# Patient Record
Sex: Female | Born: 1985 | Race: White | Hispanic: No | Marital: Married | Smoking: Never smoker
Health system: Southern US, Community
[De-identification: ages and names within clinical notes are randomized; demographics above are authoritative.]

## PROBLEM LIST (undated history)

## (undated) ENCOUNTER — Ambulatory Visit

## (undated) ENCOUNTER — Encounter

## (undated) ENCOUNTER — Ambulatory Visit: Payer: PRIVATE HEALTH INSURANCE

## (undated) ENCOUNTER — Encounter: Attending: Adult Health | Primary: Adult Health

## (undated) ENCOUNTER — Ambulatory Visit
Payer: PRIVATE HEALTH INSURANCE | Attending: Physical Medicine & Rehabilitation | Primary: Physical Medicine & Rehabilitation

## (undated) ENCOUNTER — Telehealth

## (undated) ENCOUNTER — Ambulatory Visit: Payer: PRIVATE HEALTH INSURANCE | Attending: Adult Health | Primary: Adult Health

## (undated) ENCOUNTER — Telehealth: Attending: Hematology & Oncology | Primary: Hematology & Oncology

## (undated) ENCOUNTER — Encounter: Attending: Hematology & Oncology | Primary: Hematology & Oncology

## (undated) ENCOUNTER — Ambulatory Visit: Payer: PRIVATE HEALTH INSURANCE | Attending: Hematology & Oncology | Primary: Hematology & Oncology

## (undated) ENCOUNTER — Telehealth: Attending: Adult Health | Primary: Adult Health

## (undated) ENCOUNTER — Ambulatory Visit: Attending: Internal Medicine | Primary: Internal Medicine

## (undated) ENCOUNTER — Inpatient Hospital Stay (HOSPITAL_COMMUNITY): Payer: Self-pay

## (undated) DIAGNOSIS — D649 Anemia, unspecified: Secondary | ICD-10-CM

## (undated) DIAGNOSIS — R51 Headache: Secondary | ICD-10-CM

## (undated) DIAGNOSIS — J302 Other seasonal allergic rhinitis: Secondary | ICD-10-CM

## (undated) DIAGNOSIS — N809 Endometriosis, unspecified: Secondary | ICD-10-CM

## (undated) DIAGNOSIS — N83209 Unspecified ovarian cyst, unspecified side: Secondary | ICD-10-CM

## (undated) DIAGNOSIS — R222 Localized swelling, mass and lump, trunk: Secondary | ICD-10-CM

## (undated) DIAGNOSIS — R519 Headache, unspecified: Secondary | ICD-10-CM

## (undated) DIAGNOSIS — IMO0001 Reserved for inherently not codable concepts without codable children: Secondary | ICD-10-CM

## (undated) HISTORY — PX: OTHER SURGICAL HISTORY: SHX169

---
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
## 2007-03-09 ENCOUNTER — Other Ambulatory Visit: Admission: RE | Admit: 2007-03-09 | Discharge: 2007-03-09 | Payer: Self-pay | Admitting: Family Medicine

## 2008-02-28 ENCOUNTER — Encounter: Admission: RE | Admit: 2008-02-28 | Discharge: 2008-02-28 | Payer: Self-pay | Admitting: Otolaryngology

## 2010-03-25 ENCOUNTER — Emergency Department (HOSPITAL_BASED_OUTPATIENT_CLINIC_OR_DEPARTMENT_OTHER): Admission: EM | Admit: 2010-03-25 | Discharge: 2010-03-25 | Payer: Self-pay | Admitting: Emergency Medicine

## 2010-12-14 LAB — PREGNANCY, URINE: Preg Test, Ur: NEGATIVE

## 2010-12-14 LAB — URINE MICROSCOPIC-ADD ON

## 2010-12-14 LAB — URINALYSIS, ROUTINE W REFLEX MICROSCOPIC
Bilirubin Urine: NEGATIVE
Nitrite: NEGATIVE
Specific Gravity, Urine: 1.02 (ref 1.005–1.030)
pH: 6 (ref 5.0–8.0)

## 2011-02-12 NOTE — Discharge Summary (Signed)
Mary Cannon, Mary Cannon                           ACCOUNT NO.:  [id number]   MEDICAL RECORD NO.:  [id number]                   PATIENT TYPE:  INP   LOCATION:  0104                                 FACILITY:  [facility]   PHYSICIAN:  Cindie Crumbly, M.D.               DATE OF BIRTH:  1986-09-08   DATE OF ADMISSION:  07/14/2002  DATE OF DISCHARGE:  07/20/2002                                 DISCHARGE SUMMARY   REASON FOR ADMISSION:  This 25 year old white female was admitted for  inpatient psychiatric stabilization status post overdose as a suicide  attempt.  For further history of present illness, please see the patient's  psychiatric admission assessment.   PHYSICAL EXAMINATION:  At the time of admission, was significant for her  being underweight and was, otherwise unremarkable.   LABORATORY DATA:  The patient underwent a laboratory workup to rule out any  medical problems contributing to her symptomatology.  A urine probe for  gonorrhea and Chlamydia were negative.  UA was unremarkable.  Routine chem  panel was within normal limits.  Hepatic panel was within normal limits.  GGT was within normal limits.  CBC showed RBC 3.60, MCV 95.3, MCHC 34.4 and  was, otherwise unremarkable.  TSH and free T4 were within normal limits.  RPR was nonreactive.   The patient received no x-rays, no special procedures, no additional  consultations.  She sustained no complications during the course of this  hospitalization.   HOSPITAL COURSE:  On admission, the patient was psychomotor agitated.  Affect and mood were depressed and irritable.  She displayed poor impulse  control, decreased concentration.  She was begun on a trial of Wellbutrin XL  at 150 mg per day and tolerated this medication well without side effects.  She has been actively participating in all aspects of the therapeutic  treatment program and, at the time of discharge, denies any suicidal or  homicidal ideation, no longer appears to be a  danger to herself or others  and consequently is felt to have reached her maximum benefits of  hospitalization and is ready for discharge to a less restrictive alternative  setting.   CONDITION ON DISCHARGE:  Improved.   DIAGNOSES (ACCORDING TO DSM-IV):   AXIS I:  1. Major depression, single episode, severe without psychosis.  2. 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   AXIS II:  Rule out learning disorder; not otherwise specified.   AXIS III:  None.   AXIS IV:  Severe.   AXIS V:  1. On admission = 20.  2. On discharge = 30.   FURTHER EVALUATION AND TREATMENT RECOMMENDATIONS:  1. The patient is discharged to home.  2. She is discharged on an unrestricted level of activity and a regular     diet.  3. She was discharged on Wellbutrin XL 150 mg p.o. q. a.m.  4. She will follow up with Dr. __________ at the  [facility] for all further aspects of her     psychiatric care and, consequently, I will sign off on the case at this     time.                                               Cindie Crumbly, M.D.    TS/MEDQ  D:  07/20/2002  T:  07/21/2002  Job:  [id number]

## 2011-03-24 ENCOUNTER — Inpatient Hospital Stay (HOSPITAL_COMMUNITY)
Admission: AD | Admit: 2011-03-24 | Discharge: 2011-03-24 | Disposition: A | Discharge status: Home / Self Care | Payer: 59 | Source: Ambulatory Visit | Attending: Obstetrics and Gynecology | Admitting: Obstetrics and Gynecology

## 2011-03-24 DIAGNOSIS — O209 Hemorrhage in early pregnancy, unspecified: Secondary | ICD-10-CM | POA: Insufficient documentation

## 2011-03-24 LAB — URINALYSIS, ROUTINE W REFLEX MICROSCOPIC
Glucose, UA: NEGATIVE mg/dL
Specific Gravity, Urine: 1.025 (ref 1.005–1.030)
Urobilinogen, UA: 0.2 mg/dL (ref 0.0–1.0)

## 2011-03-24 LAB — URINE MICROSCOPIC-ADD ON

## 2011-04-10 ENCOUNTER — Inpatient Hospital Stay (HOSPITAL_COMMUNITY)
Admission: AD | Admit: 2011-04-10 | Discharge: 2011-04-11 | Disposition: A | Discharge status: Home / Self Care | Payer: 59 | Source: Ambulatory Visit | Attending: Obstetrics and Gynecology | Admitting: Obstetrics and Gynecology

## 2011-04-10 ENCOUNTER — Encounter (HOSPITAL_COMMUNITY): Payer: Self-pay | Admitting: *Deleted

## 2011-04-10 DIAGNOSIS — O209 Hemorrhage in early pregnancy, unspecified: Secondary | ICD-10-CM

## 2011-04-10 DIAGNOSIS — Z3490 Encounter for supervision of normal pregnancy, unspecified, unspecified trimester: Secondary | ICD-10-CM

## 2011-04-10 NOTE — Progress Notes (Signed)
Pt G1 at 13wks having vag bleeding that started at 2145.  Bleeding started as bright red now dark red, no clots.  Pt having lower abd tenderness and reports constipation and gas.

## 2011-04-10 NOTE — ED Provider Notes (Signed)
History     Chief Complaint  Patient presents with  . Vaginal Bleeding   HPI G1P0 @ [redacted] wks EGA. Reports gush of vaginal bleeding tonight, has since decreased. Mild cramping, but reports that she has been having this pain with associated constipation. H/o subchorionic hemorrhage in this pregnancy, had similar episode about 2 weeks ago, has been on pelvic rest since.   OB History    Grav Para Term Preterm Abortions TAB SAB Ect Mult Living   1               History reviewed. No pertinent past medical history.  History reviewed. No pertinent past surgical history.  No family history on file.  History  Substance Use Topics  . Smoking status: Never Smoker   . Smokeless tobacco: Not on file  . Alcohol Use: No    Allergies: No Known Allergies  Prescriptions prior to admission  Medication Sig Dispense Refill  . ondansetron (ZOFRAN-ODT) 4 MG disintegrating tablet Take 4 mg by mouth every 8 (eight) hours as needed. nausea       . prenatal vitamin w/FE, FA (NATACHEW) 29-1 MG CHEW Chew 1 tablet by mouth daily.          Review of Systems  Constitutional: Negative.   Respiratory: Negative.   Cardiovascular: Negative.   Gastrointestinal: Positive for abdominal pain and constipation. Negative for diarrhea.  Genitourinary:       + for vaginal bleeding  Neurological: Negative.   Psychiatric/Behavioral: Negative.    Physical Exam   Blood pressure 124/70, pulse 112, temperature 99.2 F (37.3 C), temperature source Oral, resp. rate 16, height 5' 3.5" (1.613 m), weight 74.844 kg (165 lb), last menstrual period 01/09/2011. FHR 150s  Physical Exam  Constitutional: She appears well-developed and well-nourished. No distress.  Cardiovascular: Normal rate.   Respiratory: Effort normal.  GI: Soft. There is no tenderness.  Genitourinary: There is no rash or lesion on the right labia. There is no rash or lesion on the left labia. There is bleeding (moderate) around the vagina.       S=D,  Cervix closed    MAU Course  Procedures Prenatal labs rev'd in [facility] - blood type O pos  Consult with Dr. Rana Snare, d/c pt to home, rec rest, f/u as scheduled or sooner as needed.   A/P: 25 yo G1P0 @ 13 wks with vaginal bleeding. Has appt scheduled on 7/23, will call office or return to MAU with increased bleeding or pain.

## 2011-04-10 NOTE — Progress Notes (Signed)
Pt states, " I felt something run out at 9:30 pm, and I got back home and it was all down my legs and on my pants. M back has been hurting on and off since 6:30 pm, and I keep feeling like Im constipated and have a gasy  feeling in my low abd."

## 2011-04-11 DIAGNOSIS — O209 Hemorrhage in early pregnancy, unspecified: Secondary | ICD-10-CM | POA: Diagnosis present

## 2011-04-11 DIAGNOSIS — Z3490 Encounter for supervision of normal pregnancy, unspecified, unspecified trimester: Secondary | ICD-10-CM

## 2011-04-11 LAB — WET PREP, GENITAL

## 2011-04-13 LAB — GC/CHLAMYDIA PROBE AMP, GENITAL: Chlamydia, DNA Probe: NEGATIVE

## 2011-04-16 ENCOUNTER — Inpatient Hospital Stay (HOSPITAL_COMMUNITY)
Admission: AD | Admit: 2011-04-16 | Discharge: 2011-04-16 | Disposition: A | Discharge status: Home / Self Care | Payer: 59 | Source: Ambulatory Visit | Attending: Obstetrics and Gynecology | Admitting: Obstetrics and Gynecology

## 2011-04-16 ENCOUNTER — Encounter (HOSPITAL_COMMUNITY): Payer: Self-pay | Admitting: *Deleted

## 2011-04-16 ENCOUNTER — Other Ambulatory Visit (HOSPITAL_COMMUNITY): Payer: Self-pay | Admitting: Obstetrics and Gynecology

## 2011-04-16 DIAGNOSIS — O210 Mild hyperemesis gravidarum: Secondary | ICD-10-CM | POA: Insufficient documentation

## 2011-04-16 DIAGNOSIS — R1110 Vomiting, unspecified: Secondary | ICD-10-CM

## 2011-04-16 MED ORDER — ONDANSETRON HCL 4 MG/2ML IJ SOLN
8.0000 mg | Freq: Once | INTRAVENOUS | Status: AC
  Administered 2011-04-16: 8 mg via INTRAVENOUS
  Filled 2011-04-16: qty 4

## 2011-04-16 MED ORDER — ACETAMINOPHEN 325 MG PO TABS: 650.0000 mg | ORAL_TABLET | ORAL | Status: DC | PRN

## 2011-04-16 MED ORDER — PRENATAL PLUS 27-1 MG PO TABS
1.0000 | ORAL_TABLET | Freq: Every day | ORAL | Status: DC
  Filled 2011-04-16 (×2): qty 1

## 2011-04-16 MED ORDER — CALCIUM CARBONATE ANTACID 500 MG PO CHEW
2.0000 | CHEWABLE_TABLET | ORAL | Status: DC | PRN
  Filled 2011-04-16: qty 2

## 2011-04-16 MED ORDER — ZOLPIDEM TARTRATE 10 MG PO TABS: 10.0000 mg | ORAL_TABLET | Freq: Every evening | ORAL | Status: DC | PRN

## 2011-04-16 MED ORDER — ONDANSETRON HCL 4 MG/2ML IJ SOLN
8.0000 mg | Freq: Once | INTRAMUSCULAR | Status: DC
  Filled 2011-04-16: qty 4

## 2011-04-16 MED ORDER — DOCUSATE SODIUM 100 MG PO CAPS
100.0000 mg | ORAL_CAPSULE | Freq: Every day | ORAL | Status: DC
  Filled 2011-04-16 (×2): qty 1

## 2011-04-16 MED ORDER — LACTATED RINGERS IV SOLN: Freq: Once | INTRAVENOUS | Status: DC

## 2011-04-16 NOTE — Progress Notes (Signed)
Pt states that she is no longer nauseated, but every time she eats or drinks she will have vomiting. States she has been seen in MAU with bleeding over the past weekend.

## 2011-04-16 NOTE — ED Provider Notes (Addendum)
History   Patient of Dr Vance Gather. G1P0 now [redacted]w[redacted]d pregant.  With hx of Subchorionic hemorrhage with vaginal bleeding.  Had ultrasound to find out sex at another office in [place] and told her amniotic fluid was low.  She was seen by Dr. Marcelle Overlie today and had Ultrasound in the office.  States the baby was not moving a lot on the ultrasound. Was sent here for hydration.  Instructed to rest and that she would have weekly monitoring.  Has pink spotting.  Denies vomiting since here.  IV is now inserted to begin hydration.  Is comfortable and in no acute distress.  Chief Complaint  Patient presents with  . Emesis   Patient is a 25 y.o. female presenting with vomiting. The history is provided by the patient.  Emesis  This is a chronic problem. The current episode started more than 1 week ago. The problem occurs 2 to 4 times per day.      No past medical history on file.  No past surgical history on file.  No family history on file.  History  Substance Use Topics  . Smoking status: Never Smoker   . Smokeless tobacco: Not on file  . Alcohol Use: No    Allergies: No Known Allergies  Prescriptions prior to admission  Medication Sig Dispense Refill  . ondansetron (ZOFRAN-ODT) 4 MG disintegrating tablet Take 8 mg by mouth every 8 (eight) hours as needed. nausea      . polyethylene glycol (MIRALAX / GLYCOLAX) packet Take 17 g by mouth daily. For constipation       . prenatal vitamin w/FE, FA (NATACHEW) 29-1 MG CHEW Chew 1 tablet by mouth daily.          Review of Systems  Gastrointestinal: Positive for vomiting.   Physical Exam   Blood pressure 124/74, pulse 80, temperature 97.8 F (36.6 C), temperature source Oral, resp. rate 16, height 5\' 3"  (1.6 m), weight 160 lb (72.576 kg), last menstrual period 01/09/2011, SpO2 99.00%.  Physical Exam  MAU Course  Procedures   I liter of LR with Zofran infused.  Patient is doing well.   Neysa Bonito, Rn reported to Dr. Marcelle Overlie that fluids were in.  May  discharge to home.  MDM  Assessment and Plan  Emesis--treated with IV fluids  Discharge to home with instructions to keep scheduled appointment.  To call her doctor for increased nausea/vomiting, vaginal bleeding.  Continue Zofran that she has at home. Seeley Hissong,EVE M 04/16/2011, 3:31 PM   Matt Holmes, NP 05/01/11 1604

## 2011-04-16 NOTE — Progress Notes (Signed)
Pt states "N/V continues, office sent over for IV flds"

## 2011-04-20 ENCOUNTER — Inpatient Hospital Stay (HOSPITAL_COMMUNITY)
Admission: AD | Admit: 2011-04-20 | Discharge: 2011-04-20 | Disposition: A | Discharge status: Home / Self Care | Payer: 59 | Source: Ambulatory Visit | Attending: Obstetrics & Gynecology | Admitting: Obstetrics & Gynecology

## 2011-04-20 ENCOUNTER — Encounter (HOSPITAL_COMMUNITY): Payer: Self-pay | Admitting: Advanced Practice Midwife

## 2011-04-20 DIAGNOSIS — O209 Hemorrhage in early pregnancy, unspecified: Secondary | ICD-10-CM | POA: Insufficient documentation

## 2011-04-20 NOTE — Progress Notes (Signed)
Pt G1 at 14.5wks, using vag suppository for BV.  Brown blood noted on pad this am and something white and thick fell into the toilet.  Pt was instructed by phone nurse to come to MAU.

## 2011-04-20 NOTE — Progress Notes (Signed)
I went to Dr. Friday and was tested for BAV, started taking Clindamycin vaginal cream.  Used suppository and thought it was medicine.  Had brown dc this morning and lost white something with brown blood on it.

## 2011-04-20 NOTE — ED Provider Notes (Signed)
History     Chief Complaint  Patient presents with  . Vaginal Bleeding   HPI 25 y.o. G1P0 at [redacted]w[redacted]d. States she woke up this morning with brown discharge on pad. Went to bathroom and something white came out into toilet. Started using vaginal suppository treatment for BV last night. H/o vaginal bleeding in this pregnancy s/t subchorionic hemorrhage. Reports mild cramping feeling in inguinal area bilaterally.     Past Medical History  Diagnosis Date  . No pertinent past medical history     Past Surgical History  Procedure Date  . No past surgeries     History reviewed. No pertinent family history.  History  Substance Use Topics  . Smoking status: Never Smoker   . Smokeless tobacco: Not on file  . Alcohol Use: No    Allergies:  Allergies  Allergen Reactions  . Sulfa Antibiotics Other (See Comments)    Gets bad yeast infection    Prescriptions prior to admission  Medication Sig Dispense Refill  . clindamycin (CLEOCIN) 2 % vaginal cream Place 1 applicator vaginally at bedtime. For 3 days       . ondansetron (ZOFRAN-ODT) 4 MG disintegrating tablet Take 8 mg by mouth every 8 (eight) hours as needed. nausea      . polyethylene glycol (MIRALAX / GLYCOLAX) packet Take 17 g by mouth daily. For constipation       . prenatal vitamin w/FE, FA (NATACHEW) 29-1 MG CHEW Chew 1 tablet by mouth daily.          ROS Negative except as noted in HPI  Physical Exam   Blood pressure 116/77, pulse 101, temperature 98.8 F (37.1 C), temperature source Oral, resp. rate 16, height 5\' 4"  (1.626 m), weight 159 lb (72.122 kg), last menstrual period 01/09/2011.  Physical Exam  Constitutional: She appears well-developed and well-nourished. No distress.  Cardiovascular: Normal rate.   Respiratory: Effort normal.  GI: Soft. Bowel sounds are normal. She exhibits no distension. There is no tenderness.  Genitourinary: There is no rash or lesion on the right labia. There is no rash or lesion on the  left labia. No bleeding around the vagina. Vaginal discharge (brownish creamy discharge) found.       Cervix posterior, closed, firm    MAU Course  Procedures  Dr. Henderson Cloud informed of patient status, ok to d/c to home  Assessment and Plan  25 y.o. G1P0 [redacted]w[redacted]d Vaginal discharge related to admin of vaginal cream tonight F/U as scheduled or sooner PRN  Jadin Creque 04/19/2011, 5:42 AM

## 2011-04-23 ENCOUNTER — Other Ambulatory Visit (HOSPITAL_COMMUNITY): Payer: Self-pay | Admitting: Obstetrics and Gynecology

## 2011-04-23 DIAGNOSIS — Z3689 Encounter for other specified antenatal screening: Secondary | ICD-10-CM

## 2011-04-29 ENCOUNTER — Other Ambulatory Visit (HOSPITAL_COMMUNITY): Payer: Self-pay | Admitting: Obstetrics and Gynecology

## 2011-04-29 ENCOUNTER — Ambulatory Visit (HOSPITAL_COMMUNITY)
Admission: RE | Admit: 2011-04-29 | Discharge: 2011-04-29 | Disposition: A | Discharge status: Home / Self Care | Payer: 59 | Source: Ambulatory Visit | Attending: Obstetrics and Gynecology | Admitting: Obstetrics and Gynecology

## 2011-04-29 DIAGNOSIS — Z3689 Encounter for other specified antenatal screening: Secondary | ICD-10-CM

## 2011-04-29 DIAGNOSIS — O4100X0 Oligohydramnios, unspecified trimester, not applicable or unspecified: Secondary | ICD-10-CM | POA: Insufficient documentation

## 2011-04-29 DIAGNOSIS — O200 Threatened abortion: Secondary | ICD-10-CM | POA: Insufficient documentation

## 2011-04-29 NOTE — Progress Notes (Signed)
Report in [facility]; follow-up U/S 3 weeks

## 2011-05-20 ENCOUNTER — Ambulatory Visit (HOSPITAL_COMMUNITY)
Admission: RE | Admit: 2011-05-20 | Discharge: 2011-05-20 | Disposition: A | Discharge status: Home / Self Care | Payer: 59 | Source: Ambulatory Visit | Attending: Obstetrics and Gynecology | Admitting: Obstetrics and Gynecology

## 2011-05-20 ENCOUNTER — Other Ambulatory Visit (HOSPITAL_COMMUNITY): Payer: Self-pay | Admitting: Obstetrics and Gynecology

## 2011-05-20 DIAGNOSIS — O4100X0 Oligohydramnios, unspecified trimester, not applicable or unspecified: Secondary | ICD-10-CM

## 2011-05-20 DIAGNOSIS — Z3689 Encounter for other specified antenatal screening: Secondary | ICD-10-CM | POA: Insufficient documentation

## 2011-05-28 ENCOUNTER — Encounter (HOSPITAL_COMMUNITY): Payer: Self-pay

## 2011-05-28 ENCOUNTER — Inpatient Hospital Stay (HOSPITAL_COMMUNITY)
Admission: AD | Admit: 2011-05-28 | Discharge: 2011-05-28 | Disposition: A | Discharge status: Home / Self Care | Payer: 59 | Source: Ambulatory Visit | Attending: Obstetrics and Gynecology | Admitting: Obstetrics and Gynecology

## 2011-05-28 DIAGNOSIS — F411 Generalized anxiety disorder: Secondary | ICD-10-CM | POA: Insufficient documentation

## 2011-05-28 DIAGNOSIS — R300 Dysuria: Secondary | ICD-10-CM

## 2011-05-28 DIAGNOSIS — N390 Urinary tract infection, site not specified: Secondary | ICD-10-CM | POA: Insufficient documentation

## 2011-05-28 DIAGNOSIS — O99340 Other mental disorders complicating pregnancy, unspecified trimester: Secondary | ICD-10-CM | POA: Insufficient documentation

## 2011-05-28 DIAGNOSIS — O2390 Unspecified genitourinary tract infection in pregnancy, unspecified trimester: Secondary | ICD-10-CM | POA: Insufficient documentation

## 2011-05-28 DIAGNOSIS — O26899 Other specified pregnancy related conditions, unspecified trimester: Secondary | ICD-10-CM

## 2011-05-28 LAB — URINALYSIS, ROUTINE W REFLEX MICROSCOPIC
Bilirubin Urine: NEGATIVE
Hgb urine dipstick: NEGATIVE
Specific Gravity, Urine: <1.005 — ABNORMAL LOW (ref 1.005–1.030)
Urobilinogen, UA: 0.2 mg/dL (ref 0.0–1.0)
pH: 6.5 (ref 5.0–8.0)

## 2011-05-28 NOTE — Progress Notes (Signed)
Pt states, " I went to the  office this week( [facility])  and Dr Renaldo Fiddler put me on Macrobid for a UTI. I noticed yesterday that my symptoms increased and now Im burning when I urinate and slight frequency. I don't think the medicine is working. I've already had BV and have been leaking fluid and on bed rest."

## 2011-05-28 NOTE — Progress Notes (Signed)
Pt also reports vaginal irritation and low a low back ache since yesterday am

## 2011-05-28 NOTE — Progress Notes (Signed)
Pt being treated for uti, feels more s/s since abx started. Burning/urgency with voiding.

## 2011-05-28 NOTE — ED Provider Notes (Signed)
History   Pt presents today c/o worsening urinary sx. She states she was started on Macrobid on Monday for a UTI. However, she states her sx have worsened including urinary frequency and dysuria. She states she has also started to have some external vag irritation. She denies having a "big gush of fluid", fever, bleeding, or any other sx at this time.   Chief Complaint  Patient presents with  . Urinary Tract Infection   HPI  OB History    Grav Para Term Preterm Abortions TAB SAB Ect Mult Living   1               Past Medical History  Diagnosis Date  . No pertinent past medical history     Past Surgical History  Procedure Date  . No past surgeries     History reviewed. No pertinent family history.  History  Substance Use Topics  . Smoking status: Never Smoker   . Smokeless tobacco: Never Used  . Alcohol Use: No    Allergies:  Allergies  Allergen Reactions  . Sulfa Antibiotics Other (See Comments)    Gets bad yeast infection    Prescriptions prior to admission  Medication Sig Dispense Refill  . nitrofurantoin, macrocrystal-monohydrate, (MACROBID) 100 MG capsule Take 100 mg by mouth 2 (two) times daily. Patient will complete antibiotic on 05-29-11       . ondansetron (ZOFRAN-ODT) 4 MG disintegrating tablet Take 8 mg by mouth every 8 (eight) hours as needed. nausea      . prenatal vitamin w/FE, FA (NATACHEW) 29-1 MG CHEW Chew 1 tablet by mouth daily.        . clindamycin (CLEOCIN) 2 % vaginal cream Place 1 applicator vaginally at bedtime. For 3 days       . polyethylene glycol (MIRALAX / GLYCOLAX) packet Take 17 g by mouth daily. For constipation         Review of Systems  Constitutional: Negative for fever.  Cardiovascular: Negative for chest pain.  Gastrointestinal: Negative for nausea, vomiting, abdominal pain, diarrhea and constipation.  Genitourinary: Positive for dysuria and frequency. Negative for urgency, hematuria and flank pain.  Neurological: Negative for  dizziness and headaches.  Psychiatric/Behavioral: Negative for depression and suicidal ideas.   Physical Exam   Blood pressure 107/67, pulse 91, temperature 98.9 F (37.2 C), temperature source Oral, resp. rate 20, height 5' 3.75" (1.619 m), weight 163 lb 6 oz (74.106 kg), last menstrual period 01/09/2011.  Physical Exam  Constitutional: She is oriented to person, place, and time. She appears well-developed and well-nourished. No distress.  HENT:  Head: Normocephalic and atraumatic.  Eyes: EOM are normal. Pupils are equal, round, and reactive to light.  GI: Soft. She exhibits no distension. There is no tenderness. There is no rebound and no guarding.  Genitourinary: No bleeding around the vagina. Vaginal discharge found.       Cervix appears visually closed. No pooling noted. There is a creamy vag dc present. Digital exam was not performed.  Neurological: She is alert and oriented to person, place, and time.  Skin: Skin is warm and dry. She is not diaphoretic.  Psychiatric: She has a normal mood and affect. Her behavior is normal. Judgment and thought content normal.    MAU Course  Procedures  Wet prep and GC/Chlamydia cultures done.  Discussed pt at length with Dr. Marcelle Overlie.  Results for orders placed during the hospital encounter of 05/28/11 (from the past 24 hour(s))  URINALYSIS, ROUTINE W REFLEX MICROSCOPIC  Status: Abnormal   Collection Time   05/28/11  3:25 PM      Component Value Range   Color, Urine YELLOW  YELLOW    Appearance CLEAR  CLEAR    Specific Gravity, Urine <1.005 (*) 1.005 - 1.030    pH 6.5  5.0 - 8.0    Glucose, UA NEGATIVE  NEGATIVE (mg/dL)   Hgb urine dipstick NEGATIVE  NEGATIVE    Bilirubin Urine NEGATIVE  NEGATIVE    Ketones, ur NEGATIVE  NEGATIVE (mg/dL)   Protein, ur NEGATIVE  NEGATIVE (mg/dL)   Urobilinogen, UA 0.2  0.0 - 1.0 (mg/dL)   Nitrite NEGATIVE  NEGATIVE    Leukocytes, UA NEGATIVE  NEGATIVE   WET PREP, GENITAL     Status: Abnormal    Collection Time   05/28/11  3:54 PM      Component Value Range   Yeast, Wet Prep NONE SEEN  NONE SEEN    Trich, Wet Prep NONE SEEN  NONE SEEN    Clue Cells, Wet Prep NONE SEEN  NONE SEEN    WBC, Wet Prep HPF POC MANY (*) NONE SEEN      Assessment and Plan  Dysuria: discussed with pt at length. She will continue her macrobid and f/u with Dr. Dennie Bible office. Discussed diet, activity, risks, and precautions.  Clinton Gallant. Rice III, DrHSc, MPAS, PA-C  05/28/2011, 3:49 PM   Henrietta Hoover, PA 05/28/11 [phone]

## 2011-05-30 LAB — URINE CULTURE
Culture  Setup Time: 201209010041
Culture: NO GROWTH
Special Requests: NORMAL

## 2011-06-10 ENCOUNTER — Ambulatory Visit (HOSPITAL_COMMUNITY)
Admission: RE | Admit: 2011-06-10 | Discharge: 2011-06-10 | Disposition: A | Discharge status: Home / Self Care | Payer: 59 | Source: Ambulatory Visit | Attending: Obstetrics and Gynecology | Admitting: Obstetrics and Gynecology

## 2011-06-10 DIAGNOSIS — Z3689 Encounter for other specified antenatal screening: Secondary | ICD-10-CM | POA: Insufficient documentation

## 2011-06-10 DIAGNOSIS — O4100X0 Oligohydramnios, unspecified trimester, not applicable or unspecified: Secondary | ICD-10-CM

## 2011-06-21 ENCOUNTER — Encounter (HOSPITAL_COMMUNITY): Payer: Self-pay | Admitting: *Deleted

## 2011-06-21 ENCOUNTER — Inpatient Hospital Stay (HOSPITAL_COMMUNITY): Payer: 59

## 2011-06-21 ENCOUNTER — Inpatient Hospital Stay (HOSPITAL_COMMUNITY)
Admission: AD | Admit: 2011-06-21 | Discharge: 2011-07-19 | DRG: 765 | Disposition: A | Discharge status: Home / Self Care | Payer: 59 | Source: Ambulatory Visit | Attending: Obstetrics and Gynecology | Admitting: Obstetrics and Gynecology

## 2011-06-21 DIAGNOSIS — O468X9 Other antepartum hemorrhage, unspecified trimester: Secondary | ICD-10-CM | POA: Diagnosis present

## 2011-06-21 DIAGNOSIS — O42919 Preterm premature rupture of membranes, unspecified as to length of time between rupture and onset of labor, unspecified trimester: Secondary | ICD-10-CM | POA: Diagnosis present

## 2011-06-21 DIAGNOSIS — O4590 Premature separation of placenta, unspecified, unspecified trimester: Principal | ICD-10-CM | POA: Diagnosis present

## 2011-06-21 DIAGNOSIS — O4290 Premature rupture of membranes, unspecified as to length of time between rupture and onset of labor, unspecified weeks of gestation: Secondary | ICD-10-CM | POA: Diagnosis present

## 2011-06-21 DIAGNOSIS — O2690 Pregnancy related conditions, unspecified, unspecified trimester: Secondary | ICD-10-CM

## 2011-06-21 DIAGNOSIS — O209 Hemorrhage in early pregnancy, unspecified: Secondary | ICD-10-CM

## 2011-06-21 DIAGNOSIS — Z3490 Encounter for supervision of normal pregnancy, unspecified, unspecified trimester: Secondary | ICD-10-CM

## 2011-06-21 LAB — URINE MICROSCOPIC-ADD ON

## 2011-06-21 LAB — CBC
HCT: 32.5 % — ABNORMAL LOW (ref 36.0–46.0)
Hemoglobin: 11.1 g/dL — ABNORMAL LOW (ref 12.0–15.0)
MCV: 87.6 fL (ref 78.0–100.0)
RBC: 3.71 MIL/uL — ABNORMAL LOW (ref 3.87–5.11)
WBC: 17.1 10*3/uL — ABNORMAL HIGH (ref 4.0–10.5)

## 2011-06-21 LAB — URINALYSIS, ROUTINE W REFLEX MICROSCOPIC
Glucose, UA: NEGATIVE mg/dL
Leukocytes, UA: NEGATIVE
pH: 7 (ref 5.0–8.0)

## 2011-06-21 MED ORDER — CALCIUM CARBONATE ANTACID 500 MG PO CHEW: 2.0000 | CHEWABLE_TABLET | ORAL | Status: DC | PRN

## 2011-06-21 MED ORDER — BETAMETHASONE SOD PHOS & ACET 6 (3-3) MG/ML IJ SUSP
12.0000 mg | INTRAMUSCULAR | Status: DC
  Administered 2011-06-21: 12 mg via INTRAMUSCULAR
  Filled 2011-06-21 (×2): qty 2

## 2011-06-21 MED ORDER — PRENATAL PLUS 27-1 MG PO TABS: 1.0000 | ORAL_TABLET | Freq: Every day | ORAL | Status: DC

## 2011-06-21 MED ORDER — SODIUM CHLORIDE 0.9 % IV SOLN: 250.0000 mL | INTRAVENOUS | Status: DC

## 2011-06-21 MED ORDER — DOCUSATE SODIUM 100 MG PO CAPS
100.0000 mg | ORAL_CAPSULE | Freq: Every day | ORAL | Status: DC
  Administered 2011-06-21 – 2011-07-16 (×24): 100 mg via ORAL
  Filled 2011-06-21 (×25): qty 1

## 2011-06-21 MED ORDER — SODIUM CHLORIDE 0.9 % IJ SOLN: 3.0000 mL | INTRAMUSCULAR | Status: DC | PRN

## 2011-06-21 MED ORDER — SODIUM CHLORIDE 0.9 % IV SOLN
250.0000 mL | INTRAVENOUS | Status: DC
  Administered 2011-06-21 (×2): 1000 mL via INTRAVENOUS
  Administered 2011-06-22: 250 mL via INTRAVENOUS
  Administered 2011-06-22: 125 mL via INTRAVENOUS
  Administered 2011-06-23: 12:00:00 via INTRAVENOUS
  Administered 2011-06-23 (×2): 125 mL via INTRAVENOUS
  Administered 2011-06-24: 1000 mL via INTRAVENOUS

## 2011-06-21 MED ORDER — ACETAMINOPHEN 325 MG PO TABS
650.0000 mg | ORAL_TABLET | ORAL | Status: DC | PRN
  Administered 2011-07-02: 650 mg via ORAL
  Filled 2011-06-21: qty 2

## 2011-06-21 MED ORDER — SODIUM CHLORIDE 0.9 % IJ SOLN
3.0000 mL | Freq: Two times a day (BID) | INTRAMUSCULAR | Status: DC
  Administered 2011-06-26 – 2011-07-13 (×3): 3 mL via INTRAVENOUS

## 2011-06-21 MED ORDER — CLINDAMYCIN PHOSPHATE 900 MG/50ML IV SOLN
900.0000 mg | Freq: Three times a day (TID) | INTRAVENOUS | Status: DC
  Administered 2011-06-21 – 2011-06-22 (×5): 900 mg via INTRAVENOUS
  Filled 2011-06-21 (×6): qty 50

## 2011-06-21 MED ORDER — PRENATAL PLUS 27-1 MG PO TABS
1.0000 | ORAL_TABLET | Freq: Every day | ORAL | Status: DC
  Administered 2011-06-21 – 2011-07-16 (×7): 1 via ORAL
  Filled 2011-06-21 (×10): qty 1

## 2011-06-21 MED ORDER — COMPLETENATE 29-1 MG PO CHEW
1.0000 | CHEWABLE_TABLET | Freq: Every day | ORAL | Status: DC
  Administered 2011-06-22 – 2011-07-14 (×19): 1 via ORAL
  Filled 2011-06-21 (×27): qty 1

## 2011-06-21 MED ORDER — ZOLPIDEM TARTRATE 10 MG PO TABS
10.0000 mg | ORAL_TABLET | Freq: Every evening | ORAL | Status: DC | PRN
  Administered 2011-06-22 – 2011-06-28 (×6): 10 mg via ORAL
  Filled 2011-06-21 (×6): qty 1

## 2011-06-21 NOTE — Progress Notes (Signed)
RN called to room and pt c/o having a single pain lasting approx 3 min in her left lower abdoman, now feeling some discomfort in her lower left back. Pt also noted to have a quarter sized bright red blood on peripad.  No clots.

## 2011-06-21 NOTE — Progress Notes (Signed)
Dr. Vincente Poli informed of pt status, VB and pain in her lower left abdoman and lower back.  MD also made aware of FHR variables. Per MD continue to monitor continuously.

## 2011-06-21 NOTE — Progress Notes (Signed)
Pt sttes she is passing a small amt of bright red blood-first saw it at 0200-is wearing a pad-no actrive bleeding present-pt states that at 13 weeks and also had a placental abruption-was told her water broke and she had a placental seperation-since then it has reattached and the her water sealed over and is building back up again-she gets weekly [place] at the office and every 3 weeks at [facility]

## 2011-06-21 NOTE — Progress Notes (Signed)
New peripad placed

## 2011-06-21 NOTE — Progress Notes (Signed)
RN called to room and pt c/o sharp brief pain in her right lower abd area.  No new vaginal bleeding.

## 2011-06-21 NOTE — Progress Notes (Signed)
Patient is resting comfortably. History of PPROM since 13 weeks reviewed with the patient. She has been doing bedrest at home. This am, noted episode of bright red bleeding while urinating. No Pain.  Ultrasound Oligohydramnios Cervical length is 4.8 cm Large subchorionic hemorrhage noted in LUS 2.4 x 2.1 x 6.6 cm  IMP Iup at 23 2/7 Known subchorionic hemorrhage PPROM  PLAN Admit for observation Steroids Discuss with Dr. Rana Snare possible inpatient hospitalization now given viability. Patient reports infected left big toe - will give iv ancef today

## 2011-06-21 NOTE — Progress Notes (Signed)
Pt G1 at 23.2wks reports having 4 contractions during the day, now having bright red bleeding.  Pt reports PPROM at 13wks and placenta abruption at 13wks.  Pt has weekly U/S in the office and every three wks with Fetal Medicine.  Placent reattachment noted  A few weeks ago.

## 2011-06-21 NOTE — ED Provider Notes (Signed)
History     CSN: [id number] Arrival date & time: 06/21/2011  2:26 AM  Chief Complaint  Patient presents with  . Vaginal Bleeding  . Contractions      HPI Mary Cannon IS A 25 y.o.  Female @ [redacted] weeks gestation who presents to MAU for vaginal bleeding. She has been followed by Dr. Rana Snare and by MFM for a [facility] and leaking of fluid. She has been having weekly ultrasounds and is schedule for one in the office today with Dr. Rana Snare. When the bleeding started she decided no to wait until the appointment time. Dr. Henderson Cloud is on call tonight and called MAU regarding the patient. Ultrasound ordered.   Past Medical History  Diagnosis Date  . No pertinent past medical history     Past Surgical History  Procedure Date  . No past surgeries     No family history on file.  History  Substance Use Topics  . Smoking status: Never Smoker   . Smokeless tobacco: Never Used  . Alcohol Use: No    OB History    Grav Para Term Preterm Abortions TAB SAB Ect Mult Living   1               Review of Systems  Review of Systems  Gastrointestinal: Positive for abdominal pain.  Genitourinary: Positive for vaginal bleeding.       Pregnant    Allergies  Sulfa antibiotics  Home Medications  No current outpatient prescriptions on file.  Physical Exam    BP 122/82  Pulse 107  Temp 98.8 F (37.1 C)  Resp 18  Ht 5\' 4"  (1.626 m)  Wt 169 lb (76.658 kg)  BMI 29.01 kg/m2  LMP 01/09/2011  Physical Exam  Nursing note and vitals reviewed. Constitutional: She is oriented to person, place, and time. She appears well-developed and well-nourished.  HENT:  Head: Normocephalic.  Eyes: EOM are normal.  Neck: Neck supple.  Pulmonary/Chest: Effort normal.  Musculoskeletal: Normal range of motion.  Neurological: She is alert and oriented to person, place, and time. No cranial nerve deficit.   Ultrasound tonight has minimal changes since the ultrasound 4 days ago.  I discussed the results with Dr.  Henderson Cloud and he request that the patient be monitored for four hours and then Dr. Vincente Poli will evaluate the patient in MAU. Results for orders placed during the hospital encounter of 06/21/11 (from the past 24 hour(s))  URINALYSIS, ROUTINE W REFLEX MICROSCOPIC     Status: Abnormal   Collection Time   06/21/11  3:50 AM      Component Value Range   Color, Urine YELLOW  YELLOW    Appearance CLEAR  CLEAR    Specific Gravity, Urine <1.005 (*) 1.005 - 1.030    pH 7.0  5.0 - 8.0    Glucose, UA NEGATIVE  NEGATIVE (mg/dL)   Hgb urine dipstick TRACE (*) NEGATIVE    Bilirubin Urine NEGATIVE  NEGATIVE    Ketones, ur NEGATIVE  NEGATIVE (mg/dL)   Protein, ur NEGATIVE  NEGATIVE (mg/dL)   Urobilinogen, UA 0.2  0.0 - 1.0 (mg/dL)   Nitrite NEGATIVE  NEGATIVE    Leukocytes, UA NEGATIVE  NEGATIVE   URINE MICROSCOPIC-ADD ON     Status: Normal   Collection Time   06/21/11  3:50 AM      Component Value Range   Squamous Epithelial / LPF RARE  RARE    RBC / HPF 0-2  <3 (RBC/hpf)   Bacteria, UA  RARE  RARE     ED Course  Procedures        [place], [place] 06/21/11 [phone]

## 2011-06-22 ENCOUNTER — Inpatient Hospital Stay (HOSPITAL_COMMUNITY): Payer: 59

## 2011-06-22 MED ORDER — TERBUTALINE SULFATE 1 MG/ML IJ SOLN
0.2500 mg | Freq: Once | INTRAMUSCULAR | Status: DC
  Filled 2011-06-22 (×2): qty 1

## 2011-06-22 MED ORDER — AMOXICILLIN-POT CLAVULANATE ER 1000-62.5 MG PO TB12: 2.0000 | ORAL_TABLET | Freq: Two times a day (BID) | ORAL | Status: DC

## 2011-06-22 MED ORDER — DEXTROSE 5 % IV SOLN
250.0000 mg | INTRAVENOUS | Status: DC
  Filled 2011-06-22: qty 250

## 2011-06-22 MED ORDER — BETAMETHASONE SOD PHOS & ACET 6 (3-3) MG/ML IJ SUSP
12.5000 mg | Freq: Once | INTRAMUSCULAR | Status: DC
  Administered 2011-06-22: 12.5 mg via INTRAMUSCULAR

## 2011-06-22 MED ORDER — DEXTROSE 5 % IV SOLN
1.0000 g | Freq: Two times a day (BID) | INTRAVENOUS | Status: AC
  Administered 2011-06-22 – 2011-06-25 (×6): 1 g via INTRAVENOUS
  Filled 2011-06-22 (×6): qty 1

## 2011-06-22 MED ORDER — AMOXICILLIN-POT CLAVULANATE 875-125 MG PO TABS
1.0000 | ORAL_TABLET | Freq: Two times a day (BID) | ORAL | Status: AC
  Administered 2011-06-25 – 2011-06-29 (×8): 1 via ORAL
  Filled 2011-06-22 (×8): qty 1

## 2011-06-22 MED ORDER — DEXTROSE 5 % IV SOLN
250.0000 mg | INTRAVENOUS | Status: DC
  Administered 2011-06-22 – 2011-06-26 (×5): 250 mg via INTRAVENOUS
  Filled 2011-06-22 (×5): qty 250

## 2011-06-22 MED ORDER — BETAMETHASONE SOD PHOS & ACET 6 (3-3) MG/ML IJ SUSP
12.0000 mg | Freq: Once | INTRAMUSCULAR | Status: AC
  Administered 2011-06-22: 12 mg via INTRAMUSCULAR

## 2011-06-22 NOTE — Progress Notes (Addendum)
Pt without complaints GFM Bleeding this am  Was painless, dark, when she went to bathroom Denies Ctxs VSSAF FHR + Ctxs none Abd:  Soft, Gravid, nt  IUP at 23 3/7 Subchorionic hemorrhage/Chronic Abruption PPROM for last 4-6 weeks Admitted with vaginal bleeding Had a long discussion regarding plan of care/viablity/when to intervene for fetal distress/ management of PPROM and vaginal bleeding.  Also questions regarding Abx for latency, and when to give steroids Plan MFM and NICU consult today to discuss these issues today Will readdress later today DL

## 2011-06-22 NOTE — Progress Notes (Signed)
MFM Consultation Note  Ms. Mary Cannon is a 25 year old G1 Caucasian female at 23+3 weeks who was admitted yesterday for a small amount of vaginal bleeding. She is well known to the MFM service and we have been following her for PPROM and chronic abruption. Since at least 18 weeks, there has been significant oligohydramnios and a subchorionic hemorrhage visualized on ultrasound. Her last [place] here was on 09/13 which showed the EFW to be at the 27%tile with 2 small pockets of fluid. She has been at modified bedrest at home and doing well until last PM when she experience a small gush of bright red bleeding. Since admission, she has had a second small amount of bleeding.  On monitor, fetus looks surprisingly good with only a few small variables. No significant uterine activity. 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

## 2011-06-22 NOTE — Progress Notes (Signed)
I reviewed the MFM recommendations with the patient and all of her questions were answered. Plan IV abx for 3 days the switch to po x 1 week.  Zithromycin x 1 week. DL

## 2011-06-22 NOTE — Progress Notes (Signed)
UR Chart review completed.  

## 2011-06-22 NOTE — Consult Note (Addendum)
Neonatology Consult to Antenatal Patient:  Ms. Mary Cannon was admitted at 23 4/[redacted] weeks GA due to oligohydramnios and abruptio placenta and onset of vaginal bleeding. She first had gross ROM at about [redacted] weeks GA with reaccumulation of fluid since that time in quantity sufficient to support continued growth of the fetus and lung development. She has had a small abruption which has been stable, but she began to have bleeding and the abruption is now 6 cm in size. She is currently not having active labor. She is getting BMZ.  I spoke with this very pleasant patient, her husband and her mother. I let them know that, at 23-24 weeks, the decision about whether or not to attempt resuscitation is one to be made between themselves and their obstetrician and is not to be entered lightly as the longterm outcomes at this age are not very good. They have decided that they would want full resuscitative measures performed. We discussed the worst case of delivery in the next 1-2 days, including usual DR management, probable respiratory complications and need for support, IV access, feedings (mother plans to pump breast milk), LOS, Mortality and Morbidity, and long term outcomes. They had a number of questions which I answered. I offered a NICU tour to Mr. Mary Cannon and any interested family members and would be glad to come back if they have more questions later.  Thank you for asking me to see this patient.  Deatra James, MD Neonatologist  Time spent: [id number]

## 2011-06-23 NOTE — Plan of Care (Signed)
Problem: Consults Goal: Birthing Suites Patient Information Press F2 to bring up selections list   Pt < [redacted] weeks EGA     

## 2011-06-23 NOTE — Progress Notes (Signed)
25 yo G1 @ 23+4 wks admitted w/ PPROM  MFM recs reviewed  FWB -   + FM, + FHT.  [place] 06/10/11 w/ EFW 27%. S/p BMZ MWB -   No ctx or pain.  Scant bloody discharge.  AF, VSS.  Uterus - gravid, NT PPROM - continue Antibiotic protocol, bedrest. MOD -    Desires c-section for fetal distress.  vtx by last [place]

## 2011-06-24 MED ORDER — LACTATED RINGERS IV SOLN
INTRAVENOUS | Status: DC
  Administered 2011-06-24 – 2011-06-27 (×7): via INTRAVENOUS
  Administered 2011-06-27: 125 mL/h via INTRAVENOUS
  Administered 2011-06-27 – 2011-07-03 (×15): via INTRAVENOUS
  Administered 2011-07-04: 1000 mL via INTRAVENOUS
  Administered 2011-07-04 – 2011-07-10 (×10): via INTRAVENOUS
  Administered 2011-07-10 – 2011-07-11 (×3): 1000 mL via INTRAVENOUS
  Administered 2011-07-12 – 2011-07-17 (×9): via INTRAVENOUS

## 2011-06-24 NOTE — Progress Notes (Signed)
23 5/7weeks  Some bleeding on/off No heavy cramping  VSS Afeb Ut soft/NT Vtx on last U/S FHT  +  A/P:Chronic PPROM/Abruption        Pt states C/S for distress-understands it could be vertical uterine incision        Cont Atb per protocol

## 2011-06-25 LAB — CULTURE, BETA STREP (GROUP B ONLY)

## 2011-06-25 NOTE — Progress Notes (Signed)
RN called to room, pt stating that she was "bleeding again". RN to room to evaluate, scant smear of peach colored discharge noted on pad.  Pt stating that she had some mucous she noted after voiding.

## 2011-06-25 NOTE — Progress Notes (Signed)
  S:  NO ACTIVE BLEEDING THIS AM FETAL MOVEMENT NOTED O:  AFEBRILE VSS       GRAVID UTERUS NONTENDER        FHR LAST PM 140"S OCCESSIONAL MILD VARIABLE  MOSTLY REASSURING A: 23 6/7 WITH PROM AND SUBCHORIONIC HEMORRHAGE P:  CONTINUE BEDREST MONITOR Q SHIFT

## 2011-06-25 NOTE — Progress Notes (Signed)
UR Chart review completed.  

## 2011-06-26 NOTE — Progress Notes (Signed)
  S:  MINIMAL SPOTTING GOOD FM O:  AF VSS       GRAVID UTERUS NONTENDER         + FHT A:  IUP AT 24 WEEKS WITH PROM AND SUBCHORIONIC HEM P:  CONTINUE BEDREST  MONITOR Q SHIFT

## 2011-06-26 NOTE — Progress Notes (Signed)
Monitors off per orders 

## 2011-06-27 MED ORDER — AZITHROMYCIN 250 MG PO TABS
250.0000 mg | ORAL_TABLET | Freq: Every day | ORAL | Status: DC
  Administered 2011-06-27 – 2011-07-04 (×8): 250 mg via ORAL
  Filled 2011-06-27 (×9): qty 1

## 2011-06-27 NOTE — Progress Notes (Signed)
Pt c/o some lower mild abdominal pain "like cramping" and some pressure.  Monitors applied to abdomen.

## 2011-06-27 NOTE — Progress Notes (Signed)
S:  MINIMAL SPOTTING GOOD FM O:  AF VSS       GRAVID UTERUS NONTENDER       FHR 150"S NO DECELS NO CTXS  A:  IUP AT 24 1/7 WITH PROM  AND SUBCHORIONIC HEM P:  BEDREST

## 2011-06-27 NOTE — Progress Notes (Signed)
The patient is receiving azithromycin by the intravenous route.  Based on criteria approved by the Pharmacy and Therapeutics Committee and the Medical Executive Committee, the medication is being converted to the equivalent oral dose form.  These criteria include: -No Active GI bleeding -Able to tolerate diet of full liquids (or better) or tube feeding -Able to tolerate other medications by the oral or enteral route  If you have any questions about this conversion, please contact the Pharmacy Department (ext [phone]).  Thank you.    Pt is Day 4 of azithromycin; afebrile; on augmentin po - will change to po per P&T protocol.

## 2011-06-28 NOTE — Progress Notes (Signed)
Patient is resting Reports good FM Denies abnormal vag discharge.  AF VSS Uterus is non tender  Imp IUP at 24 w 2 days PPROM  Plan Continue azithromycin Plan is to continue in hospital treatment

## 2011-06-28 NOTE — Progress Notes (Signed)
Admitted at  23 2/[redacted] weeks gestation, now 24 2/7 weeks, with PROM.  Height  64" Weight 170 lbs pre-pregnancy weight 171 lbs .Pre-pregnancy  BMI 29.3  IBW 120 lbs  Total weight gain none. Weight gain goals 15-25 lbs.   Estimated needs: 18-2000 kcal/day, 66-76 grams protein/day, 2 liters fluid/day regular diet tolerated well, appetite good. Reports no nausea Pt has Hx of morning sickness, now resolved, with a 10 lb net weight loss at 13 weeks No abnormal nutrition related labs Pt oriented to alternate menu items available, due to predicted LOS. Will change diet order to antenatal regular to allow snacks.  Nutrition Dx: Increased nutrient needs r/t pregnancy and fetal growth requirements aeb [redacted] weeks gestation.  No educational needs assessed at this time.

## 2011-06-28 NOTE — Progress Notes (Signed)
UR chart review completed.  

## 2011-06-29 NOTE — H&P (Signed)
25 yo G1 P0 @ 23 weeks presents with vaginal bleeding.  Followed by MFM for PPROM and [facility].  PMH/PSH/OB/SH/FH - see MAU note and prenatal H&P  PE  VSS Afeb U/S unchanged with low AFI and SCH  A: Chronic SCH and PPROM now periviable  P: Admit for further evaluation

## 2011-06-29 NOTE — Progress Notes (Signed)
No complaints.  Continues to have dark red/brownish discharge w/ small clots.  No ctx or pain  Af, VSS Gen - NAD Abd - gravid, NT Ext - no edema Cvx - deferred  A/P: PPROM  Continue hospital bedrest, antibiotics S/p steroids.

## 2011-06-30 NOTE — Progress Notes (Signed)
24 4/7 weeks  Small amt dark brown D/C , No BRB/cramps  VSS Afeb Fundus NT FHT +  A: PPROM and SCH P: finsih Azithromycin     Continue BR

## 2011-07-01 ENCOUNTER — Inpatient Hospital Stay (HOSPITAL_COMMUNITY): Payer: 59

## 2011-07-01 ENCOUNTER — Encounter (HOSPITAL_COMMUNITY): Payer: 59

## 2011-07-01 MED ORDER — HYDRALAZINE HCL 20 MG/ML IJ SOLN
INTRAMUSCULAR | Status: AC
  Filled 2011-07-01: qty 1

## 2011-07-01 NOTE — Progress Notes (Signed)
UR Chart review completed.  

## 2011-07-01 NOTE — Progress Notes (Signed)
Pt without complaints BPP 6/8  -2 fluid Nl EFW [place] with MFM  MFM recomends beginning twice weekly NSTs Will schedule on mon/thur DL

## 2011-07-01 NOTE — Progress Notes (Signed)
Pt without complaints No bleeding or leaking VSSAF FHR 140s Ctx rare Abd Gravid nt  PPROM at 24 5/7 Stable.  Continue in hospital management S/P abx Vtx presentation [place] for EFW today DL

## 2011-07-02 LAB — CBC
HCT: 31.6 % — ABNORMAL LOW (ref 36.0–46.0)
Hemoglobin: 10.6 g/dL — ABNORMAL LOW (ref 12.0–15.0)
MCH: 30 pg (ref 26.0–34.0)
MCHC: 33.5 g/dL (ref 30.0–36.0)
RDW: 14.7 % (ref 11.5–15.5)

## 2011-07-02 LAB — DIFFERENTIAL
Basophils Absolute: 0 10*3/uL (ref 0.0–0.1)
Basophils Relative: 0 % (ref 0–1)
Eosinophils Absolute: 0.2 10*3/uL (ref 0.0–0.7)
Lymphs Abs: 3 10*3/uL (ref 0.7–4.0)
Monocytes Absolute: 1.2 10*3/uL — ABNORMAL HIGH (ref 0.1–1.0)
Neutro Abs: 12.2 10*3/uL — ABNORMAL HIGH (ref 1.7–7.7)

## 2011-07-02 MED ORDER — HYDROCODONE-ACETAMINOPHEN 5-325 MG PO TABS
1.0000 | ORAL_TABLET | Freq: Four times a day (QID) | ORAL | Status: DC | PRN
  Administered 2011-07-02: 1 via ORAL
  Filled 2011-07-02: qty 1

## 2011-07-02 NOTE — Progress Notes (Signed)
Patient is resting this am No complaints Reports Good FM  AF VSS FHR is good  Uterus is nontender  IMP PPROM 24 6/7  Plan Continue bedrest  Biweekly NST Continue Azithromycin Questions answered at the bedside

## 2011-07-03 NOTE — Progress Notes (Signed)
No complaints Good FM No contractions  AF VSS Uterus is non tender  IMP PPROM at 25 weeks  PLAN Continue bedrest, biweekly NST, Azithromycin

## 2011-07-04 NOTE — Progress Notes (Signed)
Patient is resting. Noting some scant tan discharge when voiding. Reports good fetal movement. Afebrile  Vital Signs stable  Uterus is non tender No vaginal bleeding  Impression: PPROM  Plan: Continue in hospital stay BPP every Monday and Thursday Continue Azithromycin Growth scan in 2 1/2 weeks

## 2011-07-05 ENCOUNTER — Inpatient Hospital Stay (HOSPITAL_COMMUNITY): Payer: 59

## 2011-07-05 NOTE — Progress Notes (Signed)
  S:  NO CHANGE A: AF VSS     GRAVID UTERUS NONTENDER      MINIMAL SPOTTING      FHR 140 150 NO DECELS  O:  IUP 25 2/7 PROM SCH P:  BPP TODAY BEDREST

## 2011-07-05 NOTE — Progress Notes (Signed)
UR chart review completed.  

## 2011-07-06 NOTE — Progress Notes (Signed)
Pt crying while on the phone, upset over her food order this morning.  Pt calming down after reassurring  her and offering more options for meals.

## 2011-07-06 NOTE — Progress Notes (Signed)
25 3/7 weeks  No leaking/bleeding, scant yellow d/c Vss Afeb Ut soft, NT FHT + BPP yest 6/8 Stable

## 2011-07-07 NOTE — Progress Notes (Signed)
Pt called out to report vag bleeding.  Pt has about and inch worth or BRB on pad.  New pad replaced and pt placed on monitor.  Assessing

## 2011-07-07 NOTE — Progress Notes (Signed)
Pt stating that she smells an odor to her vaginal discharge (slightly yellow in color).  Pt afebrile and non tender abdoman.  Per Dr. Renaldo Fiddler we will watch at this time for fever, tenderness.

## 2011-07-07 NOTE — Progress Notes (Signed)
No c/o.  Good FM  Af, VSS Gen - NAD ABd - gravid, NT Ext - no edema PV - deferred  A/P:  25+4 wks w/ PPROM. Continue current plan.  1hr glucose test at 26wks

## 2011-07-08 ENCOUNTER — Inpatient Hospital Stay (HOSPITAL_COMMUNITY): Payer: 59

## 2011-07-08 NOTE — Progress Notes (Signed)
Ultrasound in AS/OBGYN/EPIC.  Follow up U/S scheduled 

## 2011-07-08 NOTE — Progress Notes (Signed)
Patient is noticing some changes today She has noticed more vaginal bleeding with voiding and some increased tenderness in her suprapubic region. She also has noticed a slight increase in vag odor. Exam Maternal pulse is normal FHR is 140s to 150s Abdomen is soft  Minimal tenderness lower part of uterus Pad - now scant yellow drainage on pad  IMP pprom at 25 5/7 Possible developing chorioamnionitis  PLAN Ultrasound now with MFM I will consult with MFM about threshold for delivery and then discuss plan with the patient

## 2011-07-09 NOTE — Progress Notes (Signed)
25 6/ 7 w/ PPROM  Dr Stephannie Li [place] note read.    Today, pt reports only min DC with irreg ctx.  Afeb/ fundus nonder  Discussed poss of evolving amnionitis and role of CS.

## 2011-07-09 NOTE — Progress Notes (Signed)
UR Chart review completed.  

## 2011-07-10 LAB — CBC
MCH: 30.3 pg (ref 26.0–34.0)
MCHC: 34 g/dL (ref 30.0–36.0)
Platelets: 192 10*3/uL (ref 150–400)
RDW: 14.5 % (ref 11.5–15.5)

## 2011-07-10 NOTE — Progress Notes (Signed)
Pt reports thick mucousy yellow discharge with 'fishy' odor when wiping, slight pelvic pressure with occasional sharp pains, low back pain, lower abdomen tender to palpation, afebrile.  Will report to Dr. Marcelle Overlie.  Cheral Marker

## 2011-07-10 NOTE — Progress Notes (Signed)
26 0/7...  Pt c/o some incr vag DC and lower back pain.  FHR stable and she remains afebrile.  Will check CBC, discussed CS if  Advanced labor or def amnionitis.  Would hold further ABX until dx of amnionitis

## 2011-07-11 NOTE — Progress Notes (Signed)
Subjective:  26 1/7 weeks Patient reports  Still irreg ctx + mild lower back pain, min vag D/C  Objective: I have reviewed patient's vital signs, medications and labs.  Abd soft/nontender   Assessment/Plan: Stable PPROM>>>cont BPP 2X/week  LOS: 20 days    Mary Cannon M 07/11/2011, 9:40 AM

## 2011-07-12 ENCOUNTER — Ambulatory Visit (HOSPITAL_COMMUNITY): Payer: 59

## 2011-07-12 NOTE — Progress Notes (Signed)
26 2/7 weeks  Pt in U/S  VSS Afeb FHT with accels Occasional UC

## 2011-07-13 ENCOUNTER — Encounter (HOSPITAL_COMMUNITY): Payer: Self-pay | Admitting: Anesthesiology

## 2011-07-13 LAB — DIFFERENTIAL
Blasts: 0 %
Lymphocytes Relative: 22 % (ref 12–46)
Metamyelocytes Relative: 0 %
Monocytes Absolute: 0.4 10*3/uL (ref 0.1–1.0)
Monocytes Relative: 3 % (ref 3–12)
Neutro Abs: 10.9 10*3/uL — ABNORMAL HIGH (ref 1.7–7.7)
Neutrophils Relative %: 70 % (ref 43–77)
nRBC: 0 /100 WBC

## 2011-07-13 LAB — CBC
MCV: 89.7 fL (ref 78.0–100.0)
Platelets: 185 10*3/uL (ref 150–400)
RDW: 14.3 % (ref 11.5–15.5)
WBC: 14.5 10*3/uL — ABNORMAL HIGH (ref 4.0–10.5)

## 2011-07-13 MED ORDER — TERBUTALINE SULFATE 1 MG/ML IJ SOLN
0.2500 mg | Freq: Once | INTRAMUSCULAR | Status: AC
  Administered 2011-07-13: 0.25 mg via SUBCUTANEOUS

## 2011-07-13 MED ORDER — BUTORPHANOL TARTRATE 2 MG/ML IJ SOLN
1.0000 mg | INTRAMUSCULAR | Status: DC | PRN
  Administered 2011-07-15: 1 mg via INTRAVENOUS
  Filled 2011-07-13: qty 1

## 2011-07-13 MED ORDER — MAGNESIUM SULFATE BOLUS VIA INFUSION
4.0000 g | Freq: Once | INTRAVENOUS | Status: AC
  Administered 2011-07-13: 4 g via INTRAVENOUS
  Filled 2011-07-13: qty 500

## 2011-07-13 MED ORDER — MAGNESIUM SULFATE 40 G IN LACTATED RINGERS - SIMPLE
2.0000 g/h | INTRAVENOUS | Status: AC
  Administered 2011-07-13: 2 g/h via INTRAVENOUS
  Filled 2011-07-13: qty 500

## 2011-07-13 MED ORDER — OXYCODONE-ACETAMINOPHEN 5-325 MG PO TABS: 1.0000 | ORAL_TABLET | ORAL | Status: DC | PRN

## 2011-07-13 NOTE — Progress Notes (Signed)
UR Chart review completed.  

## 2011-07-13 NOTE — Progress Notes (Signed)
  Pt complaint of gush of blood And some increasing ctxs  VSSAF Abd gravid nt, soft  FHR 150s occas varible mod reactive Ctxs occasional  Cx:  Sterile exam  CL/Th/-3 No active bleeding seen  PPROM/vaginal bleeding  Stable:  Discussed plan with pt Anesthesia aware.  Dr Jean Rosenthal spoke with patient Discussed plan of care with Dr Rachel Bo (MFM).  She also recommended Mgso4 4g then 2g/h x 12 for CP prophylaxis if pt is stable.  Could repeat this anytime before 32 weeks if we think delivery is imminent 28 weeks Repeat BMZ series DL

## 2011-07-13 NOTE — Progress Notes (Signed)
Pt complains of back pain this am.  Constant and midline GFM Some increase in vaginal d/c now more pink in nature Ctxs this am responded to one terb sq FHR 140 - 150s  Occasional variable decel VSSAF WBC 14.5 Abd Gravid, nt soft Minimal bleeding or d/c noted  Oligo/PPROM at 26 3/7 Reviewed plan of care with patient Continuous monitoring for now Will allow labor to progress if labor C/S for distress FHR reassuring at this time DL

## 2011-07-13 NOTE — Anesthesia Preprocedure Evaluation (Deleted)
Anesthesia Evaluation  Name, MR# and DOB Patient awake  General Assessment Comment  Reviewed: Allergy & Precautions, H&P , Patient's Chart, lab work & pertinent test results, reviewed documented beta blocker date and time   History of Anesthesia Complications Negative for: history of anesthetic complications  Airway Mallampati: II TM Distance: >3 FB Neck ROM: full    Dental No notable dental hx.    Pulmonary  clear to auscultation  Pulmonary exam normal       Cardiovascular Exercise Tolerance: Good regular Normal    Neuro/Psych Negative Neurological ROS  Negative Psych ROS   GI/Hepatic negative GI ROS Neg liver ROS    Endo/Other  Negative Endocrine ROS  Renal/GU negative Renal ROS     Musculoskeletal   Abdominal   Peds  Hematology negative hematology ROS (+)   Anesthesia Other Findings Gush of blood on 10/16.  Hemodynamically stable.  Discussed anesthesia regional versus general in emergent or elective setting.  Reproductive/Obstetrics negative OB ROS                           Anesthesia Physical Anesthesia Plan  ASA: II  Anesthesia Plan: General and Epidural   Post-op Pain Management:    Induction:   Airway Management Planned:   Additional Equipment:   Intra-op Plan:   Post-operative Plan:   Informed Consent: I have reviewed the patients History and Physical, chart, labs and discussed the procedure including the risks, benefits and alternatives for the proposed anesthesia with the patient or authorized representative who has indicated his/her understanding and acceptance.   Dental Advisory Given  Plan Discussed with: CRNA and Surgeon  Anesthesia Plan Comments:         Anesthesia Quick Evaluation

## 2011-07-14 NOTE — Progress Notes (Signed)
Pt. Off monitor with her nurse Stevenson Clinch - RN) at the bedside; monitor alerts placed on hold.

## 2011-07-14 NOTE — Progress Notes (Signed)
S:  MINIMAL BLEEDING GOOD FM O:  AF VSS       GRAVID UTERUS NONTENDER      FHR NO DECEL SOME ACCELS A:  IUP AT 26 4/7 W PROM P:  EXP MANAGEMENT

## 2011-07-14 NOTE — Progress Notes (Signed)
C/O chest pain, stating that it feels like her heart is beating too fast.  Pulse ox on no arrythmia auscultated.

## 2011-07-15 ENCOUNTER — Ambulatory Visit (HOSPITAL_COMMUNITY): Payer: 59

## 2011-07-15 ENCOUNTER — Inpatient Hospital Stay (HOSPITAL_COMMUNITY): Payer: 59

## 2011-07-15 MED ORDER — CITRIC ACID-SODIUM CITRATE 334-500 MG/5ML PO SOLN
ORAL | Status: AC
  Filled 2011-07-15: qty 15

## 2011-07-15 MED ORDER — TERBUTALINE SULFATE 1 MG/ML IJ SOLN
0.2500 mg | Freq: Once | INTRAMUSCULAR | Status: AC
  Administered 2011-07-15: 0.25 mg via SUBCUTANEOUS

## 2011-07-15 NOTE — Progress Notes (Signed)
Phone discussion just now with Dr. Sherrie George re pt's repeat BPP. Fetus did have breathing, just not sustained.  Dr. Sherrie George rec. Cont EFM like we are doing, and continue BPP 2 X week

## 2011-07-15 NOTE — Progress Notes (Signed)
26 5/7 weeks, getting ready to got to [place] for BPP  Minimal ctx, no new complaints.  Cont obsv, FHR stable

## 2011-07-16 ENCOUNTER — Inpatient Hospital Stay (HOSPITAL_COMMUNITY): Payer: 59

## 2011-07-16 LAB — CBC
HCT: 35 % — ABNORMAL LOW (ref 36.0–46.0)
Hemoglobin: 11.7 g/dL — ABNORMAL LOW (ref 12.0–15.0)
MCH: 30.2 pg (ref 26.0–34.0)
MCV: 90.4 fL (ref 78.0–100.0)
RBC: 3.87 MIL/uL (ref 3.87–5.11)

## 2011-07-16 LAB — DIFFERENTIAL
Eosinophils Absolute: 0.1 10*3/uL (ref 0.0–0.7)
Eosinophils Relative: 1 % (ref 0–5)
Lymphs Abs: 1.9 10*3/uL (ref 0.7–4.0)
Monocytes Absolute: 0.8 10*3/uL (ref 0.1–1.0)
Monocytes Relative: 6 % (ref 3–12)

## 2011-07-16 NOTE — Consult Note (Signed)
Neonatology Consult to Antenatal Patient: Follow-up  Ms. Mary Cannon is now at 26 6/[redacted]  weeks GA. She is currently having occasional contractions. BPP was 4/8 yesterday but 6/8 today. I first spoke with this patient at 78 4/7 weeks, at which time there was SROM. Serial ultrasound has shown small pockets of amniotic fluid, perhaps sufficient to support continued lung development.  I spoke with the patient, her husband, and several family members. We discussed the worst case of delivery in the next 1-2 days, including usual DR management, possible respiratory complications and need for support, IV access, feedings (encouraged pumping for breast milk if possible), LOS, Mortality and Morbidity, and long term outcomes. I let them know that there is still a chance that there has not been much lung development subsequent to 23.5 weeks, in which case we would know within the first hours of life that his lungs were not sufficiently developed to support life. I do think it is likely that is not the case, but cannot be certain until after birth.  I emphasized that, if the baby's and mother's well-being is felt to be adequate by her Obstetrician, further in utero maturation would still be preferable to delivery at this time. I would be glad to come back later if she or her husband has more questions later.  Thank you for asking me to see this patient.  Mary James, MD Neonatologist  Time spent: [phone]

## 2011-07-16 NOTE — Progress Notes (Signed)
Pt complains of increased frequency of contractions Some increase in vaginal leaking and bloody streaks  VSSAF FHR 140s  occas varible, reactive Ctxs 5-20 min  Mild  Abd Gravid nontender  PPROM at 63 6/7 Normal BPP this am Discussed plan of care with patient and her family They Desire C/S for delivery when decision made for delivery Normal CBC, Afebile and nt - no evidence of Chorioaminitis Continue expectant management NICU attending has discussed and answered patients questions regarding post delivery care DL

## 2011-07-17 ENCOUNTER — Encounter (HOSPITAL_COMMUNITY): Payer: Self-pay | Admitting: Anesthesiology

## 2011-07-17 ENCOUNTER — Inpatient Hospital Stay (HOSPITAL_COMMUNITY): Payer: 59 | Admitting: Anesthesiology

## 2011-07-17 ENCOUNTER — Encounter (HOSPITAL_COMMUNITY)
Admission: AD | Disposition: A | Discharge status: Home / Self Care | Payer: Self-pay | Source: Ambulatory Visit | Attending: Obstetrics and Gynecology

## 2011-07-17 ENCOUNTER — Other Ambulatory Visit: Payer: Self-pay | Admitting: Obstetrics and Gynecology

## 2011-07-17 ENCOUNTER — Encounter (HOSPITAL_COMMUNITY): Payer: Self-pay | Admitting: *Deleted

## 2011-07-17 SURGERY — Surgical Case
Anesthesia: Regional | Site: Abdomen

## 2011-07-17 MED ORDER — SIMETHICONE 80 MG PO CHEW
80.0000 mg | CHEWABLE_TABLET | Freq: Three times a day (TID) | ORAL | Status: DC
  Administered 2011-07-17 – 2011-07-18 (×6): 80 mg via ORAL

## 2011-07-17 MED ORDER — LACTATED RINGERS IV SOLN
INTRAVENOUS | Status: DC
  Administered 2011-07-17 – 2011-07-18 (×2): via INTRAVENOUS

## 2011-07-17 MED ORDER — DIPHENHYDRAMINE HCL 50 MG/ML IJ SOLN: 12.5000 mg | INTRAMUSCULAR | Status: DC | PRN

## 2011-07-17 MED ORDER — LANOLIN HYDROUS EX OINT: 1.0000 "application " | TOPICAL_OINTMENT | CUTANEOUS | Status: DC | PRN

## 2011-07-17 MED ORDER — SODIUM CHLORIDE 0.9 % IJ SOLN
3.0000 mL | INTRAMUSCULAR | Status: DC | PRN
  Administered 2011-07-18: 3 mL via INTRAVENOUS

## 2011-07-17 MED ORDER — SODIUM CHLORIDE 0.9 % IV SOLN: 1.0000 ug/kg/h | INTRAVENOUS | Status: DC | PRN

## 2011-07-17 MED ORDER — DIBUCAINE 1 % RE OINT: 1.0000 "application " | TOPICAL_OINTMENT | RECTAL | Status: DC | PRN

## 2011-07-17 MED ORDER — ONDANSETRON HCL 4 MG/2ML IJ SOLN
INTRAMUSCULAR | Status: AC
  Filled 2011-07-17: qty 2

## 2011-07-17 MED ORDER — ONDANSETRON HCL 4 MG/2ML IJ SOLN: 4.0000 mg | INTRAMUSCULAR | Status: DC | PRN

## 2011-07-17 MED ORDER — SCOPOLAMINE 1 MG/3DAYS TD PT72
1.0000 | MEDICATED_PATCH | Freq: Once | TRANSDERMAL | Status: DC
  Administered 2011-07-17: 1.5 mg via TRANSDERMAL

## 2011-07-17 MED ORDER — ZOLPIDEM TARTRATE 5 MG PO TABS: 5.0000 mg | ORAL_TABLET | Freq: Every evening | ORAL | Status: DC | PRN

## 2011-07-17 MED ORDER — EPHEDRINE 5 MG/ML INJ
INTRAVENOUS | Status: AC
  Filled 2011-07-17: qty 10

## 2011-07-17 MED ORDER — NALOXONE HCL 0.4 MG/ML IJ SOLN: 0.4000 mg | INTRAMUSCULAR | Status: DC | PRN

## 2011-07-17 MED ORDER — NALBUPHINE HCL 10 MG/ML IJ SOLN: 5.0000 mg | INTRAMUSCULAR | Status: DC | PRN

## 2011-07-17 MED ORDER — DIPHENHYDRAMINE HCL 25 MG PO CAPS: 25.0000 mg | ORAL_CAPSULE | ORAL | Status: DC | PRN

## 2011-07-17 MED ORDER — MORPHINE SULFATE (PF) 0.5 MG/ML IJ SOLN
INTRAMUSCULAR | Status: DC | PRN
  Administered 2011-07-17: 350 ug via INTRAVENOUS
  Administered 2011-07-17: 500 ug via EPIDURAL

## 2011-07-17 MED ORDER — DEXTROSE 5 % IV SOLN
1.0000 g | INTRAVENOUS | Status: AC
  Administered 2011-07-17: 1 g via INTRAVENOUS
  Filled 2011-07-17: qty 1

## 2011-07-17 MED ORDER — IBUPROFEN 600 MG PO TABS
600.0000 mg | ORAL_TABLET | Freq: Four times a day (QID) | ORAL | Status: DC
  Administered 2011-07-17 – 2011-07-19 (×6): 600 mg via ORAL
  Filled 2011-07-17 (×6): qty 1

## 2011-07-17 MED ORDER — SENNOSIDES-DOCUSATE SODIUM 8.6-50 MG PO TABS
2.0000 | ORAL_TABLET | Freq: Every day | ORAL | Status: DC
  Administered 2011-07-17 – 2011-07-18 (×2): 2 via ORAL

## 2011-07-17 MED ORDER — FENTANYL CITRATE 0.05 MG/ML IJ SOLN
INTRAMUSCULAR | Status: DC | PRN
  Administered 2011-07-17: 20 ug via INTRATHECAL

## 2011-07-17 MED ORDER — PRENATAL PLUS 27-1 MG PO TABS
1.0000 | ORAL_TABLET | Freq: Every day | ORAL | Status: DC
  Administered 2011-07-18: 1 via ORAL
  Filled 2011-07-17 (×2): qty 1

## 2011-07-17 MED ORDER — KETOROLAC TROMETHAMINE 30 MG/ML IJ SOLN: 30.0000 mg | Freq: Four times a day (QID) | INTRAMUSCULAR | Status: AC | PRN

## 2011-07-17 MED ORDER — WITCH HAZEL-GLYCERIN EX PADS: 1.0000 "application " | MEDICATED_PAD | CUTANEOUS | Status: DC | PRN

## 2011-07-17 MED ORDER — EPHEDRINE SULFATE 50 MG/ML IJ SOLN
INTRAMUSCULAR | Status: DC | PRN
  Administered 2011-07-17 (×3): 10 mg via INTRAVENOUS

## 2011-07-17 MED ORDER — PHENYLEPHRINE 40 MCG/ML (10ML) SYRINGE FOR IV PUSH (FOR BLOOD PRESSURE SUPPORT)
PREFILLED_SYRINGE | INTRAVENOUS | Status: AC
  Filled 2011-07-17: qty 5

## 2011-07-17 MED ORDER — ONDANSETRON HCL 4 MG PO TABS: 4.0000 mg | ORAL_TABLET | ORAL | Status: DC | PRN

## 2011-07-17 MED ORDER — FENTANYL CITRATE 0.05 MG/ML IJ SOLN
INTRAMUSCULAR | Status: AC
  Filled 2011-07-17: qty 2

## 2011-07-17 MED ORDER — SCOPOLAMINE 1 MG/3DAYS TD PT72
MEDICATED_PATCH | TRANSDERMAL | Status: AC
  Administered 2011-07-17: 1.5 mg via TRANSDERMAL
  Filled 2011-07-17: qty 1

## 2011-07-17 MED ORDER — MORPHINE SULFATE (PF) 0.5 MG/ML IJ SOLN
INTRAMUSCULAR | Status: DC | PRN
Start: 2011-07-17 — End: 2011-07-17
  Administered 2011-07-17: 150 ug via INTRATHECAL

## 2011-07-17 MED ORDER — METOCLOPRAMIDE HCL 5 MG/ML IJ SOLN: 10.0000 mg | Freq: Three times a day (TID) | INTRAMUSCULAR | Status: DC | PRN

## 2011-07-17 MED ORDER — CITRIC ACID-SODIUM CITRATE 334-500 MG/5ML PO SOLN
ORAL | Status: AC
  Administered 2011-07-17: 09:00:00
  Filled 2011-07-17: qty 15

## 2011-07-17 MED ORDER — OXYTOCIN 10 UNIT/ML IJ SOLN
20.0000 [IU] | INTRAVENOUS | Status: DC | PRN
  Administered 2011-07-17: 20 [IU] via INTRAVENOUS

## 2011-07-17 MED ORDER — DIPHENHYDRAMINE HCL 25 MG PO CAPS: 25.0000 mg | ORAL_CAPSULE | Freq: Four times a day (QID) | ORAL | Status: DC | PRN

## 2011-07-17 MED ORDER — IBUPROFEN 600 MG PO TABS: 600.0000 mg | ORAL_TABLET | Freq: Four times a day (QID) | ORAL | Status: DC | PRN

## 2011-07-17 MED ORDER — SIMETHICONE 80 MG PO CHEW: 80.0000 mg | CHEWABLE_TABLET | ORAL | Status: DC | PRN

## 2011-07-17 MED ORDER — ONDANSETRON HCL 4 MG/2ML IJ SOLN
INTRAMUSCULAR | Status: DC | PRN
  Administered 2011-07-17: 4 mg via INTRAVENOUS

## 2011-07-17 MED ORDER — MENTHOL 3 MG MT LOZG: 1.0000 | LOZENGE | OROMUCOSAL | Status: DC | PRN

## 2011-07-17 MED ORDER — KETOROLAC TROMETHAMINE 60 MG/2ML IM SOLN
INTRAMUSCULAR | Status: AC
  Filled 2011-07-17: qty 2

## 2011-07-17 MED ORDER — KETOROLAC TROMETHAMINE 60 MG/2ML IM SOLN
60.0000 mg | Freq: Once | INTRAMUSCULAR | Status: AC | PRN
  Administered 2011-07-17: 60 mg via INTRAMUSCULAR

## 2011-07-17 MED ORDER — FENTANYL CITRATE 0.05 MG/ML IJ SOLN
INTRAMUSCULAR | Status: DC | PRN
  Administered 2011-07-17: 30 ug via INTRAVENOUS
  Administered 2011-07-17: 50 ug via INTRAVENOUS

## 2011-07-17 MED ORDER — OXYTOCIN 20 UNITS IN LACTATED RINGERS INFUSION - SIMPLE
125.0000 mL/h | INTRAVENOUS | Status: AC
  Administered 2011-07-17: 125 mL/h via INTRAVENOUS
  Filled 2011-07-17: qty 1000

## 2011-07-17 MED ORDER — MEPERIDINE HCL 25 MG/ML IJ SOLN
6.2500 mg | INTRAMUSCULAR | Status: DC | PRN
Start: 2011-07-17 — End: 2011-07-17

## 2011-07-17 MED ORDER — MORPHINE SULFATE 0.5 MG/ML IJ SOLN
INTRAMUSCULAR | Status: AC
  Filled 2011-07-17: qty 10

## 2011-07-17 MED ORDER — OXYCODONE-ACETAMINOPHEN 5-325 MG PO TABS
1.0000 | ORAL_TABLET | ORAL | Status: DC | PRN
  Administered 2011-07-18 – 2011-07-19 (×5): 1 via ORAL
  Filled 2011-07-17 (×5): qty 1

## 2011-07-17 MED ORDER — TETANUS-DIPHTH-ACELL PERTUSSIS 5-2.5-18.5 LF-MCG/0.5 IM SUSP
0.5000 mL | Freq: Once | INTRAMUSCULAR | Status: DC
  Filled 2011-07-17: qty 0.5

## 2011-07-17 MED ORDER — DIPHENHYDRAMINE HCL 50 MG/ML IJ SOLN: 25.0000 mg | INTRAMUSCULAR | Status: DC | PRN

## 2011-07-17 MED ORDER — DEXTROSE 5 % IV SOLN
1.0000 g | Freq: Two times a day (BID) | INTRAVENOUS | Status: AC
  Administered 2011-07-17 – 2011-07-18 (×3): 1 g via INTRAVENOUS
  Filled 2011-07-17 (×3): qty 1

## 2011-07-17 MED ORDER — OXYTOCIN 10 UNIT/ML IJ SOLN
INTRAMUSCULAR | Status: AC
  Filled 2011-07-17: qty 4

## 2011-07-17 MED ORDER — PHENYLEPHRINE HCL 10 MG/ML IJ SOLN
INTRAMUSCULAR | Status: DC | PRN
  Administered 2011-07-17 (×3): 5 ug via INTRAVENOUS

## 2011-07-17 MED ORDER — ONDANSETRON HCL 4 MG/2ML IJ SOLN: 4.0000 mg | Freq: Three times a day (TID) | INTRAMUSCULAR | Status: DC | PRN

## 2011-07-17 SURGICAL SUPPLY — 27 items
CLOTH BEACON ORANGE TIMEOUT ST (SAFETY) ×2 IMPLANT
DRESSING TELFA 8X3 (GAUZE/BANDAGES/DRESSINGS) IMPLANT
DRSG COVADERM 4X6 (GAUZE/BANDAGES/DRESSINGS) ×2 IMPLANT
ELECT REM PT RETURN 9FT ADLT (ELECTROSURGICAL) ×2
ELECTRODE REM PT RTRN 9FT ADLT (ELECTROSURGICAL) ×1 IMPLANT
EXTRACTOR VACUUM M CUP 4 TUBE (SUCTIONS) IMPLANT
GAUZE SPONGE 4X4 12PLY STRL LF (GAUZE/BANDAGES/DRESSINGS) IMPLANT
GLOVE BIO SURGEON STRL SZ8 (GLOVE) ×2 IMPLANT
GLOVE SURG ORTHO 8.0 STRL STRW (GLOVE) ×2 IMPLANT
GOWN PREVENTION PLUS LG XLONG (DISPOSABLE) ×4 IMPLANT
GOWN STRL REIN XL XLG (GOWN DISPOSABLE) ×2 IMPLANT
KIT ABG SYR 3ML LUER SLIP (SYRINGE) ×2 IMPLANT
NEEDLE HYPO 25X5/8 SAFETYGLIDE (NEEDLE) ×2 IMPLANT
NS IRRIG 1000ML POUR BTL (IV SOLUTION) ×2 IMPLANT
PACK C SECTION WH (CUSTOM PROCEDURE TRAY) ×2 IMPLANT
PAD ABD 7.5X8 STRL (GAUZE/BANDAGES/DRESSINGS) IMPLANT
SLEEVE SCD COMPRESS KNEE MED (MISCELLANEOUS) IMPLANT
STAPLER VISISTAT 35W (STAPLE) IMPLANT
SUT MNCRL 0 VIOLET CTX 36 (SUTURE) ×3 IMPLANT
SUT MONOCRYL 0 CTX 36 (SUTURE) ×3
SUT PDS AB 1 CT  36 (SUTURE)
SUT PDS AB 1 CT 36 (SUTURE) IMPLANT
SUT VIC AB 1 CTX 36 (SUTURE)
SUT VIC AB 1 CTX36XBRD ANBCTRL (SUTURE) IMPLANT
TOWEL OR 17X24 6PK STRL BLUE (TOWEL DISPOSABLE) ×4 IMPLANT
TRAY FOLEY CATH 14FR (SET/KITS/TRAYS/PACK) ×2 IMPLANT
WATER STERILE IRR 1000ML POUR (IV SOLUTION) ×2 IMPLANT

## 2011-07-17 NOTE — Progress Notes (Signed)
RN called to room, pt waking to VB saturating 3/4 of a pad.  Pt stating that she is having ocassional discomfort in lower abd.  VSS.

## 2011-07-17 NOTE — Anesthesia Postprocedure Evaluation (Signed)
  Anesthesia Post-op Note  Patient: Mary Cannon  Procedure(s) Performed:  CESAREAN SECTION  Patient is awake, responsive, moving her legs, and has signs of resolution of her numbness. Pain and nausea are reasonably well controlled. Vital signs are stable and clinically acceptable. Oxygen saturation is clinically acceptable. There are no apparent anesthetic complications at this time. Patient is ready for discharge.

## 2011-07-17 NOTE — Consult Note (Signed)
Neonatology Note:  Attendance at C-section:  I was asked to attend this urgent primary C/S at 27 0/7 weeks due to abruptio placenta and fetal distress. The mother is a G1P0 O pos, GBS neg with PPROM and leaking amniotic fluid since 13 weeks. There has been consistent oligohydramnios, but not complete lack of fluid. She has been hospitalized since 23 weeks and received 2 doses of BMZ on 9/24-25, antibiotics and terbutaline. She has been monitored closely and ultrasounds have shown small pockets of amniotic fluid, swallowing, and bladder filling. His BPP was 4/8 on 10/18, 6/8 on 10/19. This morning, there was a 5-min FHR bradycardia and onset of vaginal bleeding. An urgent c/s was done. Fluid clear, no foul smell. Infant delivered vertex and had some muscle tone, but no spont cry. We bulb suctioned for bright red blood in the mouth, then dried him and placed him into the portawarmer bag for temp support. He was given stimulation and PPV with a bag and mask for about 1.5 minutes to open the lungs. He was making resp effort and his HR was above 100 throughout. A pulse oximeter was placed and his O2 saturations increased steadily with bagging. At about 3.5 minutes, we changed to neopuff. Breath sounds were fair, HR was maintained and color and perfusion were good with O2 sats about 87%. The baby was seen briefly by the parents in the DR, then transported to NICU on the neopuff in good condition. Moderate sternal retractions were noted. The father attended the baby to the NICU. Ap 4/7/8.  Bianca Raneri, MD  

## 2011-07-17 NOTE — Progress Notes (Signed)
Called to see patient for prolonged fetal bradycardia x 5 mintues repsonded to position change Now with Bright red bleeding saturating pads VSSAF FHR now 140s with accels Ctxs q 2-5 minutes Abd soft, nt  PPROM at 27 Aburptio placenta and repetative fetal decels (bradycardia) Plan primary LSTCS Risks and benefits discussed.  Informed consent obtained NICU notified DL

## 2011-07-17 NOTE — Op Note (Signed)
Cesarean Section Procedure Note  Pre-operative Diagnosis: IUP at 27 0/7, PPROM, Abruptio Placenta, Repetative fetal bradycardia  Post-operative Diagnosis: same  Surgeon: Turner Daniels   Assistants: none  Anesthesia:Spinal  Procedure:  Low Segment Transverse cesarean section  Procedure Details  The patient was seen in the Holding Room. The risks, benefits, complications, treatment options, and expected outcomes were discussed with the patient.  The patient concurred with the proposed plan, giving informed consent.  The site of surgery properly noted/marked.. A Time Out was held and the above information confirmed.  After induction of anesthesia, the patient was draped and prepped in the usual sterile manner. A Pfannenstiel incision was made and carried down through the subcutaneous tissue to the fascia. Fascial incision was made and extended transversely. The fascia was separated from the underlying rectus tissue superiorly and inferiorly. The peritoneum was identified and entered. Peritoneal incision was extended longitudinally. The utero-vesical peritoneal reflection was incised transversely and the bladder flap was bluntly freed from the lower uterine segment. A low transverse uterine incision was made. Delivered from vertex presentation was a viable female resusitated by the NICU team and taken to the NICU in good condition. After the umbilical cord was clamped and cut cord blood was obtained for evaluation. The placenta was removed intact with large retroplacental clot and couvellier uterus noted, consistent with abruption. The uterine outline, tubes and ovaries appeared normal. The uterine incision was closed with running locked sutures of 0 monocryl and imbricated with 0 monocryl. Hemostasis was observed. Lavage was carried out until clear. The peritoneum was then closed with 0 monocryl and rectus muscles plicated in the midline.  After hemostasis was assured, the fascia was then reapproximated  with running sutures of 0 Vicryl. Irrigation was applied and after adequate hemostasis was assured, the skin was reapproximated with staples.  Instrument, sponge, and needle counts were correct prior the abdominal closure and at the conclusion of the case. The patient received cefotetan preoperatively.  Findings: Viable female fetus to NICU, ph art pending  Estimated Blood Loss:  700cc         Specimens: Placenta was sent to pathology         Complications:  None

## 2011-07-17 NOTE — Anesthesia Preprocedure Evaluation (Signed)
Anesthesia Evaluation  Name, MR# and DOB Patient awake  General Assessment Comment  Reviewed: Allergy & Precautions, H&P , Patient's Chart, lab work & pertinent test results, reviewed documented beta blocker date and time   History of Anesthesia Complications Negative for: history of anesthetic complications  Airway Mallampati: II TM Distance: >3 FB Neck ROM: full    Dental No notable dental hx.    Pulmonary  clear to auscultation  Pulmonary exam normal       Cardiovascular Exercise Tolerance: Good regular Normal    Neuro/Psych Negative Neurological ROS  Negative Psych ROS   GI/Hepatic negative GI ROS Neg liver ROS    Endo/Other  Negative Endocrine ROS  Renal/GU negative Renal ROS     Musculoskeletal   Abdominal   Peds  Hematology negative hematology ROS (+)   Anesthesia Other Findings Gush of blood on 10/16.  Hemodynamically stable.  Discussed anesthesia regional versus general in emergent or elective setting.  Reproductive/Obstetrics negative OB ROS                           Anesthesia Physical Anesthesia Plan  ASA: II  Anesthesia Plan: Spinal   Post-op Pain Management:    Induction:   Airway Management Planned:   Additional Equipment:   Intra-op Plan:   Post-operative Plan:   Informed Consent: I have reviewed the patients History and Physical, chart, labs and discussed the procedure including the risks, benefits and alternatives for the proposed anesthesia with the patient or authorized representative who has indicated his/her understanding and acceptance.   Dental Advisory Given  Plan Discussed with: CRNA  Anesthesia Plan Comments: (Lab work confirmed with CRNA in room. Platelets okay. Discussed spinal anesthetic, and patient consents to the procedure:  included risk of possible headache,backache, failed block, allergic reaction, and nerve injury. This patient was asked  if she had any questions or concerns before the procedure started. )        Anesthesia Quick Evaluation

## 2011-07-17 NOTE — Progress Notes (Signed)
Abd shave

## 2011-07-17 NOTE — Transfer of Care (Signed)
Immediate Anesthesia Transfer of Care Note  Patient: Mary Cannon  Procedure(s) Performed:  CESAREAN SECTION  Patient Location: PACU  Anesthesia Type: Spinal  Level of Consciousness: awake, alert  and oriented  Airway & Oxygen Therapy: Patient Spontanous Breathing  Post-op Assessment: Report given to PACU RN and Post -op Vital signs reviewed and stable  Post vital signs: stable  Complications: No apparent anesthesia complications

## 2011-07-17 NOTE — Anesthesia Procedure Notes (Addendum)
Spinal Block  Patient location during procedure: OR Preanesthetic Checklist Completed: patient identified, site marked, surgical consent, pre-op evaluation, timeout performed, IV checked, risks and benefits discussed and monitors and equipment checked Spinal Block Patient position: sitting Prep: DuraPrep Patient monitoring: heart rate, cardiac monitor, continuous pulse ox and blood pressure Approach: midline Location: L3-4 Injection technique: single-shot Needle Needle type: Sprotte  Needle gauge: 24 G Needle length: 9 cm Assessment Sensory level: T4 Additional Notes Spinal Dosage in OR  Bupivicaine ml       1.6 PFMS04   mcg        150 Fentanyl mcg            20    

## 2011-07-18 LAB — CBC
MCH: 30.3 pg (ref 26.0–34.0)
MCV: 90.3 fL (ref 78.0–100.0)
Platelets: 170 10*3/uL (ref 150–400)
RBC: 2.9 MIL/uL — ABNORMAL LOW (ref 3.87–5.11)
RDW: 14.3 % (ref 11.5–15.5)
WBC: 14.2 10*3/uL — ABNORMAL HIGH (ref 4.0–10.5)

## 2011-07-18 NOTE — Progress Notes (Signed)
Attempted to see pt today, pt asked CSW to return at a later time.  Will have weekday CSW follow up with pt.

## 2011-07-18 NOTE — Progress Notes (Signed)
Pt had difficulty with walking and weakness today VSSAF  Inc CD&I  Encouraged to stay until stronger.  Recheck CBC in am Appropriately grieving DL

## 2011-07-18 NOTE — Progress Notes (Signed)
Patient took shower this afternoon and removed dressing. RN called to room because patient felt very lightheaded after her shower. I found her sitting in a chair, catching her breath. Patient finished getting dressed in the chair and stated that "she felt fine when she was sitting but felt lightheaded when standing". Orthostatic vital signs taken (please see vital doc flowsheet in epic). Patient was left in her bed, and told to call if she needed anything. Will continue to monitor.

## 2011-07-18 NOTE — Plan of Care (Signed)
Problem: Phase II Progression Outcomes Goal: Pain controlled on oral analgesia Outcome: Progressing Has only needed po Motrin for pain control Goal: Progress activity as tolerated unless otherwise ordered Outcome: Progressing Patient walked entire hallway with use of Stedy and tolerated this very well. Goal: Incision intact & without signs/symptoms of infection Outcome: Not Applicable Date Met:  07/18/11 Pressure dressing in place and it is dry and intact Goal: Tolerating diet Outcome: Completed/Met Date Met:  07/18/11 Patient is eating a regular diet and doing well  Problem: Discharge Progression Outcomes Goal: Barriers To Progression Addressed/Resolved Outcome: Progressing Providing emotional support due to infant in NICU. Goal: Activity appropriate for discharge plan Outcome: Progressing Patient ambulates well.

## 2011-07-18 NOTE — Progress Notes (Addendum)
Subjective: Postpartum Day 1: Cesarean Delivery Patient reports tolerating PO and no problems voiding.   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  The patient and her family are grieving appropriately and are hoping for early discharge home. Objective: Vital signs in last 24 hours: Physical Exam:  General: alert, cooperative and no distress Lochia: appropriate Uterine Fundus: firm Incision: bandage dry DVT Evaluation: No evidence of DVT seen on physical exam.      Assessment/Plan: Status post Cesarean section. Doing well postoperatively.  I had a long discussion with the patient and her family this morning regarding Olvier's death and the patient's post op health.  We will advance her diet and activity today and d/c her abx.  If she continues to do well we could consider d/c home this evening.  Deshunda Thackston C 04/10/2011, 10:23 AM

## 2011-07-19 ENCOUNTER — Encounter (HOSPITAL_COMMUNITY): Payer: Self-pay | Admitting: Obstetrics and Gynecology

## 2011-07-19 ENCOUNTER — Ambulatory Visit (HOSPITAL_COMMUNITY): Payer: 59

## 2011-07-19 LAB — CBC
HCT: 25.4 % — ABNORMAL LOW (ref 36.0–46.0)
Hemoglobin: 8.6 g/dL — ABNORMAL LOW (ref 12.0–15.0)
MCHC: 33.9 g/dL (ref 30.0–36.0)
RDW: 14.4 % (ref 11.5–15.5)
WBC: 10.8 10*3/uL — ABNORMAL HIGH (ref 4.0–10.5)

## 2011-07-19 MED ORDER — OXYCODONE-ACETAMINOPHEN 5-500 MG PO CAPS: 1.0000 | ORAL_CAPSULE | ORAL | Status: AC | PRN

## 2011-07-19 NOTE — Progress Notes (Signed)
UR chart review completed.  

## 2011-07-19 NOTE — Discharge Summary (Signed)
Admitting diagnosis: Intrauterine pregnancy at 23 weeks. Premature rupture of membranes since 13 weeks. Subchorionic hemorrhage. Active bleeding.  Discharge diagnosis: The same. Subsequent active bleeding at 27 weeks with fetal bradycardia leading to a low transverse cesarean section.  Operative procedure: Low transverse cesarean section.  History and physical: Please see dictated note  Course in the hospital: Patient was admitted at 23 weeks with increasing bleeding. She was placed at bedrest. Maternal-fetal medicine consult was obtained. Time was to keep the patient in the hospital until delivery was indicated. She did receive betamethasone at 24 weeks. She started physical profiles on a biweekly basis. These were initially doing well. Last week she did have a biophysical profile for over 8. Followup was improved. Subsequently on Saturday morning and October 19 the patient had increasing bleeding. Fetal heart rate bradycardia was noted. Emergent cesarean section was performed. A viable female was delivered Apgars were 4, 7 and 8. The baby subsequently expired the to hypoplastic lungs. Postoperatively mother did well. He can be discharged home on her second postoperative day. At this time she was afebrile with stable vital signs was tolerating a regular diet. She was ambulating without difficulty. Incision was intact with staples present. The fundus was firm and nontender. Lochia was normal.  She is discharged home in stable condition  Complications as noted above  Disposition: Patient be discharged home. She'll followup in the office this week for staple removal. She is to avoid heavy lifting, vaginal entrance, or driving a car. She is to call with signs of infection: Heavy bleeding: Or increasing pain. Structures are as outlined in our postpartum instruction pamphlet. Discharge home intolerances she needs it for pain and iron sulfate supplementation.

## 2011-07-19 NOTE — Plan of Care (Signed)
Problem: Discharge Progression Outcomes Goal: Remove staples per MD order Outcome: Not Applicable Date Met:  07/19/11 Patient has appt. Scheduled Wednesday at Dr. Isidore Moos to get staples removed.

## 2011-07-19 NOTE — Progress Notes (Signed)
Subjective: Postpartum Day two Cesarean Delivery Patient reports tolerating PO.    Objective: Vital signs in last 24 hours: Temp:  [98.1 F (36.7 C)-98.5 F (36.9 C)] 98.4 F (36.9 C) (10/22 0601) Pulse Rate:  [86-141] 86  (10/22 0601) Resp:  [12-18] 18  (10/22 0601) BP: (91-106)/(61-71) 93/61 mmHg (10/22 0601) SpO2:  [98 %-99 %] 98 % (10/22 0601)  Physical Exam:  General: alert Lochia: appropriate Uterine Fundus: firm Incision: healing well DVT Evaluation: No evidence of DVT seen on physical exam.   Basename 07/19/11 0525 07/18/11 0527  HGB 8.6* 8.8*  HCT 25.4* 26.2*    Assessment/Plan: Status post Cesarean section. Doing well postoperatively.  Discharge home with standard precautions and return to office this week for removal of staples.Marland Kitchen  Wahneta Derocher S 07/19/2011, 8:16 AM

## 2011-07-20 ENCOUNTER — Ambulatory Visit (HOSPITAL_COMMUNITY): Payer: 59

## 2011-07-20 ENCOUNTER — Other Ambulatory Visit (HOSPITAL_COMMUNITY): Payer: 59

## 2011-07-22 ENCOUNTER — Ambulatory Visit (HOSPITAL_COMMUNITY): Payer: 59

## 2012-10-18 IMAGING — US US FETAL BPP W/O NONSTRESS
1 series · 14 of 28 positions shown · non-contrast
Comparison: none

[Series 1: us fetal bpp w/o nonstress · 0.21mm/px · 14 of 32 slices shown]
[im 2/32]
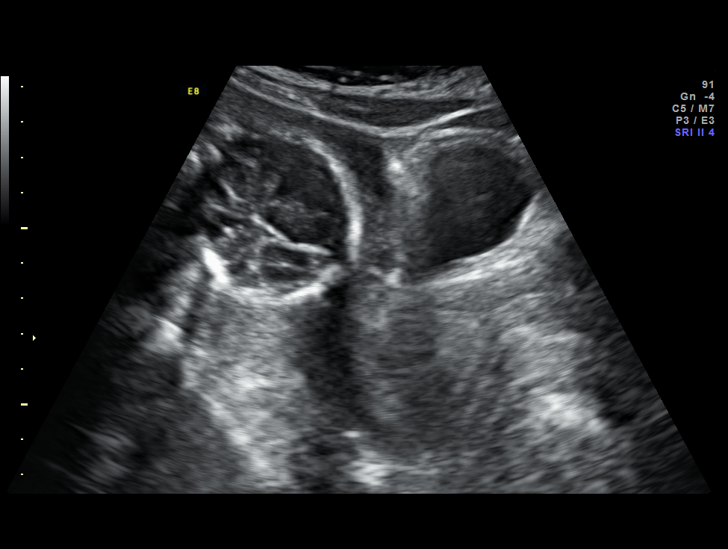
[im 4/32]
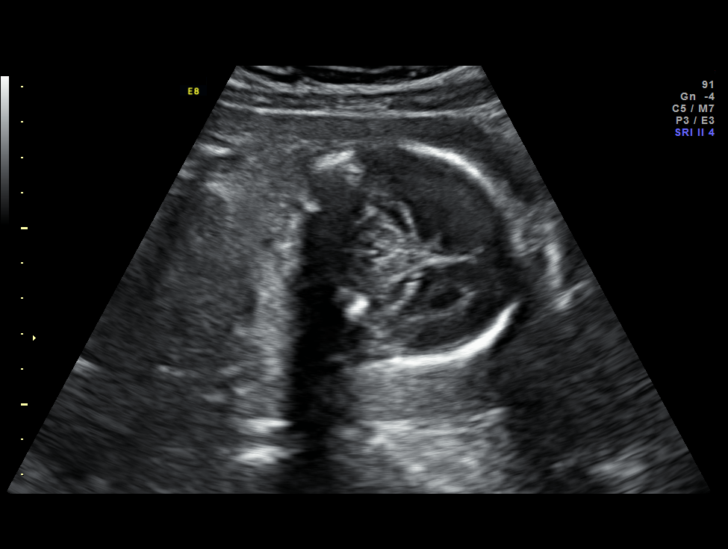
[im 6/32]
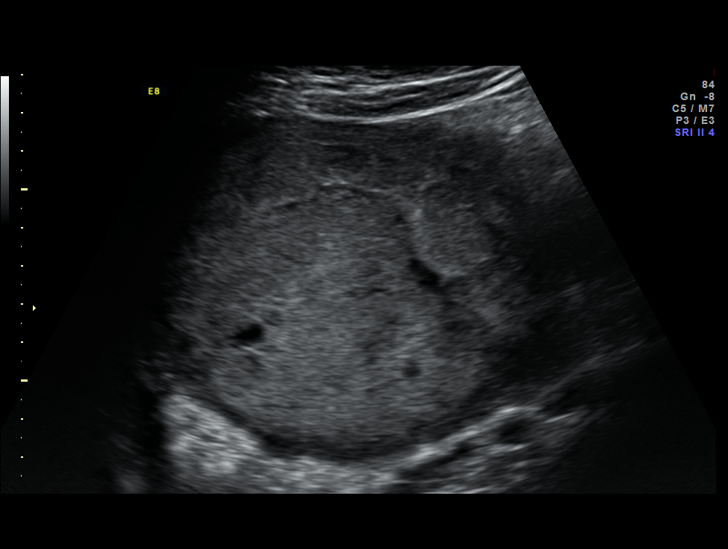
[im 9/32]
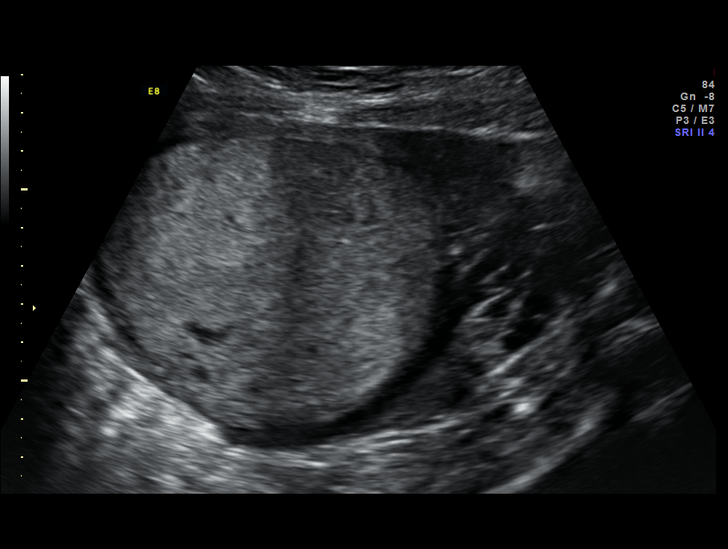
[im 11/32]
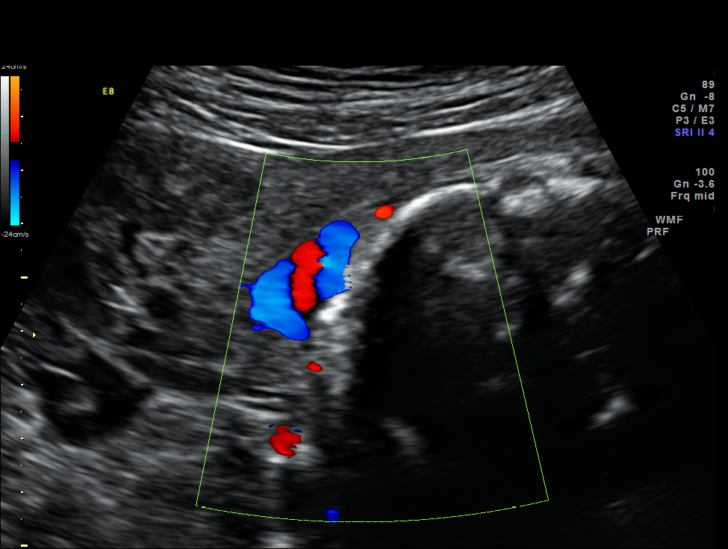
[im 13/32]
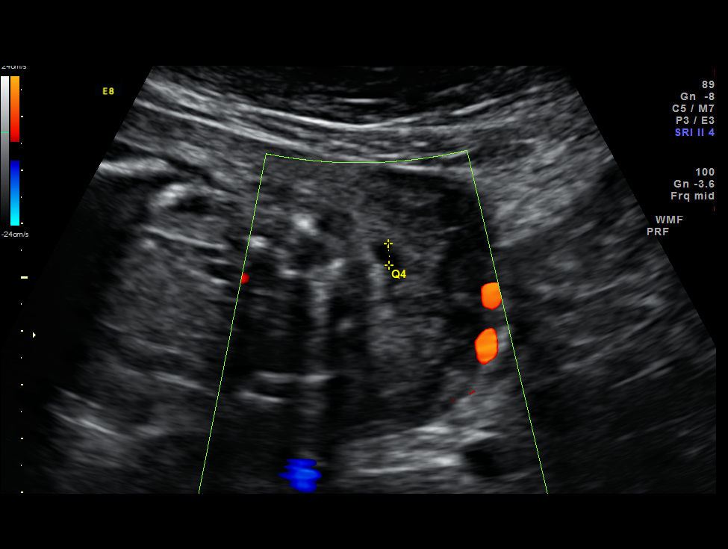
[im 15/32]
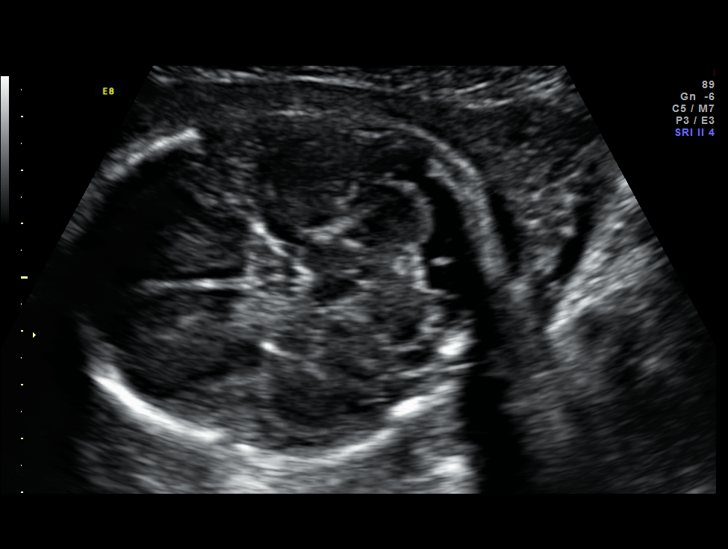
[im 18/32]
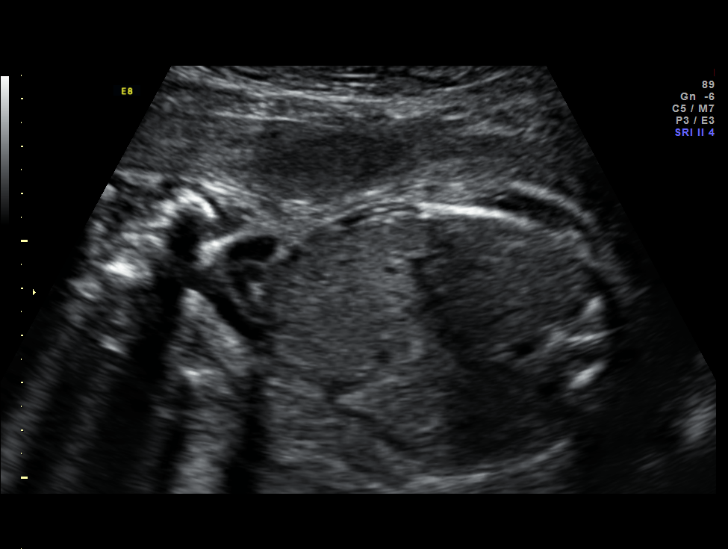
[im 20/32]
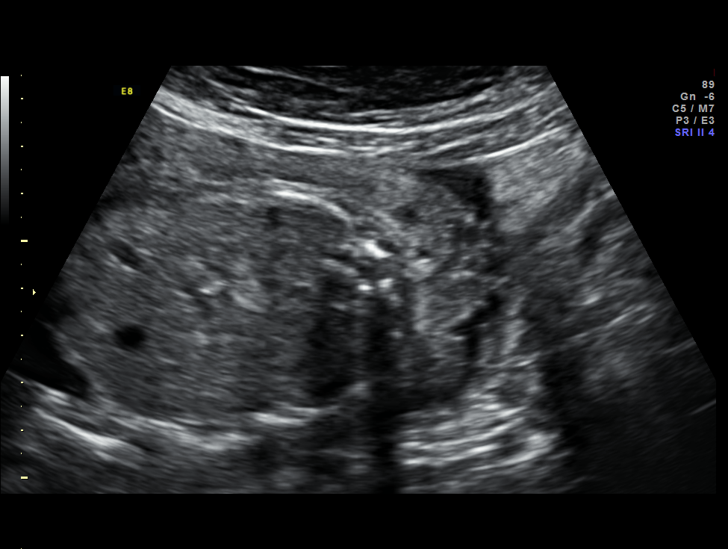
[im 22/32]
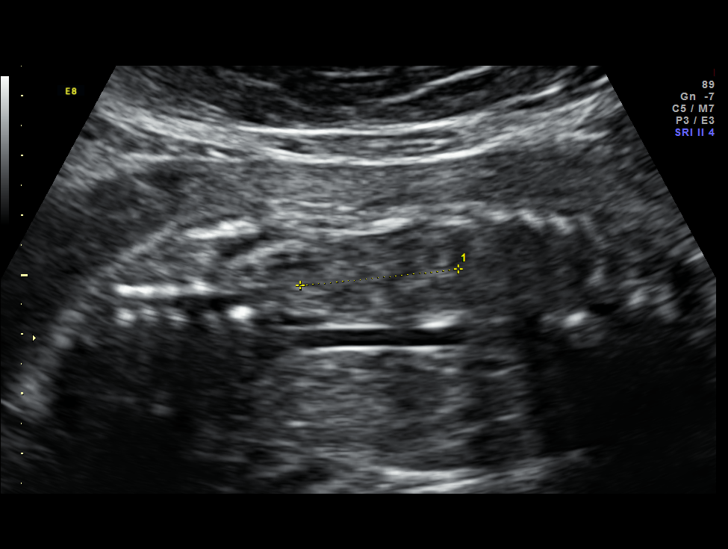
[im 25/32]
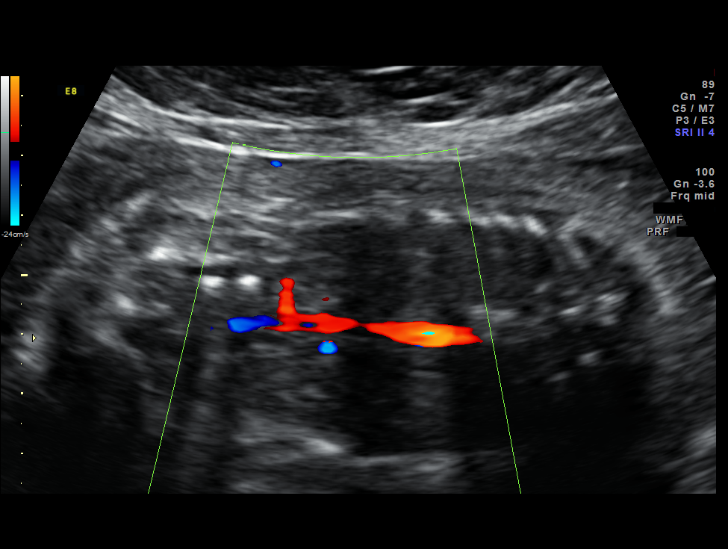
[im 27/32]
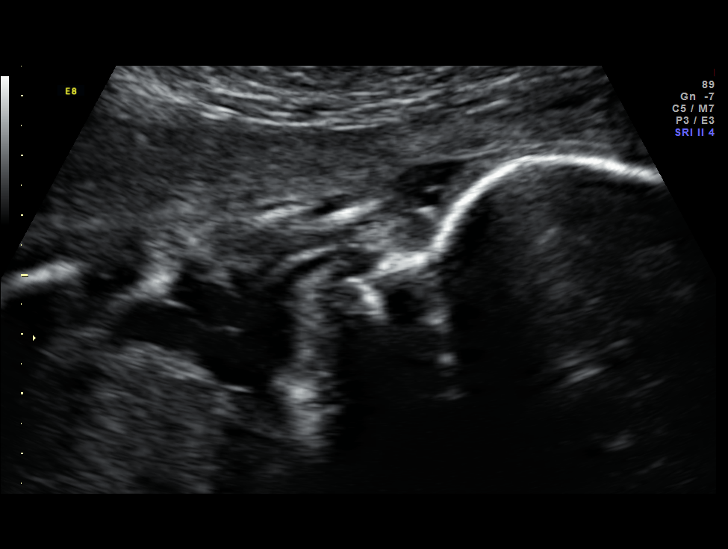
[im 29/32]
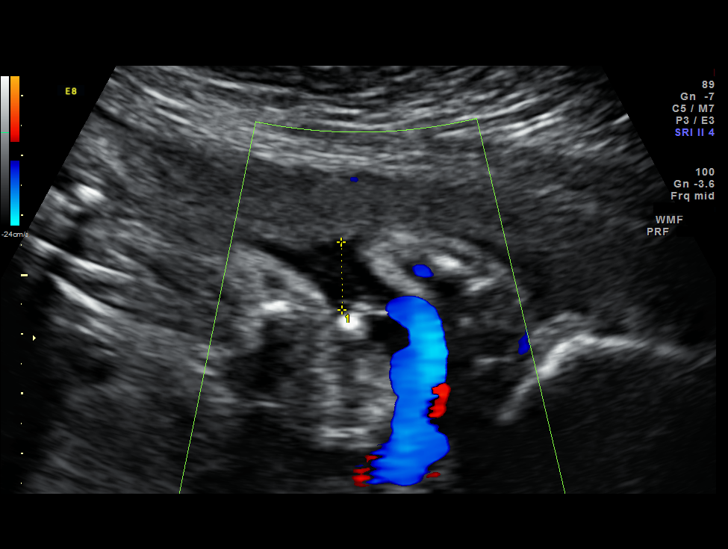
[im 32/32]
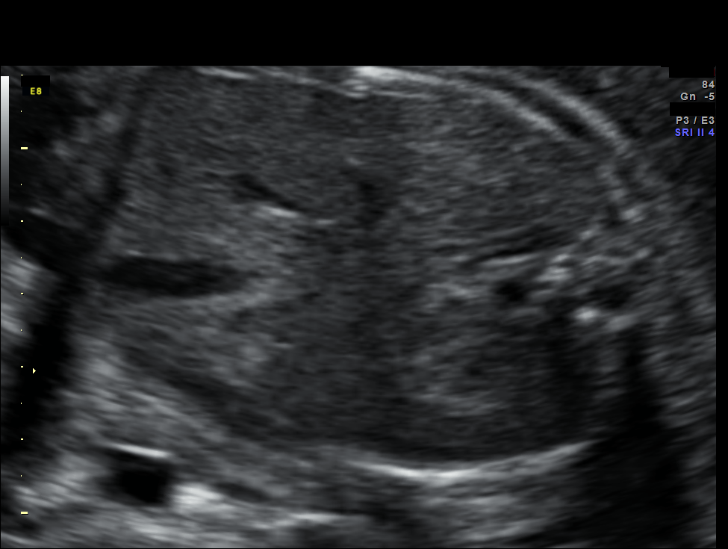

[14 of 28 positions shown; findings below may reference images not displayed]

Canned report from images found in remote index.

Refer to host system for actual result text.

## 2012-10-22 IMAGING — US US FETAL BPP W/O NONSTRESS
1 series · 14 of 24 positions shown · non-contrast
Comparison: none

[Series 1: us fetal bpp w/o nonstress · 24 acquisitions, 14 frames shown]
[im 1/24]
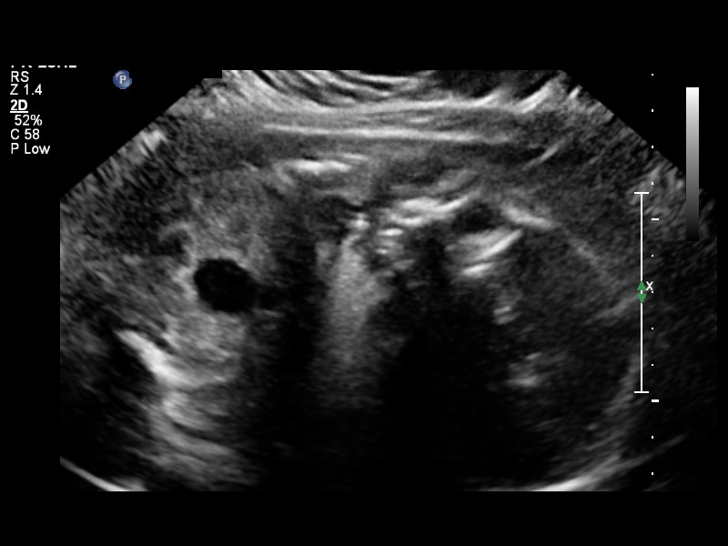
[im 3/24]
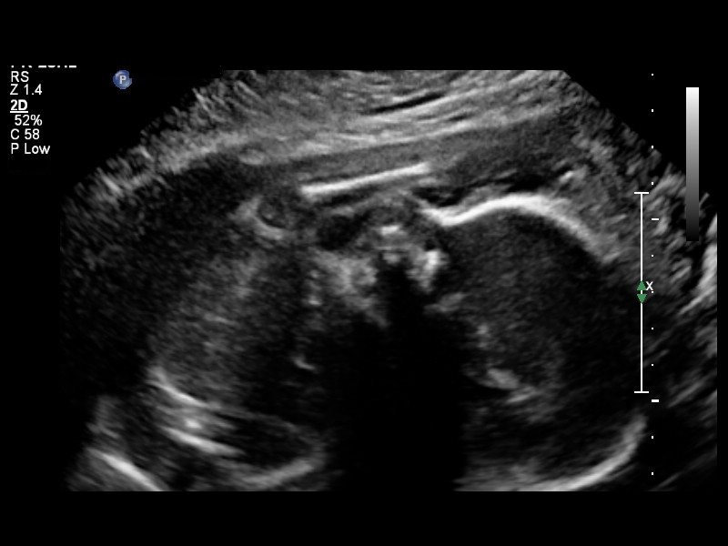
[im 5/24]
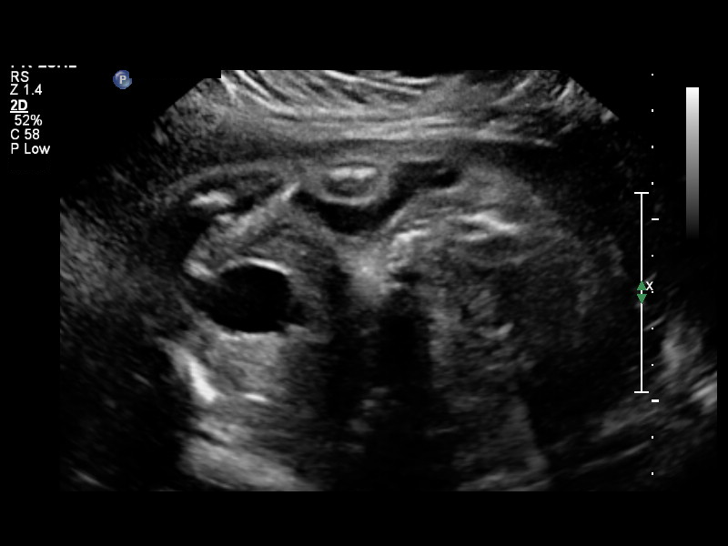
[im 7/24]
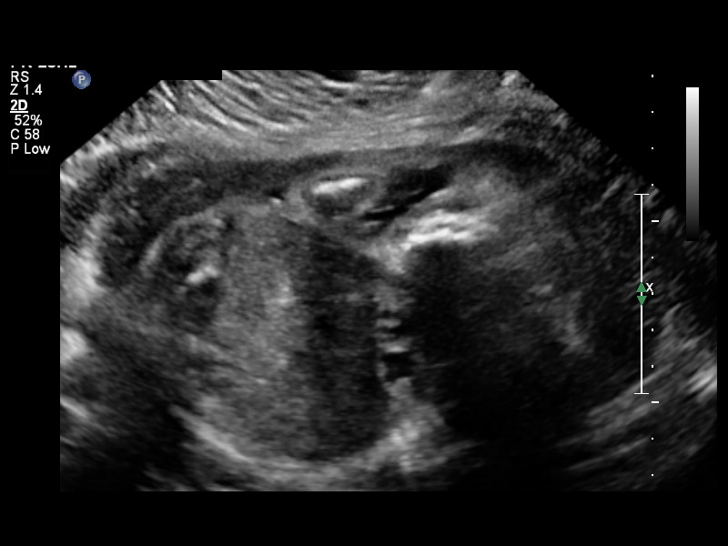
[im 8/24]
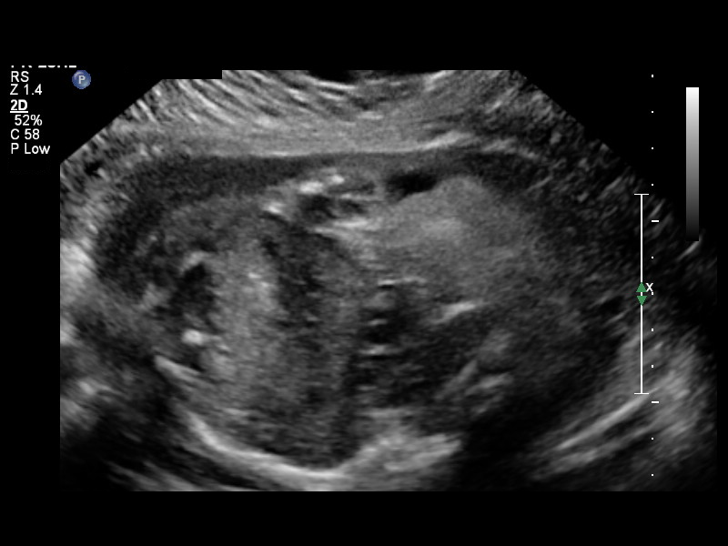
[im 10/24]
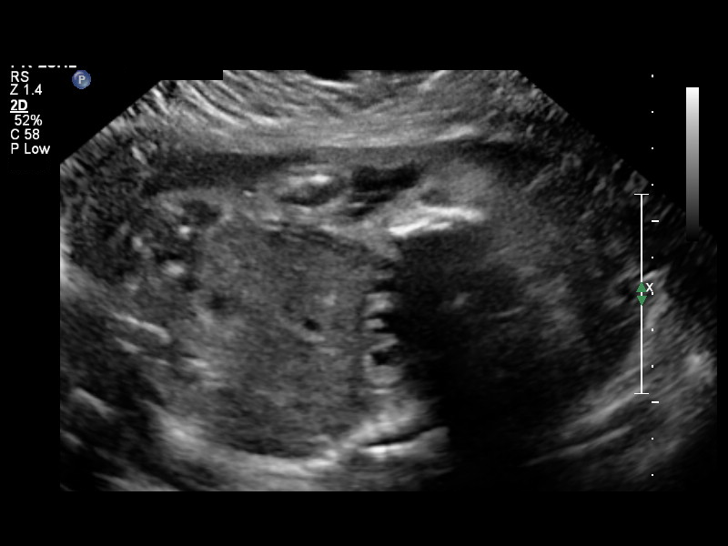
[im 12/24]
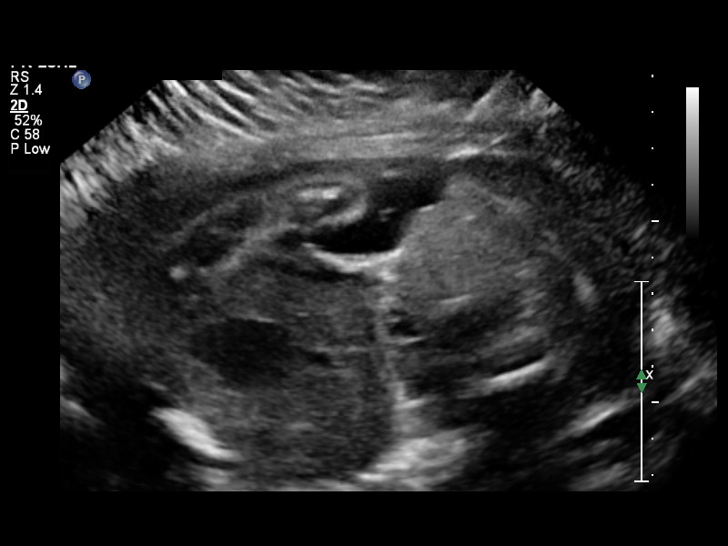
[im 13/24]
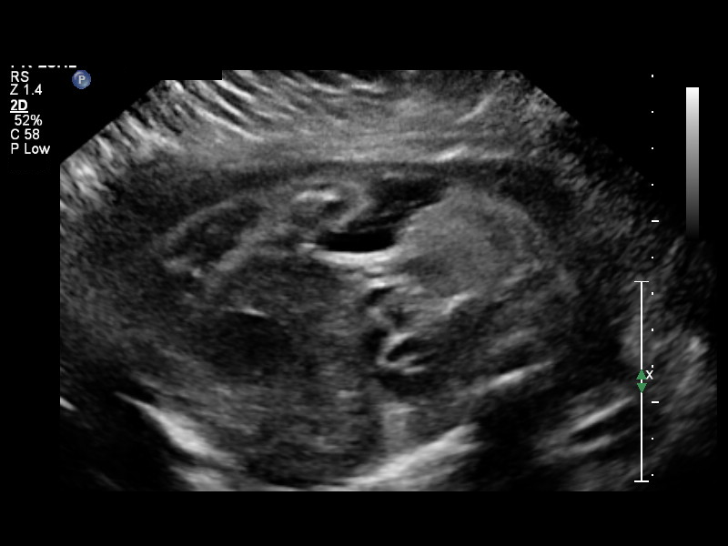
[im 15/24]
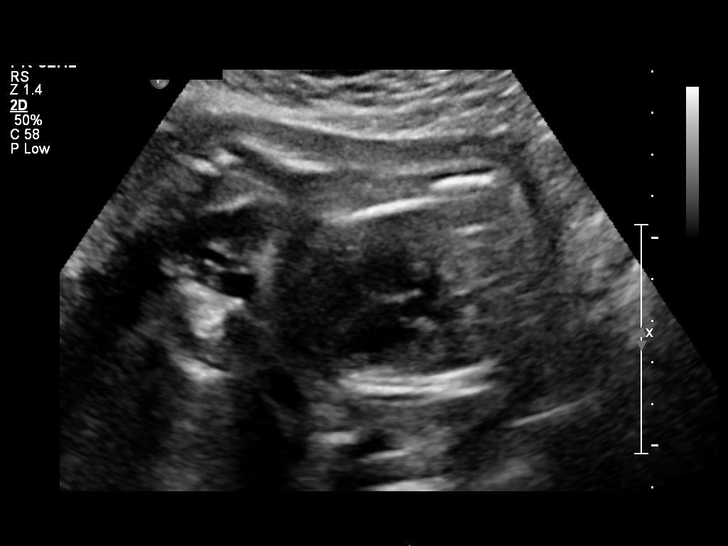
[im 17/24]
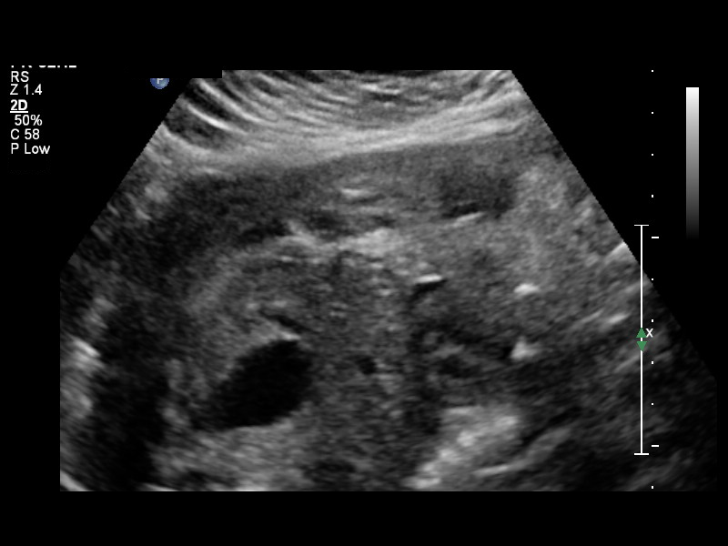
[im 19/24]
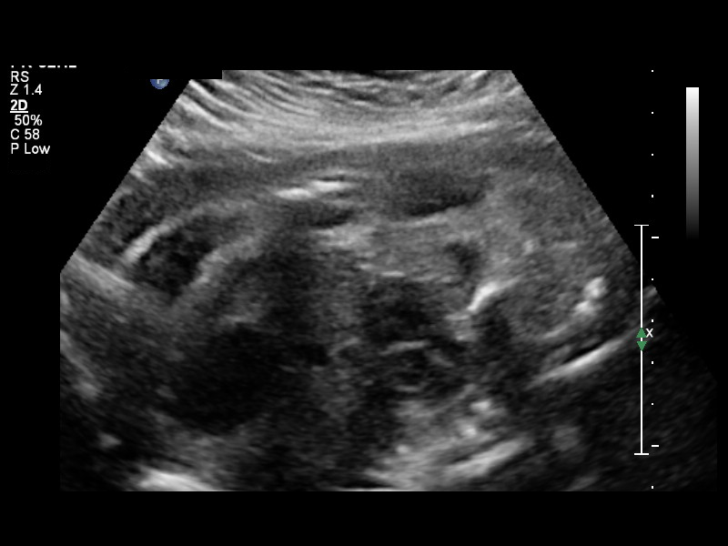
[im 20/24]
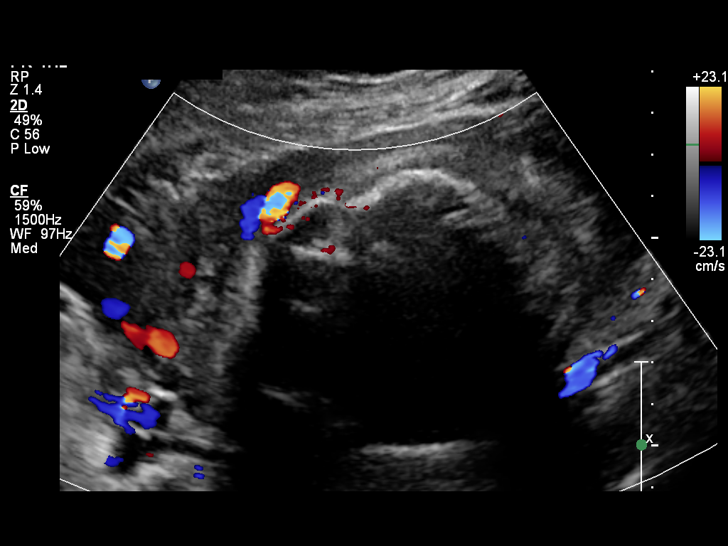
[im 22/24]
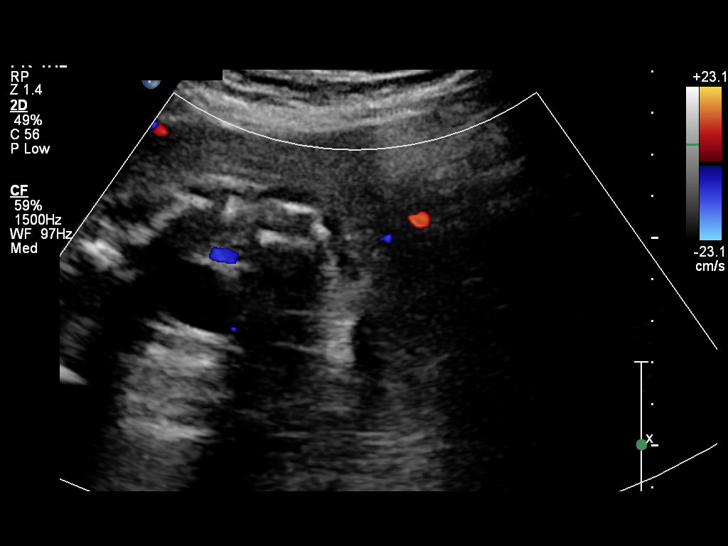
[im 24/24]
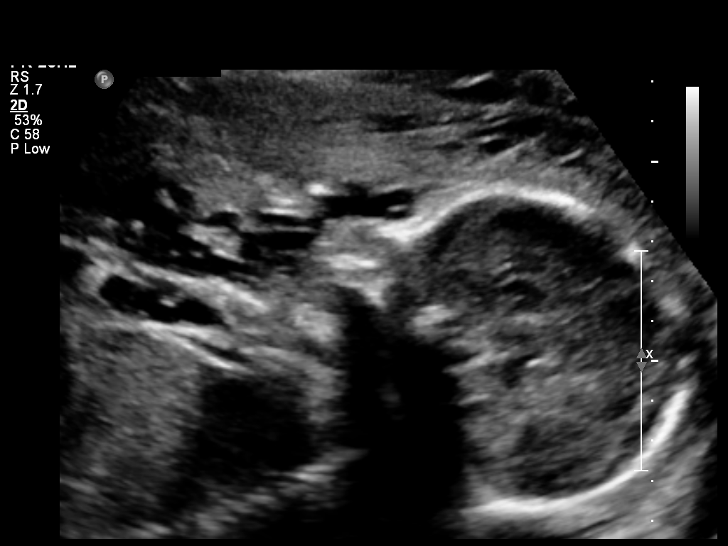

[14 of 24 positions shown; findings below may reference images not displayed]

Canned report from images found in remote index.

Refer to host system for actual result text.

## 2013-09-19 ENCOUNTER — Encounter (HOSPITAL_COMMUNITY): Payer: Self-pay | Admitting: Emergency Medicine

## 2013-09-19 ENCOUNTER — Emergency Department (HOSPITAL_COMMUNITY): Payer: Self-pay

## 2013-09-19 ENCOUNTER — Emergency Department (HOSPITAL_COMMUNITY)
Admission: EM | Admit: 2013-09-19 | Discharge: 2013-09-19 | Disposition: A | Discharge status: Home / Self Care | Payer: Self-pay | Attending: Emergency Medicine | Admitting: Emergency Medicine

## 2013-09-19 DIAGNOSIS — N12 Tubulo-interstitial nephritis, not specified as acute or chronic: Secondary | ICD-10-CM | POA: Insufficient documentation

## 2013-09-19 DIAGNOSIS — Z8742 Personal history of other diseases of the female genital tract: Secondary | ICD-10-CM | POA: Insufficient documentation

## 2013-09-19 DIAGNOSIS — R42 Dizziness and giddiness: Secondary | ICD-10-CM | POA: Insufficient documentation

## 2013-09-19 DIAGNOSIS — Z3202 Encounter for pregnancy test, result negative: Secondary | ICD-10-CM | POA: Insufficient documentation

## 2013-09-19 DIAGNOSIS — Z792 Long term (current) use of antibiotics: Secondary | ICD-10-CM | POA: Insufficient documentation

## 2013-09-19 DIAGNOSIS — Z87440 Personal history of urinary (tract) infections: Secondary | ICD-10-CM | POA: Insufficient documentation

## 2013-09-19 HISTORY — DX: Endometriosis, unspecified: N80.9

## 2013-09-19 HISTORY — DX: Unspecified ovarian cyst, unspecified side: N83.209

## 2013-09-19 LAB — URINALYSIS, ROUTINE W REFLEX MICROSCOPIC
Glucose, UA: NEGATIVE mg/dL
Hgb urine dipstick: NEGATIVE
Ketones, ur: 40 mg/dL — AB
Nitrite: POSITIVE — AB
Protein, ur: 100 mg/dL — AB
Specific Gravity, Urine: 1.027 (ref 1.005–1.030)
Urobilinogen, UA: 4 mg/dL — ABNORMAL HIGH (ref 0.0–1.0)
pH: 5 (ref 5.0–8.0)

## 2013-09-19 LAB — CBC WITH DIFFERENTIAL/PLATELET
Basophils Absolute: 0 10*3/uL (ref 0.0–0.1)
Eosinophils Absolute: 0 10*3/uL (ref 0.0–0.7)
Eosinophils Relative: 0 % (ref 0–5)
Lymphs Abs: 1.6 10*3/uL (ref 0.7–4.0)
MCH: 30 pg (ref 26.0–34.0)
MCV: 86.8 fL (ref 78.0–100.0)
Platelets: 220 10*3/uL (ref 150–400)
RDW: 12 % (ref 11.5–15.5)

## 2013-09-19 LAB — URINE MICROSCOPIC-ADD ON

## 2013-09-19 LAB — POCT I-STAT, CHEM 8
Calcium, Ion: 1.3 mmol/L — ABNORMAL HIGH (ref 1.12–1.23)
Creatinine, Ser: 0.8 mg/dL (ref 0.50–1.10)
Hemoglobin: 15 g/dL (ref 12.0–15.0)
Sodium: 142 mEq/L (ref 135–145)
TCO2: 25 mmol/L (ref 0–100)

## 2013-09-19 MED ORDER — SODIUM CHLORIDE 0.9 % IV BOLUS (SEPSIS)
1000.0000 mL | INTRAVENOUS | Status: AC
  Administered 2013-09-19: 1000 mL via INTRAVENOUS

## 2013-09-19 MED ORDER — CIPROFLOXACIN HCL 500 MG PO TABS
500.0000 mg | ORAL_TABLET | Freq: Once | ORAL | Status: AC
Start: 2013-09-19 — End: 2013-09-19
  Administered 2013-09-19: 500 mg via ORAL
  Filled 2013-09-19: qty 1

## 2013-09-19 MED ORDER — CIPROFLOXACIN HCL 500 MG PO TABS: 500.0000 mg | ORAL_TABLET | Freq: Two times a day (BID) | ORAL | Status: DC

## 2013-09-19 NOTE — ED Notes (Signed)
Per EMS pt c/o rt flank pain radiating to lower abd with n/v x3days ago, hx of urinary stents, gave zofran 4mg , toradol 30mg  with no relief and fentanyl with pain relief. 20g to lt [facility]

## 2013-09-19 NOTE — ED Provider Notes (Signed)
Medical screening examination/treatment/procedure(s) were performed by non-physician practitioner and as supervising physician I was immediately available for consultation/collaboration.  EKG Interpretation   None         Glynn Octave, MD 09/19/13 1640

## 2013-09-19 NOTE — ED Notes (Signed)
Bed: WA17 Expected date:  Expected time:  Means of arrival:  Comments: Flank pain 

## 2013-09-19 NOTE — ED Provider Notes (Signed)
CSN: [id number]     Arrival date & time 09/19/13  1002 History   First MD Initiated Contact with Patient 09/19/13 1015     Chief Complaint  Patient presents with  . Flank Pain   (Consider location/radiation/quality/duration/timing/severity/associated sxs/prior Treatment) HPI Pt is a 27yo female with hx of recurrent UTIs, endometriosis, ovarian cyst and stent placement for UTIs when she was 27yrs old. Today, pt was BIB EMS for near syncopal episode after taking OTC Azo.  Reports 5-35min after taking the medication for her dysuria, frequency and urgency x1 week, she began to feels flush in her face, racing heart and light headed. Significant other called EMS as pt has had similar reaction to same medication in the past but "never this bad."  Upon arrival, pt states she is feeling better.  Pt does not currently have urologist, however PCP placed her on daily antibiotics several months ago. Pt states she has not recently taken any antibiotics. Denies fever, n/v/d. Denies abdominal pain or vaginal symptoms.   Past Medical History  Diagnosis Date  . Endometriosis   . Ovarian cyst    Past Surgical History  Procedure Laterality Date  . Cesarean section     No family history on file. History  Substance Use Topics  . Smoking status: Never Smoker   . Smokeless tobacco: Not on file  . Alcohol Use: Yes     Comment: social   OB History   Grav Para Term Preterm Abortions TAB SAB Ect Mult Living                 Review of Systems  Constitutional: Negative for fever and chills.  Gastrointestinal: Negative for nausea, vomiting, abdominal pain and diarrhea.  Genitourinary: Positive for dysuria, urgency, frequency and flank pain ( right). Negative for hematuria, decreased urine volume, vaginal bleeding, vaginal discharge, genital sores, vaginal pain and pelvic pain.  Neurological: Positive for weakness and light-headedness. Negative for headaches.  All other systems reviewed and are  negative.    Allergies  Azo  Home Medications   Current Outpatient Rx  Name  Route  Sig  Dispense  Refill  . Cranberry-Vitamin C-Probiotic (AZO CRANBERRY PO)   Oral   Take 2 tablets by mouth daily.         . ciprofloxacin (CIPRO) 500 MG tablet   Oral   Take 1 tablet (500 mg total) by mouth every 12 (twelve) hours.   20 tablet   0    BP 100/54  Pulse 78  Temp(Src) 98.7 F (37.1 C) (Oral)  Resp 20  SpO2 99%  LMP 09/08/2013 Physical Exam  Nursing note and vitals reviewed. Constitutional: She is oriented to person, place, and time. She appears well-developed and well-nourished. No distress.  Pt lying comfortably in exam bed, NAD.   HENT:  Head: Normocephalic and atraumatic.  Eyes: Conjunctivae are normal. No scleral icterus.  Neck: Normal range of motion.  Cardiovascular: Normal rate, regular rhythm and normal heart sounds.   Pulmonary/Chest: Effort normal and breath sounds normal. No respiratory distress. She has no wheezes. She has no rales. She exhibits no tenderness.  Abdominal: Soft. Bowel sounds are normal. She exhibits no distension and no mass. There is no tenderness. There is no rebound and no guarding.  Musculoskeletal: Normal range of motion.  Neurological: She is alert and oriented to person, place, and time. GCS eye subscore is 4. GCS verbal subscore is 5. GCS motor subscore is 6.  Skin: Skin is warm and dry. She  is not diaphoretic.    ED Course  Procedures (including critical care time) Labs Review Labs Reviewed  URINALYSIS, ROUTINE W REFLEX MICROSCOPIC - Abnormal; Notable for the following:    Color, Urine ORANGE (*)    APPearance TURBID (*)    Bilirubin Urine MODERATE (*)    Ketones, ur 40 (*)    Protein, ur 100 (*)    Urobilinogen, UA 4.0 (*)    Nitrite POSITIVE (*)    Leukocytes, UA LARGE (*)    All other components within normal limits  CBC WITH DIFFERENTIAL - Abnormal; Notable for the following:    WBC 18.5 (*)    Neutrophils Relative %  86 (*)    Neutro Abs 15.9 (*)    Lymphocytes Relative 9 (*)    All other components within normal limits  URINE MICROSCOPIC-ADD ON - Abnormal; Notable for the following:    Squamous Epithelial / LPF FEW (*)    Bacteria, UA FEW (*)    Crystals CA OXALATE CRYSTALS (*)    All other components within normal limits  POCT I-STAT, CHEM 8 - Abnormal; Notable for the following:    Glucose, Bld 131 (*)    Calcium, Ion 1.30 (*)    All other components within normal limits  URINE CULTURE  POCT PREGNANCY, URINE   Imaging Review Ct Abdomen Pelvis Wo Contrast  09/19/2013   CLINICAL DATA:  Left-sided flank pain.  EXAM: CT ABDOMEN AND PELVIS WITHOUT CONTRAST  TECHNIQUE: Multidetector CT imaging of the abdomen and pelvis was performed following the standard protocol without IV contrast.  COMPARISON:  None.  FINDINGS: There is no evidence of hydronephrosis or renal calculi. The ureters and bladder are unremarkable. Unenhanced appearance of the liver, gallbladder, pancreas, spleen, adrenal glands and bowel are unremarkable. No abnormal fluid collections or masses are identified. The uterus and adnexal regions are normal in appearance by CT. No hernias are identified. Bony structures are within normal limits.  IMPRESSION: Normal CT of the abdomen and pelvis without contrast.   Electronically Signed   By: Irish Lack M.D.   On: 09/19/2013 12:31    EKG Interpretation   None       MDM   1. Pyelonephritis    Pt with hx of recurrent UTIs reporting reaction to OTC Azo for which she took due to increase urinary frequency, urgency and dysuria x1 week.   She became lightheaded, flushed, and had palpitations. Reports feeling well upon arrival to ED.  Declined pain medication.  Will start fluids, check basic labs and reevaluate.   Urine preg: negative UA: consistent with pyelonephritis Urine culture sent. CBC: WBC-18.5 i-stat chem 8: unremarkable.  CT abd: normal CT abd.  No mention of ureteral  stent.  All labs/imaging/findings discussed with patient. All questions answered and concerns addressed. Will discharge pt home and have pt f/u with [facility] and urology. Rx: Cirpo x10 days. Return precautions given. Pt verbalized understanding and agreement with tx plan. Vitals: unremarkable. Discharged in stable condition.    Discussed pt with Dr. Manus Gunning during ED encounter and agrees with plan.    Junius Finner, PA-C 09/19/13 [phone]

## 2013-09-21 LAB — URINE CULTURE: Colony Count: 55000

## 2013-09-22 ENCOUNTER — Telehealth (HOSPITAL_COMMUNITY): Payer: Self-pay | Admitting: Emergency Medicine

## 2013-09-22 NOTE — ED Notes (Signed)
Post ED Visit - Positive Culture Follow-up  Culture report reviewed by antimicrobial stewardship pharmacist: [] Wes Dulaney, Pharm.D., BCPS [] Jeremy Frens, Pharm.D., BCPS [] Elizabeth Martin, Pharm.D., BCPS [] Minh Pham, Pharm.D., BCPS, AAHIVP [x] Michelle Turner, Pharm.D., BCPS, AAHIVP  Positive urine culture Treated with Cipro, organism sensitive to the same and no further patient follow-up is required at this time.  Eladio Dentremont 09/22/2013, 1:09 PM   

## 2013-12-05 ENCOUNTER — Other Ambulatory Visit (HOSPITAL_COMMUNITY)
Admission: RE | Admit: 2013-12-05 | Discharge: 2013-12-05 | Disposition: A | Discharge status: Home / Self Care | Payer: Managed Care, Other (non HMO) | Source: Ambulatory Visit | Attending: Family Medicine | Admitting: Family Medicine

## 2013-12-05 ENCOUNTER — Other Ambulatory Visit: Payer: Self-pay

## 2013-12-05 DIAGNOSIS — Z01419 Encounter for gynecological examination (general) (routine) without abnormal findings: Secondary | ICD-10-CM | POA: Insufficient documentation

## 2013-12-06 ENCOUNTER — Other Ambulatory Visit: Payer: Self-pay | Admitting: Family

## 2013-12-06 DIAGNOSIS — R1032 Left lower quadrant pain: Secondary | ICD-10-CM

## 2013-12-07 ENCOUNTER — Ambulatory Visit
Admission: RE | Admit: 2013-12-07 | Discharge: 2013-12-07 | Disposition: A | Discharge status: Home / Self Care | Payer: BC Managed Care – PPO | Source: Ambulatory Visit | Attending: Family | Admitting: Family

## 2013-12-07 DIAGNOSIS — R1032 Left lower quadrant pain: Secondary | ICD-10-CM

## 2013-12-12 ENCOUNTER — Other Ambulatory Visit: Payer: Self-pay | Admitting: Family Medicine

## 2013-12-12 DIAGNOSIS — R9389 Abnormal findings on diagnostic imaging of other specified body structures: Secondary | ICD-10-CM

## 2013-12-15 ENCOUNTER — Other Ambulatory Visit: Payer: Managed Care, Other (non HMO)

## 2013-12-23 ENCOUNTER — Ambulatory Visit
Admission: RE | Admit: 2013-12-23 | Discharge: 2013-12-23 | Disposition: A | Discharge status: Home / Self Care | Payer: BC Managed Care – PPO | Source: Ambulatory Visit | Attending: Family Medicine | Admitting: Family Medicine

## 2013-12-23 DIAGNOSIS — R9389 Abnormal findings on diagnostic imaging of other specified body structures: Secondary | ICD-10-CM

## 2013-12-23 MED ORDER — GADOBENATE DIMEGLUMINE 529 MG/ML IV SOLN
15.0000 mL | Freq: Once | INTRAVENOUS | Status: AC | PRN
  Administered 2013-12-23: 15 mL via INTRAVENOUS

## 2014-02-04 ENCOUNTER — Encounter (INDEPENDENT_AMBULATORY_CARE_PROVIDER_SITE_OTHER): Payer: Self-pay | Admitting: Surgery

## 2014-02-04 ENCOUNTER — Ambulatory Visit (INDEPENDENT_AMBULATORY_CARE_PROVIDER_SITE_OTHER): Payer: BC Managed Care – PPO | Admitting: Surgery

## 2014-02-04 VITALS — BP 110/80 | HR 80 | Temp 97.2°F | Resp 14 | Ht 64.0 in | Wt 164.0 lb

## 2014-02-04 DIAGNOSIS — N806 Endometriosis in cutaneous scar: Secondary | ICD-10-CM | POA: Insufficient documentation

## 2014-02-04 DIAGNOSIS — K5900 Constipation, unspecified: Secondary | ICD-10-CM

## 2014-02-04 DIAGNOSIS — K5909 Other constipation: Secondary | ICD-10-CM

## 2014-02-04 DIAGNOSIS — R1904 Left lower quadrant abdominal swelling, mass and lump: Secondary | ICD-10-CM

## 2014-02-04 NOTE — Progress Notes (Signed)
Subjective:     Patient ID: Mary Cannon, female   DOB: 03/31/1986, 28 y.o.   MRN: [id number]  HPI  Note: This dictation was prepared with Dragon/digital dictation along with Smartphrase technology. Any transcriptional errors that result from this process are unintentional.       Mary Cannon  07/02/1986 [id number]  Patient Care Team: Cammie Fulp, MD as PCP - General (Family Medicine) David C Lowe, MD as Consulting Physician (Obstetrics and Gynecology)  This patient is a 28 y.o.female who presents today for surgical evaluation at the request of Dr. Lowe.   Reason for visit: Mass in abdominal wall.  Suspicious for endometrioma  Pleasant female.  She comes today with her husband.  Started noting his pain in the left lower quadrant were abdomen with exercise year ago.  Seemed to be related to that only.  There became more constant.  She feels she could feel a lump.  She noted the pain would get worse with her menstrual cycles and persistent 2 weeks afterwards.  She does have chronic constipation but that has not changed.  She is rather active as a parent.  No history of infections or problems with her C-section surgery in 2012.  No other surgeries.  Ultrasound and MRI concerning for mass at the level of the fascia.  Endometrioma suspected given change with her menstrual cycle & location.  The patient recalls some question of pelvic endometriosis that improved since  her pregnancy.  Occasionally takes ibuprofen for menstrual cycle but not severe.  Cannot tolerate any hormonal therapy as it causes significant mood swings.  They tried a NuvaRing which is low hormone dose and she could not tolerate it.  Patient Active Problem List   Diagnosis Date Noted  . Abdominal wall mass of left lower quadrant, probable endometrioma 02/04/2014  . Constipation, chronic 02/04/2014    Past Medical History  Diagnosis Date  . Endometriosis   . Ovarian cyst     Past Surgical History  Procedure  Laterality Date  . Cesarean section      History   Social History  . Marital Status: Married    Spouse Name: N/A    Number of Children: N/A  . Years of Education: N/A   Occupational History  . Not on file.   Social History Main Topics  . Smoking status: Never Smoker   . Smokeless tobacco: Not on file  . Alcohol Use: Yes     Comment: social  . Drug Use: No  . Sexual Activity: Not on file   Other Topics Concern  . Not on file   Social History Narrative  . No narrative on file    History reviewed. No pertinent family history.  Current Outpatient Prescriptions  Medication Sig Dispense Refill  . HYDROcodone-acetaminophen (NORCO/VICODIN) 5-325 MG per tablet Take 1 tablet by mouth every 6 (six) hours as needed for moderate pain or severe pain.       No current facility-administered medications for this visit.     Allergies  Allergen Reactions  . Azo [Phenazopyridine] Shortness Of Breath and Nausea And Vomiting    Elevated heart rate    BP 110/80  Pulse 80  Temp(Src) 97.2 F (36.2 C) (Temporal)  Resp 14  Ht 5' 4" (1.626 m)  Wt 164 lb (74.39 kg)  BMI 28.14 kg/m2  No results found.   Review of Systems  Constitutional: Negative for fever, chills, diaphoresis, appetite change and fatigue.  HENT: Negative for ear discharge, ear pain, sore throat   and trouble swallowing.   Eyes: Negative for photophobia, discharge and visual disturbance.  Respiratory: Negative for cough, choking, chest tightness and shortness of breath.   Cardiovascular: Negative for chest pain and palpitations.  Gastrointestinal: Positive for abdominal pain. Negative for nausea, vomiting, diarrhea, constipation, anal bleeding and rectal pain.  Endocrine: Negative for cold intolerance and heat intolerance.  Genitourinary: Negative for dysuria, frequency and difficulty urinating.  Musculoskeletal: Negative for gait problem, myalgias and neck pain.  Skin: Negative for color change, pallor and rash.    Allergic/Immunologic: Negative for environmental allergies, food allergies and immunocompromised state.  Neurological: Negative for dizziness, speech difficulty, weakness and numbness.  Hematological: Negative for adenopathy.  Psychiatric/Behavioral: Negative for confusion and agitation. The patient is not nervous/anxious.        Objective:   Physical Exam  Constitutional: She is oriented to person, place, and time. She appears well-developed and well-nourished. No distress.  HENT:  Head: Normocephalic.  Mouth/Throat: Oropharynx is clear and moist. No oropharyngeal exudate.  Eyes: Conjunctivae and EOM are normal. Pupils are equal, round, and reactive to light. No scleral icterus.  Neck: Normal range of motion. Neck supple. No tracheal deviation present.  Cardiovascular: Normal rate, regular rhythm and intact distal pulses.   Pulmonary/Chest: Effort normal and breath sounds normal. No stridor. No respiratory distress. She exhibits no tenderness.  Abdominal: Soft. She exhibits no distension and no mass. There is no tenderness. Hernia confirmed negative in the right inguinal area and confirmed negative in the left inguinal area.    Genitourinary: No vaginal discharge found.  Musculoskeletal: Normal range of motion. She exhibits no tenderness.       Right elbow: She exhibits normal range of motion.       Left elbow: She exhibits normal range of motion.       Right wrist: She exhibits normal range of motion.       Left wrist: She exhibits normal range of motion.       Right hand: Normal strength noted.       Left hand: Normal strength noted.  Lymphadenopathy:       Head (right side): No posterior auricular adenopathy present.       Head (left side): No posterior auricular adenopathy present.    She has no cervical adenopathy.    She has no axillary adenopathy.       Right: No inguinal adenopathy present.       Left: No inguinal adenopathy present.  Neurological: She is alert and  oriented to person, place, and time. No cranial nerve deficit. She exhibits normal muscle tone. Coordination normal.  Skin: Skin is warm and dry. No rash noted. She is not diaphoretic. No erythema.  Psychiatric: She has a normal mood and affect. Her behavior is normal. Judgment and thought content normal.   CLINICAL DATA: Anterior pelvic pain along C-section scar, with  hypoechoic subcutaneous nodule(s) in this vicinity, query  endometriomas.  EXAM:  MRI PELVIS WITHOUT AND WITH CONTRAST  TECHNIQUE:  Multiplanar multisequence MR imaging of the pelvis was performed  both before and after administration of intravenous contrast.  CONTRAST: 15mL MULTIHANCE GADOBENATE DIMEGLUMINE 529 MG/ML IV SOLN  COMPARISON: US TRANSVAGINAL NON-OB dated 12/07/2013  FINDINGS:  1.2 x 0.8 cm focus of low grade nodular enhancement along the  anterior margin of the left rectus abdominus muscle in the vicinity  of the C-section scar. Punctate regions of gradient bloom along the  C-section scar are also present, probably representing artifact from    suture material or chronic hemosiderin/blood products.  Trace pelvic ascites. The cesarean section scar noted in the uterus.  Endometrium 9 mm in thickness with transitional zone normal. Uterine  length 9.7 cm. Right ovary 2.7 x 3.7 x 3.1 cm with suspected  posterior cyst and anterior corpus luteum. Left ovary 2.9 x 1.6 by  2.0 cm, normal in appearance.  Urinary bladder unremarkable. Trace free fluid in the right  pericolic gutter. Gradient below along the lower uterine segment  compatible with suture material. On large field of view images a 1.3  cm T2 hyperintensity in the L3 vertebral body on the left (image 11,  series 2) is probably a hemangioma but technically nonspecific.  Given the patient's age I do not think this merits further workup.  IMPRESSION:  1. MRI confirms a single small enhancing nodule along the c-section  scar with epicenter 1.7 cm lateral to the  linea alba. This structure  is faintly enhancing, and although not specific for endometrioma it  could well represent an endometrioma, particularly if the patient's  pain at this site is cyclic.  2. Numerous foci of bloom artifact along the C-section scar and  along the anterior portion of lower uterine segment, compatible with  suture material and/or chronic hemosiderin.  3. Trace amount of free pelvic fluid. Suspected anterior corpus  luteum in the right ovary, and 2.2 cm right posterior ovarian cyst.  Electronically Signed  By: Walt Liebkemann M.D.  On: 12/23/2013 15:17             External Result Report    External Result Report            Imaging       Assessment:     Abdominal wall mass ~2cm at level of fascia/muscle left lower quadrant just above the Pfannenstiel incision.  Suspicious for endometrioma     Plan:     Discuss with the patient and her husband.  I called and discussed with her gynecologist Dr. Lowe.  I suspect this will require removal.  She has very poor tolerance to hormonal therapy and did not tolerate even the NuvaRing which is very low dose.  Dr. Lowe does not feel that the patient requires laparoscopic exploration for a pelvic endometriosis.  Patient is not strongly interested in that.    Usually this requires an abdominal exploration of the fascia with excision.  Probably will need a patch of mesh to cover the defect as it is hard to primarily close.  We will see.  Hopefully no need for a drain postoperatively but sometimes that is needed as well temporarily.  She does wish to proceed with surgery.  I discussed this with the patient and her husband:   The pathophysiology of abdominal wall masses was discussed.  Natural history risks without surgery were discussed.  I recommended surgery to remove the mass.  I explained the technique of removal with use of anesthesia for patient comfort.    Risks such as bleeding, infection, heart attack, death,  and other risks were discussed.  I noted a good likelihood this will help address the problem.   Possibility that this will not correct all symptoms was explained. Possibility of regrowth/recurrence of the mass was discussed.  We will work to minimize complications. Questions were answered.  The patient expresses understanding & wishes to proceed with surgery.         

## 2014-02-04 NOTE — Patient Instructions (Signed)
Please consider the recommendations that we have given you today:  Consider surgery to explore abdominal wall and move remove the painful mass in abdominal wall causing your pain.  (probable endometrioma)  No need for laparoscopic exploration of pelvis to rule out/in endometriosis there according to gynecologist Dr. Corinna Capra  See the Handout(s) we have given you.  Please call our office at [phone] if you wish to schedule surgery or if you have further questions / concerns.   Endometriosis Endometriosis is a condition in which the tissue that lines the uterus (endometrium) grows outside of its normal location. The tissue may grow in many locations close to the uterus, but it commonly grows on the ovaries, fallopian tubes, vagina, or bowel. Because the uterus expels, or sheds, its lining every menstrual cycle, there is bleeding wherever the endometrial tissue is located. This can cause pain because blood is irritating to tissues not normally exposed to it.  CAUSES  The cause of endometriosis is not known.  SIGNS AND SYMPTOMS  Often, there are no symptoms. When symptoms are present, they can vary with the location of the displaced tissue. Various symptoms can occur at different times. Although symptoms occur mainly during a woman's menstrual period, they can also occur midcycle and usually stop with menopause. Some people may go months with no symptoms at all. Symptoms may include:   Back or abdominal pain.   Heavier bleeding during periods.   Pain during intercourse.   Painful bowel movements.   Infertility. DIAGNOSIS  Your health care provider will do a physical exam and ask about your symptoms. Various tests may be done, such as:   Blood tests and urine tests. These are done to help rule out other problems.   Ultrasound. This test is done to look for abnormal tissue.   An X-ray of the lower bowel (barium enema).  Laparoscopy. In this procedure, a thin, lighted tube with a  tiny camera on the end (laparoscope) is inserted into your abdomen. This helps your health care provider look for abnormal tissue to confirm the diagnosis. The health care provider may also remove a small piece of tissue (biopsy) from any abnormal tissue found. This tissue sample can then be sent to a lab so it can be looked at under a microscope. TREATMENT  Treatment will vary and may include:   Medicines to relieve pain. Nonsteroidal anti-inflammatory drugs (NSAIDs) are a type of pain medicine that can help to relieve the pain caused by endometriosis.  Hormonal therapy. When using hormonal therapy, periods are eliminated. This eliminates the monthly exposure to blood by the displaced endometrial tissue.   Surgery. Surgery may sometimes be done to remove the abnormal endometrial tissue. In severe cases, surgery may be done to remove the fallopian tubes, uterus, and ovaries (hysterectomy). HOME CARE INSTRUCTIONS   Only take over-the-counter or prescription medicines for pain, discomfort, or fever as directed by your health care provider. Do not take aspirin because it may increase bleeding when you are not on hormonal therapy.   Avoid activities that produce pain, including sexual activity. SEEK MEDICAL CARE IF:  You have pelvic pain before, after, or during your periods.  You have pelvic pain between periods that gets worse during your period.  You have pelvic pain during or after sex.  You have pelvic pain with bowel movements or urination, especially during your period.  You have problems getting pregnant. SEEK IMMEDIATE MEDICAL CARE IF:   Your pain is severe and is not responding  to pain medicine.   You have severe nausea and vomiting, or you cannot keep foods down.   You have pain that is limited to the right lower part of your abdomen.   You have swelling or increasing pain in your abdomen.   You see blood in your stool.   You have a fever or persistent symptoms for  more than 2 3 days.   You have a fever and your symptoms suddenly get worse. MAKE SURE YOU:   Understand these instructions.  Will watch your condition.  Will get help right away if you are not doing well or get worse. Document Released: 09/10/2000 Document Revised: 07/04/2013 Document Reviewed: 05/11/2013 [facility] Patient Information 2014 [employer].  ABDOMINAL SURGERY: POST OP INSTRUCTIONS  1. DIET: Follow a light bland diet the first 24 hours after arrival home, such as soup, liquids, crackers, etc.  Be sure to include lots of fluids daily.  Avoid fast food or heavy meals as your are more likely to get nauseated.  Eat a low fat the next few days after surgery.   2. Take your usually prescribed home medications unless otherwise directed. 3. PAIN CONTROL: a. Pain is best controlled by a usual combination of three different methods TOGETHER: i. Ice/Heat ii. Over the counter pain medication iii. Prescription pain medication b. Most patients will experience some swelling and bruising around the incisions.  Ice packs or heating pads (30-60 minutes up to 6 times a day) will help. Use ice for the first few days to help decrease swelling and bruising, then switch to heat to help relax tight/sore spots and speed recovery.  Some people prefer to use ice alone, heat alone, alternating between ice & heat.  Experiment to what works for you.  Swelling and bruising can take several weeks to resolve.   c. It is helpful to take an over-the-counter pain medication regularly for the first few weeks.  Choose one of the following that works best for you: i. Naproxen (Aleve, etc)  Two 220mg  tabs twice a day ii. Ibuprofen (Advil, etc) Three 200mg  tabs four times a day (every meal & bedtime) iii. Acetaminophen (Tylenol, etc) 500-650mg  four times a day (every meal & bedtime) d. A  prescription for pain medication (such as oxycodone, hydrocodone, etc) should be given to you upon discharge.  Take your pain  medication as prescribed.  i. If you are having problems/concerns with the prescription medicine (does not control pain, nausea, vomiting, rash, itching, etc), please call us [phone] to see if we need to switch you to a different pain medicine that will work better for you and/or control your side effect better. ii. If you need a refill on your pain medication, please contact your pharmacy.  They will contact our office to request authorization. Prescriptions will not be filled after 5 pm or on week-ends. 4. Avoid getting constipated.  Between the surgery and the pain medications, it is common to experience some constipation.  Increasing fluid intake and taking a fiber supplement (such as Metamucil, Citrucel, FiberCon, MiraLax, etc) 1-2 times a day regularly will usually help prevent this problem from occurring.  A mild laxative (prune juice, Milk of Magnesia, MiraLax, etc) should be taken according to package directions if there are no bowel movements after 48 hours.   5. Watch out for diarrhea.  If you have many loose bowel movements, simplify your diet to bland foods & liquids for a few days.  Stop any stool softeners and decrease your  fiber supplement.  Switching to mild anti-diarrheal medications (Kayopectate, Pepto Bismol) can help.  If this worsens or does not improve, please call us. 6. Wash / shower every day.  You may shower over the incision / wound.  Avoid baths until the skin is fully healed.  Continue to shower over incision(s) after the dressing is off. 7. Remove your waterproof bandages 5 days after surgery.  You may leave the incision open to air.  You may replace a dressing/Band-Aid to cover the incision for comfort if you wish. 8. ACTIVITIES as tolerated:   a. You may resume regular (light) daily activities beginning the next day-such as daily self-care, walking, climbing stairs-gradually increasing activities as tolerated.  If you can walk 30 minutes without difficulty, it is safe  to try more intense activity such as jogging, treadmill, bicycling, low-impact aerobics, swimming, etc. b. Save the most intensive and strenuous activity for last such as sit-ups, heavy lifting, contact sports, etc  Refrain from any heavy lifting or straining until you are off narcotics for pain control.   c. DO NOT PUSH THROUGH PAIN.  Let pain be your guide: If it hurts to do something, don't do it.  Pain is your body warning you to avoid that activity for another week until the pain goes down. d. You may drive when you are no longer taking prescription pain medication, you can comfortably wear a seatbelt, and you can safely maneuver your car and apply brakes. e. Dennis Bast may have sexual intercourse when it is comfortable.  9. FOLLOW UP in our office a. Please call [facility] at [phone] to set up an appointment to see your surgeon in the office for a follow-up appointment approximately 1-2 weeks after your surgery. b. Make sure that you call for this appointment the day you arrive home to insure a convenient appointment time. 10. IF YOU HAVE DISABILITY OR FAMILY LEAVE FORMS, BRING THEM TO THE OFFICE FOR PROCESSING.  DO NOT GIVE THEM TO YOUR DOCTOR.   WHEN TO CALL us [phone]: 1. Poor pain control 2. Reactions / problems with new medications (rash/itching, nausea, etc)  3. Fever over 101.5 F (38.5 C) 4. Inability to urinate 5. Nausea and/or vomiting 6. Worsening swelling or bruising 7. Continued bleeding from incision. 8. Increased pain, redness, or drainage from the incision  The clinic staff is available to answer your questions during regular business hours (8:30am-5pm).  Please don't hesitate to call and ask to speak to one of our nurses for clinical concerns.   A surgeon from [facility] is always on call at the hospitals   If you have a medical emergency, go to the nearest emergency room or call 911.    [facility], [address], [place],  [place], [place]  [address] ? MAIN: [phone] ? TOLL FREE: [phone] ? FAX [phone] [email or web address]   GETTING TO GOOD BOWEL HEALTH. Irregular bowel habits such as constipation and diarrhea can lead to many problems over time.  Having one soft bowel movement a day is the most important way to prevent further problems.  The anorectal canal is designed to handle stretching and feces to safely manage our ability to get rid of solid waste (feces, poop, stool) out of our body.  BUT, hard constipated stools can act like ripping concrete bricks and diarrhea can be a burning fire to this very sensitive area of our body, causing inflamed hemorrhoids, anal fissures, increasing risk is perirectal abscesses, abdominal  pain/bloating, an making irritable bowel worse.     The goal: ONE SOFT BOWEL MOVEMENT A DAY!  To have soft, regular bowel movements:    Drink at least 8 tall glasses of water a day.     Take plenty of fiber.  Fiber is the undigested part of plant food that passes into the colon, acting s "natures broom" to encourage bowel motility and movement.  Fiber can absorb and hold large amounts of water. This results in a larger, bulkier stool, which is soft and easier to pass. Work gradually over several weeks up to 6 servings a day of fiber (25g a day even more if needed) in the form of: o Vegetables -- Root (potatoes, carrots, turnips), leafy green (lettuce, salad greens, celery, spinach), or cooked high residue (cabbage, broccoli, etc) o Fruit -- Fresh (unpeeled skin & pulp), Dried (prunes, apricots, cherries, etc ),  or stewed ( applesauce)  o Whole grain breads, pasta, etc (whole wheat)  o Bran cereals    Bulking Agents -- This type of water-retaining fiber generally is easily obtained each day by one of the following:  o Psyllium bran -- The psyllium plant is remarkable because its ground seeds can retain so much water. This product is available as Metamucil, Konsyl,  Effersyllium, Per Diem Fiber, or the less expensive generic preparation in drug and health food stores. Although labeled a laxative, it really is not a laxative.  o Methylcellulose -- This is another fiber derived from wood which also retains water. It is available as Citrucel. o Polyethylene Glycol - and "artificial" fiber commonly called Miralax or Glycolax.  It is helpful for people with gassy or bloated feelings with regular fiber o Flax Seed - a less gassy fiber than psyllium   No reading or other relaxing activity while on the toilet. If bowel movements take longer than 5 minutes, you are too constipated   AVOID CONSTIPATION.  High fiber and water intake usually takes care of this.  Sometimes a laxative is needed to stimulate more frequent bowel movements, but    Laxatives are not a good long-term solution as it can wear the colon out. o Osmotics (Milk of Magnesia, Fleets phosphosoda, Magnesium citrate, MiraLax, GoLytely) are safer than  o Stimulants (Senokot, Castor Oil, Dulcolax, Ex Lax)    o Do not take laxatives for more than 7days in a row.    IF SEVERELY CONSTIPATED, try a Bowel Retraining Program: o Do not use laxatives.  o Eat a diet high in roughage, such as bran cereals and leafy vegetables.  o Drink six (6) ounces of prune or apricot juice each morning.  o Eat two (2) large servings of stewed fruit each day.  o Take one (1) heaping tablespoon of a psyllium-based bulking agent twice a day. Use sugar-free sweetener when possible to avoid excessive calories.  o Eat a normal breakfast.  o Set aside 15 minutes after breakfast to sit on the toilet, but do not strain to have a bowel movement.  o If you do not have a bowel movement by the third day, use an enema and repeat the above steps.    Controlling diarrhea o Switch to liquids and simpler foods for a few days to avoid stressing your intestines further. o Avoid dairy products (especially milk & ice cream) for a short time.  The  intestines often can lose the ability to digest lactose when stressed. o Avoid foods that cause gassiness or bloating.  Typical foods include  beans and other legumes, cabbage, broccoli, and dairy foods.  Every person has some sensitivity to other foods, so listen to our body and avoid those foods that trigger problems for you. o Adding fiber (Citrucel, Metamucil, psyllium, Miralax) gradually can help thicken stools by absorbing excess fluid and retrain the intestines to act more normally.  Slowly increase the dose over a few weeks.  Too much fiber too soon can backfire and cause cramping & bloating. o Probiotics (such as active yogurt, Align, etc) may help repopulate the intestines and colon with normal bacteria and calm down a sensitive digestive tract.  Most studies show it to be of mild help, though, and such products can be costly. o Medicines:   Bismuth subsalicylate (ex. Kayopectate, Pepto Bismol) every 30 minutes for up to 6 doses can help control diarrhea.  Avoid if pregnant.   Loperamide (Immodium) can slow down diarrhea.  Start with two tablets (4mg  total) first and then try one tablet every 6 hours.  Avoid if you are having fevers or severe pain.  If you are not better or start feeling worse, stop all medicines and call your doctor for advice o Call your doctor if you are getting worse or not better.  Sometimes further testing (cultures, endoscopy, X-ray studies, bloodwork, etc) may be needed to help diagnose and treat the cause of the diarrhea. o

## 2014-02-27 ENCOUNTER — Encounter (HOSPITAL_BASED_OUTPATIENT_CLINIC_OR_DEPARTMENT_OTHER): Payer: Self-pay | Admitting: *Deleted

## 2014-03-05 ENCOUNTER — Ambulatory Visit (HOSPITAL_BASED_OUTPATIENT_CLINIC_OR_DEPARTMENT_OTHER)
Admission: RE | Admit: 2014-03-05 | Discharge: 2014-03-05 | Disposition: A | Discharge status: Home / Self Care | Payer: BC Managed Care – PPO | Source: Ambulatory Visit | Attending: Surgery | Admitting: Surgery

## 2014-03-05 ENCOUNTER — Encounter (HOSPITAL_COMMUNITY)
Admission: RE | Disposition: A | Discharge status: Home / Self Care | Payer: Self-pay | Source: Ambulatory Visit | Attending: Surgery

## 2014-03-05 ENCOUNTER — Ambulatory Visit: Admit: 2014-03-05 | Payer: Self-pay | Admitting: Surgery

## 2014-03-05 ENCOUNTER — Encounter (HOSPITAL_BASED_OUTPATIENT_CLINIC_OR_DEPARTMENT_OTHER): Payer: BC Managed Care – PPO | Admitting: Certified Registered"

## 2014-03-05 ENCOUNTER — Encounter (HOSPITAL_BASED_OUTPATIENT_CLINIC_OR_DEPARTMENT_OTHER): Payer: Self-pay | Admitting: Certified Registered"

## 2014-03-05 ENCOUNTER — Ambulatory Visit (HOSPITAL_COMMUNITY): Payer: BC Managed Care – PPO | Admitting: Anesthesiology

## 2014-03-05 ENCOUNTER — Ambulatory Visit (HOSPITAL_BASED_OUTPATIENT_CLINIC_OR_DEPARTMENT_OTHER): Payer: BC Managed Care – PPO | Admitting: Certified Registered"

## 2014-03-05 ENCOUNTER — Encounter (HOSPITAL_COMMUNITY): Payer: BC Managed Care – PPO | Admitting: Anesthesiology

## 2014-03-05 DIAGNOSIS — R1904 Left lower quadrant abdominal swelling, mass and lump: Secondary | ICD-10-CM

## 2014-03-05 DIAGNOSIS — N806 Endometriosis in cutaneous scar: Secondary | ICD-10-CM | POA: Diagnosis present

## 2014-03-05 DIAGNOSIS — N808 Other endometriosis: Secondary | ICD-10-CM | POA: Insufficient documentation

## 2014-03-05 DIAGNOSIS — K5900 Constipation, unspecified: Secondary | ICD-10-CM | POA: Insufficient documentation

## 2014-03-05 DIAGNOSIS — Z79899 Other long term (current) drug therapy: Secondary | ICD-10-CM | POA: Insufficient documentation

## 2014-03-05 HISTORY — DX: Other seasonal allergic rhinitis: J30.2

## 2014-03-05 HISTORY — DX: Localized swelling, mass and lump, trunk: R22.2

## 2014-03-05 HISTORY — PX: UMBILICAL HERNIA REPAIR: SHX196

## 2014-03-05 LAB — HCG, SERUM, QUALITATIVE: PREG SERUM: NEGATIVE

## 2014-03-05 SURGERY — REPAIR, HERNIA, UMBILICAL, ADULT
Anesthesia: General

## 2014-03-05 SURGERY — REPAIR, HERNIA, UMBILICAL, ADULT
Anesthesia: General | Site: Abdomen

## 2014-03-05 MED ORDER — KETOROLAC TROMETHAMINE 30 MG/ML IJ SOLN: 15.0000 mg | Freq: Once | INTRAMUSCULAR | Status: DC | PRN

## 2014-03-05 MED ORDER — FENTANYL CITRATE 0.05 MG/ML IJ SOLN
INTRAMUSCULAR | Status: DC | PRN
  Administered 2014-03-05: 25 ug via INTRAVENOUS
  Administered 2014-03-05 (×2): 100 ug via INTRAVENOUS
  Administered 2014-03-05: 50 ug via INTRAVENOUS
  Administered 2014-03-05: 100 ug via INTRAVENOUS
  Administered 2014-03-05: 25 ug via INTRAVENOUS

## 2014-03-05 MED ORDER — FENTANYL CITRATE 0.05 MG/ML IJ SOLN
INTRAMUSCULAR | Status: AC
  Filled 2014-03-05: qty 5

## 2014-03-05 MED ORDER — BUPIVACAINE-EPINEPHRINE 0.25% -1:200000 IJ SOLN
INTRAMUSCULAR | Status: DC | PRN
  Administered 2014-03-05: 60 mL

## 2014-03-05 MED ORDER — GLYCOPYRROLATE 0.2 MG/ML IJ SOLN
INTRAMUSCULAR | Status: DC | PRN
  Administered 2014-03-05: .6 mg via INTRAVENOUS

## 2014-03-05 MED ORDER — ACETAMINOPHEN 325 MG PO TABS: 325.0000 mg | ORAL_TABLET | ORAL | Status: DC | PRN

## 2014-03-05 MED ORDER — PROMETHAZINE HCL 25 MG/ML IJ SOLN
6.2500 mg | INTRAMUSCULAR | Status: DC | PRN
  Administered 2014-03-05: 6.25 mg via INTRAVENOUS

## 2014-03-05 MED ORDER — FENTANYL CITRATE 0.05 MG/ML IJ SOLN: 50.0000 ug | INTRAMUSCULAR | Status: DC | PRN

## 2014-03-05 MED ORDER — MIDAZOLAM HCL 2 MG/2ML IJ SOLN
INTRAMUSCULAR | Status: AC
  Filled 2014-03-05: qty 2

## 2014-03-05 MED ORDER — LACTATED RINGERS IV SOLN
INTRAVENOUS | Status: DC
  Administered 2014-03-05: 15:00:00 via INTRAVENOUS

## 2014-03-05 MED ORDER — PROPOFOL 10 MG/ML IV BOLUS
INTRAVENOUS | Status: AC
  Filled 2014-03-05: qty 20

## 2014-03-05 MED ORDER — FENTANYL CITRATE 0.05 MG/ML IJ SOLN: 25.0000 ug | INTRAMUSCULAR | Status: DC | PRN

## 2014-03-05 MED ORDER — BUPIVACAINE-EPINEPHRINE (PF) 0.25% -1:200000 IJ SOLN
INTRAMUSCULAR | Status: AC
  Filled 2014-03-05: qty 30

## 2014-03-05 MED ORDER — LIDOCAINE HCL (CARDIAC) 20 MG/ML IV SOLN
INTRAVENOUS | Status: AC
  Filled 2014-03-05: qty 5

## 2014-03-05 MED ORDER — LACTATED RINGERS IV SOLN
INTRAVENOUS | Status: DC | PRN
  Administered 2014-03-05 (×2): via INTRAVENOUS

## 2014-03-05 MED ORDER — CEFAZOLIN SODIUM-DEXTROSE 2-3 GM-% IV SOLR
2.0000 g | INTRAVENOUS | Status: DC
Start: 2014-03-06 — End: 2014-03-05

## 2014-03-05 MED ORDER — OXYCODONE HCL 5 MG PO TABS: 5.0000 mg | ORAL_TABLET | Freq: Once | ORAL | Status: DC | PRN

## 2014-03-05 MED ORDER — PROPOFOL 10 MG/ML IV BOLUS
INTRAVENOUS | Status: DC | PRN
  Administered 2014-03-05: 200 mg via INTRAVENOUS

## 2014-03-05 MED ORDER — DEXAMETHASONE SODIUM PHOSPHATE 4 MG/ML IJ SOLN
INTRAMUSCULAR | Status: DC | PRN
  Administered 2014-03-05: 4 mg via INTRAVENOUS

## 2014-03-05 MED ORDER — LIDOCAINE HCL (CARDIAC) 20 MG/ML IV SOLN
INTRAVENOUS | Status: DC | PRN
  Administered 2014-03-05: 100 mg via INTRAVENOUS

## 2014-03-05 MED ORDER — NEOSTIGMINE METHYLSULFATE 10 MG/10ML IV SOLN
INTRAVENOUS | Status: DC | PRN
  Administered 2014-03-05: 4 mg via INTRAVENOUS

## 2014-03-05 MED ORDER — MIDAZOLAM HCL 5 MG/5ML IJ SOLN
INTRAMUSCULAR | Status: DC | PRN
  Administered 2014-03-05: 2 mg via INTRAVENOUS

## 2014-03-05 MED ORDER — MIDAZOLAM HCL 2 MG/2ML IJ SOLN: 1.0000 mg | INTRAMUSCULAR | Status: DC | PRN

## 2014-03-05 MED ORDER — HYDROCODONE-ACETAMINOPHEN 5-325 MG PO TABS: 1.0000 | ORAL_TABLET | ORAL | Status: DC | PRN

## 2014-03-05 MED ORDER — NEOSTIGMINE METHYLSULFATE 10 MG/10ML IV SOLN
INTRAVENOUS | Status: AC
  Filled 2014-03-05: qty 1

## 2014-03-05 MED ORDER — ROCURONIUM BROMIDE 50 MG/5ML IV SOLN
INTRAVENOUS | Status: AC
  Filled 2014-03-05: qty 1

## 2014-03-05 MED ORDER — ONDANSETRON HCL 4 MG/2ML IJ SOLN
INTRAMUSCULAR | Status: AC
  Filled 2014-03-05: qty 2

## 2014-03-05 MED ORDER — ACETAMINOPHEN 160 MG/5ML PO SOLN
325.0000 mg | ORAL | Status: DC | PRN
  Filled 2014-03-05: qty 20.3

## 2014-03-05 MED ORDER — CHLORHEXIDINE GLUCONATE 4 % EX LIQD: 1.0000 "application " | Freq: Once | CUTANEOUS | Status: DC

## 2014-03-05 MED ORDER — ROCURONIUM BROMIDE 100 MG/10ML IV SOLN
INTRAVENOUS | Status: DC | PRN
  Administered 2014-03-05: 40 mg via INTRAVENOUS

## 2014-03-05 MED ORDER — ONDANSETRON HCL 4 MG/2ML IJ SOLN
INTRAMUSCULAR | Status: DC | PRN
  Administered 2014-03-05 (×2): 4 mg via INTRAVENOUS

## 2014-03-05 MED ORDER — CHLORHEXIDINE GLUCONATE 4 % EX LIQD
1.0000 | Freq: Once | CUTANEOUS | Status: DC
Start: 2014-03-06 — End: 2014-03-05

## 2014-03-05 MED ORDER — OXYCODONE HCL 5 MG/5ML PO SOLN: 5.0000 mg | Freq: Once | ORAL | Status: DC | PRN

## 2014-03-05 MED ORDER — PROMETHAZINE HCL 25 MG/ML IJ SOLN
INTRAMUSCULAR | Status: AC
  Filled 2014-03-05: qty 1

## 2014-03-05 MED ORDER — FENTANYL CITRATE 0.05 MG/ML IJ SOLN
INTRAMUSCULAR | Status: AC
Start: 2014-03-05 — End: 2014-03-05
  Filled 2014-03-05: qty 6

## 2014-03-05 MED ORDER — CEFAZOLIN SODIUM-DEXTROSE 2-3 GM-% IV SOLR
INTRAVENOUS | Status: AC
  Filled 2014-03-05: qty 50

## 2014-03-05 MED ORDER — LACTATED RINGERS IV SOLN: INTRAVENOUS | Status: DC

## 2014-03-05 MED ORDER — GLYCOPYRROLATE 0.2 MG/ML IJ SOLN
INTRAMUSCULAR | Status: AC
  Filled 2014-03-05: qty 3

## 2014-03-05 SURGICAL SUPPLY — 46 items
BLADE SURG ROTATE 9660 (MISCELLANEOUS) IMPLANT
CANISTER SUCTION 2500CC (MISCELLANEOUS) IMPLANT
CHLORAPREP W/TINT 26ML (MISCELLANEOUS) ×3 IMPLANT
COVER SURGICAL LIGHT HANDLE (MISCELLANEOUS) ×3 IMPLANT
DECANTER SPIKE VIAL GLASS SM (MISCELLANEOUS) ×3 IMPLANT
DRAPE PED LAPAROTOMY (DRAPES) ×3 IMPLANT
DRSG TEGADERM 4X4.75 (GAUZE/BANDAGES/DRESSINGS) ×6 IMPLANT
ELECT CAUTERY BLADE 6.4 (BLADE) ×3 IMPLANT
ELECT REM PT RETURN 9FT ADLT (ELECTROSURGICAL) ×3
ELECTRODE REM PT RTRN 9FT ADLT (ELECTROSURGICAL) ×1 IMPLANT
GAUZE SPONGE 2X2 8PLY STRL LF (GAUZE/BANDAGES/DRESSINGS) ×1 IMPLANT
GLOVE BIO SURGEON STRL SZ 6.5 (GLOVE) ×2 IMPLANT
GLOVE BIO SURGEONS STRL SZ 6.5 (GLOVE) ×1
GLOVE BIOGEL PI IND STRL 6.5 (GLOVE) ×1 IMPLANT
GLOVE BIOGEL PI IND STRL 8 (GLOVE) ×1 IMPLANT
GLOVE BIOGEL PI INDICATOR 6.5 (GLOVE) ×2
GLOVE BIOGEL PI INDICATOR 8 (GLOVE) ×2
GLOVE ECLIPSE 8.0 STRL XLNG CF (GLOVE) ×3 IMPLANT
GOWN STRL REUS W/ TWL LRG LVL3 (GOWN DISPOSABLE) ×3 IMPLANT
GOWN STRL REUS W/ TWL XL LVL3 (GOWN DISPOSABLE) ×1 IMPLANT
GOWN STRL REUS W/TWL LRG LVL3 (GOWN DISPOSABLE) ×6
GOWN STRL REUS W/TWL XL LVL3 (GOWN DISPOSABLE) ×2
KIT BASIN OR (CUSTOM PROCEDURE TRAY) ×3 IMPLANT
KIT ROOM TURNOVER OR (KITS) ×3 IMPLANT
NEEDLE 22X1 1/2 (OR ONLY) (NEEDLE) ×3 IMPLANT
NS IRRIG 1000ML POUR BTL (IV SOLUTION) ×3 IMPLANT
PACK SURGICAL SETUP 50X90 (CUSTOM PROCEDURE TRAY) ×3 IMPLANT
PAD ARMBOARD 7.5X6 YLW CONV (MISCELLANEOUS) ×6 IMPLANT
PENCIL BUTTON HOLSTER BLD 10FT (ELECTRODE) ×3 IMPLANT
SPONGE GAUZE 2X2 STER 10/PKG (GAUZE/BANDAGES/DRESSINGS) ×2
SPONGE LAP 18X18 X RAY DECT (DISPOSABLE) ×3 IMPLANT
SUT ETHIBOND NAB CT1 #1 30IN (SUTURE) IMPLANT
SUT MNCRL AB 4-0 PS2 18 (SUTURE) ×3 IMPLANT
SUT PDS AB 1 CT  36 (SUTURE) ×4
SUT PDS AB 1 CT 36 (SUTURE) ×2 IMPLANT
SUT PROLENE 0 CT 1 30 (SUTURE) ×12 IMPLANT
SUT VIC AB 0 CT1 27 (SUTURE) ×2
SUT VIC AB 0 CT1 27XBRD ANBCTR (SUTURE) ×1 IMPLANT
SUT VICRYL AB 2 0 TIES (SUTURE) ×3 IMPLANT
SYR BULB 3OZ (MISCELLANEOUS) ×3 IMPLANT
SYR CONTROL 10ML LL (SYRINGE) ×3 IMPLANT
TOWEL OR 17X24 6PK STRL BLUE (TOWEL DISPOSABLE) ×3 IMPLANT
TOWEL OR 17X26 10 PK STRL BLUE (TOWEL DISPOSABLE) ×3 IMPLANT
TUBE CONNECTING 12'X1/4 (SUCTIONS)
TUBE CONNECTING 12X1/4 (SUCTIONS) IMPLANT
YANKAUER SUCT BULB TIP NO VENT (SUCTIONS) IMPLANT

## 2014-03-05 SURGICAL SUPPLY — 39 items
BENZOIN TINCTURE PRP APPL 2/3 (GAUZE/BANDAGES/DRESSINGS) IMPLANT
BLADE 15 SAFETY STRL DISP (BLADE) IMPLANT
BLADE SURG ROTATE 9660 (MISCELLANEOUS) IMPLANT
CLOSURE WOUND 1/2 X4 (GAUZE/BANDAGES/DRESSINGS)
COVER MAYO STAND STRL (DRAPES) IMPLANT
COVER TABLE BACK 60X90 (DRAPES) IMPLANT
DECANTER SPIKE VIAL GLASS SM (MISCELLANEOUS) IMPLANT
DERMABOND ADVANCED (GAUZE/BANDAGES/DRESSINGS)
DERMABOND ADVANCED .7 DNX12 (GAUZE/BANDAGES/DRESSINGS) IMPLANT
DRAPE PED LAPAROTOMY (DRAPES) IMPLANT
DRAPE UTILITY XL STRL (DRAPES) IMPLANT
DURAPREP 26ML APPLICATOR (WOUND CARE) IMPLANT
ELECT REM PT RETURN 9FT ADLT (ELECTROSURGICAL)
ELECTRODE REM PT RTRN 9FT ADLT (ELECTROSURGICAL) IMPLANT
GLOVE BIOGEL PI IND STRL 8 (GLOVE) IMPLANT
GLOVE BIOGEL PI INDICATOR 8 (GLOVE)
GLOVE ECLIPSE 8.0 STRL XLNG CF (GLOVE) IMPLANT
GOWN STRL REUS W/ TWL LRG LVL3 (GOWN DISPOSABLE) IMPLANT
GOWN STRL REUS W/TWL LRG LVL3 (GOWN DISPOSABLE)
NEEDLE HYPO 22GX1.5 SAFETY (NEEDLE) IMPLANT
NEEDLE HYPO 25X1 1.5 SAFETY (NEEDLE) IMPLANT
NS IRRIG 1000ML POUR BTL (IV SOLUTION) IMPLANT
PACK BASIN DAY SURGERY FS (CUSTOM PROCEDURE TRAY) IMPLANT
PENCIL BUTTON HOLSTER BLD 10FT (ELECTRODE) IMPLANT
SLEEVE SCD COMPRESS KNEE MED (MISCELLANEOUS) IMPLANT
SPONGE GAUZE 4X4 12PLY STER LF (GAUZE/BANDAGES/DRESSINGS) IMPLANT
STRIP CLOSURE SKIN 1/2X4 (GAUZE/BANDAGES/DRESSINGS) IMPLANT
SUT MON AB 4-0 PC3 18 (SUTURE) IMPLANT
SUT PROLENE 0 CT 1 30 (SUTURE) IMPLANT
SUT SILK 3 0 SH 30 (SUTURE) IMPLANT
SUT VIC AB 3-0 54X BRD REEL (SUTURE) IMPLANT
SUT VIC AB 3-0 BRD 54 (SUTURE)
SUT VIC AB 3-0 SH 27 (SUTURE)
SUT VIC AB 3-0 SH 27X BRD (SUTURE) IMPLANT
SUT VICRYL 0 UR6 27IN ABS (SUTURE) IMPLANT
SYR CONTROL 10ML LL (SYRINGE) IMPLANT
TOWEL OR 17X24 6PK STRL BLUE (TOWEL DISPOSABLE) IMPLANT
TOWEL OR NON WOVEN STRL DISP B (DISPOSABLE) IMPLANT
TRAY DSU PREP LF (CUSTOM PROCEDURE TRAY) IMPLANT

## 2014-03-05 NOTE — Op Note (Signed)
03/05/2014  8:10 PM  PATIENT:  Mary Cannon  28 y.o. female  Patient Care Team: Antony Blackbird, MD as PCP - General (Family Medicine) Luz Lex, MD as Consulting Physician (Obstetrics and Gynecology)  PRE-OPERATIVE DIAGNOSIS:  abdominal wall mass  POST-OPERATIVE DIAGNOSIS:  abdominal wall fascia mass ?endometrioma  PROCEDURE:  Procedure(s): open abdominal wall exploration with removal of abdominal wall mass   SURGEON:  Surgeon(s): [name], MD  ASSISTANT: RN   ANESTHESIA:   local and general  EBL:  Total I/O In: 1000 [I.V.:1000] Out: 25 [Blood:25]  Delay start of Pharmacological VTE agent (>24hrs) due to surgical blood loss or risk of bleeding:  no  DRAINS: none   SPECIMEN:  Source of Specimen:  Abdominal wall SQ/fascial masses x 3 (stitch at most suspicious lesion)  DISPOSITION OF SPECIMEN:  PATHOLOGY  COUNTS:  YES  PLAN OF CARE: Discharge to home after PACU  PATIENT DISPOSITION:  PACU - hemodynamically stable.  INDICATION:   Pleasant oung woman status post cesarean section in the past.  Noticed left lower quadrant pain with exercise.  Concern for a mass.  Persistent discomfort did seem to be related to the menstrual cycle.  Concern for endometrioma.  Surgical consultation requested.  I offered abdominal wall exploration with removal of the mass  The pathophysiology of skin & subcutaneous masses was discussed.  Natural history risks without surgery were discussed.  I recommended surgery to remove the mass.  I explained the technique of removal with use of local anesthesia & possible need for more aggressive sedation/anesthesia for patient comfort.    Risks such as bleeding, infection, heart attack, death, and other risks were discussed.  I noted a good likelihood this will help address the problem.   Possibility that this will not correct all symptoms was explained. Possibility of regrowth/recurrence of the mass was discussed.  We will work to minimize  complications. Questions were answered.  The patient expresses understanding & wishes to proceed with surgery.  OR FINDINGS: Patient had firm fibrous mass within 3cm region in the left lower quadrant/suprapubic region along the scar of the prior cesarean section.  Adherent to the fascia.  Correlated with a suspicious mass on MRI.Marland Kitchen  Some areas of fibrous scar along the midline and primarily left lateral fashion.  Therefore removed as well  DESCRIPTION: Informed consent was confirmed.  Patient underwent procedure without difficulty.  She positioned supine.  Her abdomen  And mons pubis were clipped prepped and draped in sterile fashion.  Surgical timeout confirmed our plan.  I entered in the abdominal wall through the prior cesarean section Pfannenstiel incision.  Came through subcutaneous tissues rather easily & came to the fascia.  I elevated the skin flaps up towards the umbilicus & inferiorly to the pubis to help expose the entire anterior fascia region.  Felt no abnormalities more superiorly.  Did note fibrous scarring at the fascia.  More distinct nodule noted in along the left lateral aspect of the fascial closure just cephalad to the incision.  Correlated with the suprapubic mass adherent to fascia seen on MRI.  I  went ahead and excised it using cautery around and a healthy cuff of subcutaneous fibrous tissue.  I did have to take a 2.5 x 1.5cm button of fascia as well to more completely excise it in toto.  Stitch placed in the most firm nodule in that mass excised.  I did feel some fibrous material along the fascia that I excised with cautery as well.  We did irrigation & ensured hemostasis.    I did re-inspection of the anterior abdominal wall fascia superiorly and inferiorly.  The rest of the tissue seemed soft and normal.  No other masses felt.  I reapproximated the small fascial defects transversely along the prior Pfannenstiel closure using #1 running PDS to good result.  Skin closed with 4  Monocryl.  Sterile dressings applied.  Patient being extubated.  I discussed postop care in detail with the patient's husband.  Instructions are written.  We will have them followup in a few weeks.

## 2014-03-05 NOTE — Anesthesia Procedure Notes (Signed)
Procedure Name: Intubation Date/Time: 03/05/2014 7:07 PM Performed by: Octavio Graves Pre-anesthesia Checklist: Patient identified, Timeout performed, Emergency Drugs available, Suction available and Patient being monitored Patient Re-evaluated:Patient Re-evaluated prior to inductionOxygen Delivery Method: Circle system utilized Preoxygenation: Pre-oxygenation with 100% oxygen Intubation Type: IV induction Ventilation: Mask ventilation without difficulty Laryngoscope Size: Miller and 2 Grade View: Grade I Tube type: Oral Tube size: 7.0 mm Number of attempts: 1 Airway Equipment and Method: Stylet Placement Confirmation: ETT inserted through vocal cords under direct vision,  positive ETCO2 and breath sounds checked- equal and bilateral Secured at: 22 cm Tube secured with: Tape Dental Injury: Teeth and Oropharynx as per pre-operative assessment  Comments: IV induction Tobias Alexander- AM CRNA intubation atraumatic teeth and mouth as preop- bilat BS Tobias Alexander

## 2014-03-05 NOTE — Anesthesia Preprocedure Evaluation (Signed)
Anesthesia Evaluation  Patient identified by MRN, date of birth, ID band Patient awake    Reviewed: Allergy & Precautions, H&P , NPO status , Patient's Chart, lab work & pertinent test results  History of Anesthesia Complications Negative for: history of anesthetic complications  Airway Mallampati: II TM Distance: >3 FB Neck ROM: Full    Dental  (+) Teeth Intact   Pulmonary neg pulmonary ROS,  breath sounds clear to auscultation        Cardiovascular negative cardio ROS  Rhythm:Regular     Neuro/Psych negative neurological ROS  negative psych ROS   GI/Hepatic Neg liver ROS, Abdominal mass   Endo/Other  negative endocrine ROS  Renal/GU negative Renal ROS     Musculoskeletal   Abdominal   Peds  Hematology negative hematology ROS (+)   Anesthesia Other Findings   Reproductive/Obstetrics                           Anesthesia Physical  Anesthesia Plan  ASA: II  Anesthesia Plan: General   Post-op Pain Management:    Induction: Intravenous  Airway Management Planned:   Additional Equipment: None  Intra-op Plan:   Post-operative Plan: Extubation in OR  Informed Consent: I have reviewed the patients History and Physical, chart, labs and discussed the procedure including the risks, benefits and alternatives for the proposed anesthesia with the patient or authorized representative who has indicated his/her understanding and acceptance.   Dental advisory given  Plan Discussed with: CRNA and Surgeon  Anesthesia Plan Comments:         Anesthesia Quick Evaluation

## 2014-03-05 NOTE — Anesthesia Preprocedure Evaluation (Deleted)
Anesthesia Evaluation  Patient identified by MRN, date of birth, ID band Patient awake    Reviewed: Allergy & Precautions, H&P , NPO status , Patient's Chart, lab work & pertinent test results  History of Anesthesia Complications Negative for: history of anesthetic complications  Airway Mallampati: II TM Distance: >3 FB Neck ROM: Full    Dental  (+) Teeth Intact   Pulmonary neg pulmonary ROS,  breath sounds clear to auscultation        Cardiovascular negative cardio ROS  Rhythm:Regular     Neuro/Psych negative neurological ROS  negative psych ROS   GI/Hepatic Neg liver ROS, Abdominal mass   Endo/Other  negative endocrine ROS  Renal/GU negative Renal ROS     Musculoskeletal   Abdominal   Peds  Hematology negative hematology ROS (+)   Anesthesia Other Findings   Reproductive/Obstetrics                           Anesthesia Physical Anesthesia Plan  ASA: II  Anesthesia Plan: General   Post-op Pain Management:    Induction: Intravenous  Airway Management Planned:   Additional Equipment: None  Intra-op Plan:   Post-operative Plan: Extubation in OR  Informed Consent: I have reviewed the patients History and Physical, chart, labs and discussed the procedure including the risks, benefits and alternatives for the proposed anesthesia with the patient or authorized representative who has indicated his/her understanding and acceptance.   Dental advisory given  Plan Discussed with: CRNA and Surgeon  Anesthesia Plan Comments:         Anesthesia Quick Evaluation

## 2014-03-05 NOTE — Interval H&P Note (Signed)
History and Physical Interval Note:  03/05/2014 6:38 PM  Mary Cannon  has presented today for surgery, with the diagnosis of abdominal wall mass  The various methods of treatment have been discussed with the patient and family. After consideration of risks, benefits and other options for treatment, the patient has consented to  Procedure(s): open abdominal wall exploration with removal of mass possible mesh repair  (N/A) as a surgical intervention .  The patient's history has been reviewed, patient examined, no change in status, stable for surgery.  I have reviewed the patient's chart and labs.  Questions were answered to the patient's satisfaction.     [name]

## 2014-03-05 NOTE — H&P (View-Only) (Signed)
Subjective:     Patient ID: Mary Cannon, female   DOB: [birth year], 28 y.o.   MRN: [id number]  HPI  Note: This dictation was prepared with Dragon/digital dictation along with [facility] technology. Any transcriptional errors that result from this process are unintentional.       Mary Cannon  1986-03-02 [id number]  Patient Care Team: Antony Blackbird, MD as PCP - General (Family Medicine) Luz Lex, MD as Consulting Physician (Obstetrics and Gynecology)  This patient is a 28 y.o.female who presents today for surgical evaluation at the request of Dr. Corinna Capra.   Reason for visit: Mass in abdominal wall.  Suspicious for endometrioma  Pleasant female.  She comes today with her husband.  Started noting his pain in the left lower quadrant were abdomen with exercise year ago.  Seemed to be related to that only.  There became more constant.  She feels she could feel a lump.  She noted the pain would get worse with her menstrual cycles and persistent 2 weeks afterwards.  She does have chronic constipation but that has not changed.  She is rather active as a parent.  No history of infections or problems with her C-section surgery in 2012.  No other surgeries.  Ultrasound and MRI concerning for mass at the level of the fascia.  Endometrioma suspected given change with her menstrual cycle & location.  The patient recalls some question of pelvic endometriosis that improved since  her pregnancy.  Occasionally takes ibuprofen for menstrual cycle but not severe.  Cannot tolerate any hormonal therapy as it causes significant mood swings.  They tried a NuvaRing which is low hormone dose and she could not tolerate it.  Patient Active Problem List   Diagnosis Date Noted  . Abdominal wall mass of left lower quadrant, probable endometrioma 02/04/2014  . Constipation, chronic 02/04/2014    Past Medical History  Diagnosis Date  . Endometriosis   . Ovarian cyst     Past Surgical History  Procedure  Laterality Date  . Cesarean section      History   Social History  . Marital Status: Married    Spouse Name: N/A    Number of Children: N/A  . Years of Education: N/A   Occupational History  . Not on file.   Social History Main Topics  . Smoking status: Never Smoker   . Smokeless tobacco: Not on file  . Alcohol Use: Yes     Comment: social  . Drug Use: No  . Sexual Activity: Not on file   Other Topics Concern  . Not on file   Social History Narrative  . No narrative on file    History reviewed. No pertinent family history.  Current Outpatient Prescriptions  Medication Sig Dispense Refill  . HYDROcodone-acetaminophen (NORCO/VICODIN) 5-325 MG per tablet Take 1 tablet by mouth every 6 (six) hours as needed for moderate pain or severe pain.       No current facility-administered medications for this visit.     Allergies  Allergen Reactions  . Azo [Phenazopyridine] Shortness Of Breath and Nausea And Vomiting    Elevated heart rate    BP 110/80  Pulse 80  Temp(Src) 97.2 F (36.2 C) (Temporal)  Resp 14  Ht 5\' 4"  (1.626 m)  Wt 164 lb (74.39 kg)  BMI 28.14 kg/m2  No results found.   Review of Systems  Constitutional: Negative for fever, chills, diaphoresis, appetite change and fatigue.  HENT: Negative for ear discharge, ear pain, sore throat  and trouble swallowing.   Eyes: Negative for photophobia, discharge and visual disturbance.  Respiratory: Negative for cough, choking, chest tightness and shortness of breath.   Cardiovascular: Negative for chest pain and palpitations.  Gastrointestinal: Positive for abdominal pain. Negative for nausea, vomiting, diarrhea, constipation, anal bleeding and rectal pain.  Endocrine: Negative for cold intolerance and heat intolerance.  Genitourinary: Negative for dysuria, frequency and difficulty urinating.  Musculoskeletal: Negative for gait problem, myalgias and neck pain.  Skin: Negative for color change, pallor and rash.    Allergic/Immunologic: Negative for environmental allergies, food allergies and immunocompromised state.  Neurological: Negative for dizziness, speech difficulty, weakness and numbness.  Hematological: Negative for adenopathy.  Psychiatric/Behavioral: Negative for confusion and agitation. The patient is not nervous/anxious.        Objective:   Physical Exam  Constitutional: She is oriented to person, place, and time. She appears well-developed and well-nourished. No distress.  HENT:  Head: Normocephalic.  Mouth/Throat: Oropharynx is clear and moist. No oropharyngeal exudate.  Eyes: Conjunctivae and EOM are normal. Pupils are equal, round, and reactive to light. No scleral icterus.  Neck: Normal range of motion. Neck supple. No tracheal deviation present.  Cardiovascular: Normal rate, regular rhythm and intact distal pulses.   Pulmonary/Chest: Effort normal and breath sounds normal. No stridor. No respiratory distress. She exhibits no tenderness.  Abdominal: Soft. She exhibits no distension and no mass. There is no tenderness. Hernia confirmed negative in the right inguinal area and confirmed negative in the left inguinal area.    Genitourinary: No vaginal discharge found.  Musculoskeletal: Normal range of motion. She exhibits no tenderness.       Right elbow: She exhibits normal range of motion.       Left elbow: She exhibits normal range of motion.       Right wrist: She exhibits normal range of motion.       Left wrist: She exhibits normal range of motion.       Right hand: Normal strength noted.       Left hand: Normal strength noted.  Lymphadenopathy:       Head (right side): No posterior auricular adenopathy present.       Head (left side): No posterior auricular adenopathy present.    She has no cervical adenopathy.    She has no axillary adenopathy.       Right: No inguinal adenopathy present.       Left: No inguinal adenopathy present.  Neurological: She is alert and  oriented to person, place, and time. No cranial nerve deficit. She exhibits normal muscle tone. Coordination normal.  Skin: Skin is warm and dry. No rash noted. She is not diaphoretic. No erythema.  Psychiatric: She has a normal mood and affect. Her behavior is normal. Judgment and thought content normal.   CLINICAL DATA: Anterior pelvic pain along C-section scar, with  hypoechoic subcutaneous nodule(s) in this vicinity, query  endometriomas.  EXAM:  MRI PELVIS WITHOUT AND WITH CONTRAST  TECHNIQUE:  Multiplanar multisequence MR imaging of the pelvis was performed  both before and after administration of intravenous contrast.  CONTRAST: 51mL MULTIHANCE GADOBENATE DIMEGLUMINE 529 MG/ML IV SOLN  COMPARISON: US TRANSVAGINAL NON-OB dated 12/07/2013  FINDINGS:  1.2 x 0.8 cm focus of low grade nodular enhancement along the  anterior margin of the left rectus abdominus muscle in the vicinity  of the C-section scar. Punctate regions of gradient bloom along the  C-section scar are also present, probably representing artifact from  suture material or chronic hemosiderin/blood products.  Trace pelvic ascites. The cesarean section scar noted in the uterus.  Endometrium 9 mm in thickness with transitional zone normal. Uterine  length 9.7 cm. Right ovary 2.7 x 3.7 x 3.1 cm with suspected  posterior cyst and anterior corpus luteum. Left ovary 2.9 x 1.6 by  2.0 cm, normal in appearance.  Urinary bladder unremarkable. Trace free fluid in the right  pericolic gutter. Gradient below along the lower uterine segment  compatible with suture material. On large field of view images a 1.3  cm T2 hyperintensity in the L3 vertebral body on the left (image 11,  series 2) is probably a hemangioma but technically nonspecific.  Given the patient's age I do not think this merits further workup.  IMPRESSION:  1. MRI confirms a single small enhancing nodule along the c-section  scar with epicenter 1.7 cm lateral to the  linea alba. This structure  is faintly enhancing, and although not specific for endometrioma it  could well represent an endometrioma, particularly if the patient's  pain at this site is cyclic.  2. Numerous foci of bloom artifact along the C-section scar and  along the anterior portion of lower uterine segment, compatible with  suture material and/or chronic hemosiderin.  3. Trace amount of free pelvic fluid. Suspected anterior corpus  luteum in the right ovary, and 2.2 cm right posterior ovarian cyst.  Electronically Signed  By: Sherryl Barters M.D.  On: 12/23/2013 15:17             External Result Report    External Result Report            Imaging       Assessment:     Abdominal wall mass ~2cm at level of fascia/muscle left lower quadrant just above the Pfannenstiel incision.  Suspicious for endometrioma     Plan:     Discuss with the patient and her husband.  I called and discussed with her gynecologist Dr. Corinna Capra.  I suspect this will require removal.  She has very poor tolerance to hormonal therapy and did not tolerate even the NuvaRing which is very low dose.  Dr. Corinna Capra does not feel that the patient requires laparoscopic exploration for a pelvic endometriosis.  Patient is not strongly interested in that.    Usually this requires an abdominal exploration of the fascia with excision.  Probably will need a patch of mesh to cover the defect as it is hard to primarily close.  We will see.  Hopefully no need for a drain postoperatively but sometimes that is needed as well temporarily.  She does wish to proceed with surgery.  I discussed this with the patient and her husband:   The pathophysiology of abdominal wall masses was discussed.  Natural history risks without surgery were discussed.  I recommended surgery to remove the mass.  I explained the technique of removal with use of anesthesia for patient comfort.    Risks such as bleeding, infection, heart attack, death,  and other risks were discussed.  I noted a good likelihood this will help address the problem.   Possibility that this will not correct all symptoms was explained. Possibility of regrowth/recurrence of the mass was discussed.  We will work to minimize complications. Questions were answered.  The patient expresses understanding & wishes to proceed with surgery.

## 2014-03-05 NOTE — Discharge Instructions (Signed)
ABDOMINAL SURGERY: POST OP INSTRUCTIONS ° °1. DIET: Follow a light bland diet the first 24 hours after arrival home, such as soup, liquids, crackers, etc.  Be sure to include lots of fluids daily.  Avoid fast food or heavy meals as your are more likely to get nauseated.  Eat a low fat the next few days after surgery.   °2. Take your usually prescribed home medications unless otherwise directed. °3. PAIN CONTROL: °a. Pain is best controlled by a usual combination of three different methods TOGETHER: °i. Ice/Heat °ii. Over the counter pain medication °iii. Prescription pain medication °b. Most patients will experience some swelling and bruising around the incisions.  Ice packs or heating pads (30-60 minutes up to 6 times a day) will help. Use ice for the first few days to help decrease swelling and bruising, then switch to heat to help relax tight/sore spots and speed recovery.  Some people prefer to use ice alone, heat alone, alternating between ice & heat.  Experiment to what works for you.  Swelling and bruising can take several weeks to resolve.   °c. It is helpful to take an over-the-counter pain medication regularly for the first few weeks.  Choose one of the following that works best for you: °i. Naproxen (Aleve, etc)  Two 220mg tabs twice a day °ii. Ibuprofen (Advil, etc) Three 200mg tabs four times a day (every meal & bedtime) °iii. Acetaminophen (Tylenol, etc) 500-650mg four times a day (every meal & bedtime) °d. A  prescription for pain medication (such as oxycodone, hydrocodone, etc) should be given to you upon discharge.  Take your pain medication as prescribed.  °i. If you are having problems/concerns with the prescription medicine (does not control pain, nausea, vomiting, rash, itching, etc), please call us [phone] to see if we need to switch you to a different pain medicine that will work better for you and/or control your side effect better. °ii. If you need a refill on your pain medication,  please contact your pharmacy.  They will contact our office to request authorization. Prescriptions will not be filled after 5 pm or on week-ends. °4. Avoid getting constipated.  Between the surgery and the pain medications, it is common to experience some constipation.  Increasing fluid intake and taking a fiber supplement (such as Metamucil, Citrucel, FiberCon, MiraLax, etc) 1-2 times a day regularly will usually help prevent this problem from occurring.  A mild laxative (prune juice, Milk of Magnesia, MiraLax, etc) should be taken according to package directions if there are no bowel movements after 48 hours.   °5. Watch out for diarrhea.  If you have many loose bowel movements, simplify your diet to bland foods & liquids for a few days.  Stop any stool softeners and decrease your fiber supplement.  Switching to mild anti-diarrheal medications (Kayopectate, Pepto Bismol) can help.  If this worsens or does not improve, please call us. °6. Wash / shower every day.  You may shower over the incision / wound.  Avoid baths until the skin is fully healed.  Continue to shower over incision(s) after the dressing is off. °7. Remove your waterproof bandages 5 days after surgery.  You may leave the incision open to air.  You may replace a dressing/Band-Aid to cover the incision for comfort if you wish. °8. ACTIVITIES as tolerated:   °a. You may resume regular (light) daily activities beginning the next day--such as daily self-care, walking, climbing stairs--gradually increasing activities as tolerated.  If you can   walk 30 minutes without difficulty, it is safe to try more intense activity such as jogging, treadmill, bicycling, low-impact aerobics, swimming, etc. °b. Save the most intensive and strenuous activity for last such as sit-ups, heavy lifting, contact sports, etc  Refrain from any heavy lifting or straining until you are off narcotics for pain control.   °c. DO NOT PUSH THROUGH PAIN.  Let pain be your guide: If it  hurts to do something, don't do it.  Pain is your body warning you to avoid that activity for another week until the pain goes down. °d. You may drive when you are no longer taking prescription pain medication, you can comfortably wear a seatbelt, and you can safely maneuver your car and apply brakes. °e. You may have sexual intercourse when it is comfortable.  °9. FOLLOW UP in our office °a. Please call [facility] at [phone] to set up an appointment to see your surgeon in the office for a follow-up appointment approximately 1-2 weeks after your surgery. °b. Make sure that you call for this appointment the day you arrive home to insure a convenient appointment time. °10. IF YOU HAVE DISABILITY OR FAMILY LEAVE FORMS, BRING THEM TO THE OFFICE FOR PROCESSING.  DO NOT GIVE THEM TO YOUR DOCTOR. ° ° °WHEN TO CALL US [phone]: °1. Poor pain control °2. Reactions / problems with new medications (rash/itching, nausea, etc)  °3. Fever over 101.5 F (38.5 C) °4. Inability to urinate °5. Nausea and/or vomiting °6. Worsening swelling or bruising °7. Continued bleeding from incision. °8. Increased pain, redness, or drainage from the incision ° °The clinic staff is available to answer your questions during regular business hours (8:30am-5pm).  Please don’t hesitate to call and ask to speak to one of our nurses for clinical concerns.   A surgeon from [facility] is always on call at the hospitals °  °If you have a medical emergency, go to the nearest emergency room or call 911. °  ° °[facility] °[address], [address], [place], [place]  [address] ? °MAIN: [phone] ? TOLL FREE: [phone] ? °FAX [phone] °[email or web address] ° ° °Managing Pain ° °Pain after surgery or related to activity is often due to strain/injury to muscle, tendon, nerves and/or incisions.  This pain is usually short-term and will improve in a few months.  ° °Many people find it helpful to do  the following things TOGETHER to help speed the process of healing and to get back to regular activity more quickly: ° °1. Avoid heavy physical activity °a.  no lifting greater than 20 pounds °b. Do not “push through” the pain.  Listen to your body and avoid positions and maneuvers than reproduce the pain °c. Walking is okay as tolerated, but go slowly and stop when getting sore.  °d. Remember: If it hurts to do it, then don’t do it! °2. Take Anti-inflammatory medication  °a. Take with food/snack around the clock for 1-2 weeks °i. This helps the muscle and nerve tissues become less irritable and calm down faster °b. Choose ONE of the following over-the-counter medications: °i. Naproxen 220mg tabs (ex. Aleve) 1-2 pills twice a day  °ii. Ibuprofen 200mg tabs (ex. Advil, Motrin) 3-4 pills with every meal and just before bedtime °iii. Acetaminophen 500mg tabs (Tylenol) 1-2 pills with every meal and just before bedtime °3. Use a Heating pad or Ice/Cold Pack °a. 4-6 times a day °b. May use warm bath/hottub  or showers °4.   Try Gentle Massage and/or Stretching  °a. at the area of pain many times a day °b. stop if you feel pain - do not overdo it ° °Try these steps together to help you body heal faster and avoid making things get worse.  Doing just one of these things may not be enough.   ° °If you are not getting better after two weeks or are noticing you are getting worse, contact our office for further advice; we may need to re-evaluate you & see what other things we can do to help. ° °GETTING TO GOOD BOWEL HEALTH. °Irregular bowel habits such as constipation and diarrhea can lead to many problems over time.  Having one soft bowel movement a day is the most important way to prevent further problems.  The anorectal canal is designed to handle stretching and feces to safely manage our ability to get rid of solid waste (feces, poop, stool) out of our body.  BUT, hard constipated stools can act like ripping concrete bricks  and diarrhea can be a burning fire to this very sensitive area of our body, causing inflamed hemorrhoids, anal fissures, increasing risk is perirectal abscesses, abdominal pain/bloating, an making irritable bowel worse.     °The goal: ONE SOFT BOWEL MOVEMENT A DAY!  To have soft, regular bowel movements:  °  Drink at least 8 tall glasses of water a day.   °  Take plenty of fiber.  Fiber is the undigested part of plant food that passes into the colon, acting s “natures broom” to encourage bowel motility and movement.  Fiber can absorb and hold large amounts of water. This results in a larger, bulkier stool, which is soft and easier to pass. Work gradually over several weeks up to 6 servings a day of fiber (25g a day even more if needed) in the form of: °o Vegetables -- Root (potatoes, carrots, turnips), leafy green (lettuce, salad greens, celery, spinach), or cooked high residue (cabbage, broccoli, etc) °o Fruit -- Fresh (unpeeled skin & pulp), Dried (prunes, apricots, cherries, etc ),  or stewed ( applesauce)  °o Whole grain breads, pasta, etc (whole wheat)  °o Bran cereals  °  Bulking Agents -- This type of water-retaining fiber generally is easily obtained each day by one of the following:  °o Psyllium bran -- The psyllium plant is remarkable because its ground seeds can retain so much water. This product is available as Metamucil, Konsyl, Effersyllium, Per Diem Fiber, or the less expensive generic preparation in drug and health food stores. Although labeled a laxative, it really is not a laxative.  °o Methylcellulose -- This is another fiber derived from wood which also retains water. It is available as Citrucel. °o Polyethylene Glycol - and “artificial” fiber commonly called Miralax or Glycolax.  It is helpful for people with gassy or bloated feelings with regular fiber °o Flax Seed - a less gassy fiber than psyllium °  No reading or other relaxing activity while on the toilet. If bowel movements take longer  than 5 minutes, you are too constipated °  AVOID CONSTIPATION.  High fiber and water intake usually takes care of this.  Sometimes a laxative is needed to stimulate more frequent bowel movements, but  °  Laxatives are not a good long-term solution as it can wear the colon out. °o Osmotics (Milk of Magnesia, Fleets phosphosoda, Magnesium citrate, MiraLax, GoLytely) are safer than  °o Stimulants (Senokot, Castor Oil, Dulcolax, Ex Lax)    °o Do   not take laxatives for more than 7days in a row. °   IF SEVERELY CONSTIPATED, try a Bowel Retraining Program: °o Do not use laxatives.  °o Eat a diet high in roughage, such as bran cereals and leafy vegetables.  °o Drink six (6) ounces of prune or apricot juice each morning.  °o Eat two (2) large servings of stewed fruit each day.  °o Take one (1) heaping tablespoon of a psyllium-based bulking agent twice a day. Use sugar-free sweetener when possible to avoid excessive calories.  °o Eat a normal breakfast.  °o Set aside 15 minutes after breakfast to sit on the toilet, but do not strain to have a bowel movement.  °o If you do not have a bowel movement by the third day, use an enema and repeat the above steps.  °  Controlling diarrhea °o Switch to liquids and simpler foods for a few days to avoid stressing your intestines further. °o Avoid dairy products (especially milk & ice cream) for a short time.  The intestines often can lose the ability to digest lactose when stressed. °o Avoid foods that cause gassiness or bloating.  Typical foods include beans and other legumes, cabbage, broccoli, and dairy foods.  Every person has some sensitivity to other foods, so listen to our body and avoid those foods that trigger problems for you. °o Adding fiber (Citrucel, Metamucil, psyllium, Miralax) gradually can help thicken stools by absorbing excess fluid and retrain the intestines to act more normally.  Slowly increase the dose over a few weeks.  Too much fiber too soon can backfire and  cause cramping & bloating. °o Probiotics (such as active yogurt, Align, etc) may help repopulate the intestines and colon with normal bacteria and calm down a sensitive digestive tract.  Most studies show it to be of mild help, though, and such products can be costly. °o Medicines: °  Bismuth subsalicylate (ex. Kayopectate, Pepto Bismol) every 30 minutes for up to 6 doses can help control diarrhea.  Avoid if pregnant. °  Loperamide (Immodium) can slow down diarrhea.  Start with two tablets (4mg total) first and then try one tablet every 6 hours.  Avoid if you are having fevers or severe pain.  If you are not better or start feeling worse, stop all medicines and call your doctor for advice °o Call your doctor if you are getting worse or not better.  Sometimes further testing (cultures, endoscopy, X-ray studies, bloodwork, etc) may be needed to help diagnose and treat the cause of the diarrhea. °o  °

## 2014-03-05 NOTE — Transfer of Care (Signed)
Immediate Anesthesia Transfer of Care Note  Patient: Mary Cannon  Procedure(s) Performed: Procedure(s): open abdominal wall exploration with removal of mass possible mesh repair  (N/A)  Patient Location: PACU  Anesthesia Type:General  Level of Consciousness: sedated  Airway & Oxygen Therapy: Patient Spontanous Breathing and Patient connected to nasal cannula oxygen  Post-op Assessment: Report given to PACU RN and Post -op Vital signs reviewed and stable  Post vital signs: Reviewed and stable  Complications: No apparent anesthesia complications

## 2014-03-05 NOTE — Anesthesia Postprocedure Evaluation (Signed)
Anesthesia Post Note  Patient: Mary Cannon  Procedure(s) Performed: Procedure(s) (LRB): open abdominal wall exploration with removal of mass possible mesh repair  (N/A)  Anesthesia type: general  Patient location: PACU  Post pain: Pain level controlled  Post assessment: Patient's Cardiovascular Status Stable  Last Vitals:  Filed Vitals:   03/05/14 2115  BP: 110/64  Pulse: 91  Temp: 36.7 C  Resp: 15    Post vital signs: Reviewed and stable  Level of consciousness: sedated  Complications: No apparent anesthesia complications

## 2014-03-07 ENCOUNTER — Encounter (HOSPITAL_COMMUNITY): Payer: Self-pay | Admitting: Surgery

## 2014-03-13 ENCOUNTER — Telehealth (INDEPENDENT_AMBULATORY_CARE_PROVIDER_SITE_OTHER): Payer: Self-pay | Admitting: General Surgery

## 2014-03-13 NOTE — Telephone Encounter (Signed)
Message copied by Flossie Buffy on Wed Mar 13, 2014 12:28 PM ------      Message from: Michael Boston C      Created: Thu Mar 07, 2014  4:49 PM       Pathology shows benign results: Endometrioma              Alisha, CCS MA, please call on the patient to make sure recovery is going well & tell pt the good news on the pathology.  Thanks,            [name], M.D., F.A.C.S.      Gastrointestinal and Minimally Invasive Surgery      [facility]      [address] [place]      [place], [place] [address]      [phone] Main / Paging             ------

## 2014-03-13 NOTE — Telephone Encounter (Signed)
LMOM asking pt to return my call.  This is so that we may inform her that her surgical pathology came back as benign results:  Endometrioma.

## 2014-03-14 NOTE — Telephone Encounter (Signed)
Patient aware of benign path results , Endometrioma .

## 2014-03-14 NOTE — Telephone Encounter (Signed)
Left another message on machine asking patient to return our call so we can deliver her pathology report.

## 2014-03-19 ENCOUNTER — Encounter (INDEPENDENT_AMBULATORY_CARE_PROVIDER_SITE_OTHER): Payer: Self-pay | Admitting: Surgery

## 2014-03-19 ENCOUNTER — Ambulatory Visit (INDEPENDENT_AMBULATORY_CARE_PROVIDER_SITE_OTHER): Payer: BC Managed Care – PPO | Admitting: Surgery

## 2014-03-19 VITALS — BP 128/74 | HR 65 | Temp 98.0°F | Resp 18 | Ht 64.0 in | Wt 165.0 lb

## 2014-03-19 DIAGNOSIS — K5909 Other constipation: Secondary | ICD-10-CM

## 2014-03-19 DIAGNOSIS — K5900 Constipation, unspecified: Secondary | ICD-10-CM

## 2014-03-19 DIAGNOSIS — N806 Endometriosis in cutaneous scar: Secondary | ICD-10-CM

## 2014-03-19 NOTE — Progress Notes (Signed)
Subjective:     Patient ID: Mary Cannon, female   DOB: [birth year], 28 y.o.   MRN: [id number]  HPI  Note: This dictation was prepared with Dragon/digital dictation along with [facility] technology. Any transcriptional errors that result from this process are unintentional.       Mary Cannon  1986/08/06 [id number]  Patient Care Team: Antony Blackbird, MD as PCP - General (Family Medicine) Luz Lex, MD as Consulting Physician (Obstetrics and Gynecology)  Procedure (Date: 03/05/2014):  POST-OPERATIVE DIAGNOSIS: abdominal wall fascia mass ?endometrioma   PROCEDURE:   open abdominal wall exploration with removal of abdominal wall mass  SURGEON:   [name], MD  This patient returns for surgical re-evaluation.  She is feeling well.  When all pain medications.  No fevers or chills.  In good spirits.  Still with constipation.  Trying to increase fiber.  Taking MiraLAX as well.  Overall happy how she is recovered from surgery.  Hoping to do some sailing lessons next month and wondered if that is okay.  Patient Active Problem List   Diagnosis Date Noted  . 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 Constipation, chronic 02/04/2014    Past Medical History  Diagnosis Date  . Endometriosis   . Ovarian cyst   . Abdominal wall mass   . Seasonal allergies     Past Surgical History  Procedure Laterality Date  . Cesarean section    . Umbilical hernia repair N/A 03/05/2014    Procedure: open abdominal wall exploration with removal of mass possible mesh repair ;  Surgeon: [name], MD;  Location: [facility];  Service: General;  Laterality: N/A;    History   Social History  . Marital Status: Married    Spouse Name: N/A    Number of Children: N/A  . Years of Education: N/A   Occupational History  . Not on file.   Social History Main Topics  . Smoking status: Never Smoker   . Smokeless tobacco: Not on file  . Alcohol Use: Yes     Comment:  social  . Drug Use: No  . Sexual Activity: Yes   Other Topics Concern  . Not on file   Social History Narrative  . No narrative on file    No family history on file.  Current Outpatient Prescriptions  Medication Sig Dispense Refill  . cetirizine (ZYRTEC) 10 MG tablet Take 10 mg by mouth daily.      Marland Kitchen HYDROcodone-acetaminophen (NORCO/VICODIN) 5-325 MG per tablet Take 1-2 tablets by mouth every 4 (four) hours as needed for moderate pain or severe pain.  40 tablet  0   No current facility-administered medications for this visit.     Allergies  Allergen Reactions  . Azo [Phenazopyridine] Shortness Of Breath and Nausea And Vomiting    Elevated heart rate    BP 128/74  Pulse 65  Temp(Src) 98 F (36.7 C)  Resp 18  Ht 5\' 4"  (1.626 m)  Wt 165 lb (74.844 kg)  BMI 28.31 kg/m2  LMP 02/18/2014  No results found.   Review of Systems  Constitutional: Negative for fever, chills and diaphoresis.  HENT: Negative for ear pain, sore throat and trouble swallowing.   Eyes: Negative for photophobia and visual disturbance.  Respiratory: Negative for cough and choking.   Cardiovascular: Negative for chest pain and palpitations.  Gastrointestinal: Negative for nausea, vomiting, diarrhea, constipation, abdominal distention, anal bleeding and rectal pain.  Genitourinary: Negative for dysuria,  frequency and difficulty urinating.  Musculoskeletal: Negative for gait problem and myalgias.  Skin: Negative for color change, pallor and rash.  Neurological: Negative for dizziness, speech difficulty, weakness and numbness.  Hematological: Negative for adenopathy.  Psychiatric/Behavioral: Negative for confusion and agitation. The patient is not nervous/anxious.        Objective:   Physical Exam  Constitutional: She is oriented to person, place, and time. She appears well-developed and well-nourished. No distress.  HENT:  Head: Normocephalic.  Mouth/Throat: Oropharynx is clear and moist. No  oropharyngeal exudate.  Eyes: Conjunctivae and EOM are normal. Pupils are equal, round, and reactive to light. No scleral icterus.  Neck: Normal range of motion. No tracheal deviation present.  Cardiovascular: Normal rate and intact distal pulses.   Pulmonary/Chest: Effort normal. No respiratory distress. She exhibits no tenderness.  Abdominal: Soft. She exhibits no distension. There is no tenderness. No hernia. Hernia confirmed negative in the ventral area, confirmed negative in the right inguinal area and confirmed negative in the left inguinal area.    Incisions clean with normal healing ridges.  No hernias  Genitourinary: No vaginal discharge found.  Musculoskeletal: Normal range of motion. She exhibits no tenderness.  Lymphadenopathy:       Right: No inguinal adenopathy present.       Left: No inguinal adenopathy present.  Neurological: She is alert and oriented to person, place, and time. No cranial nerve deficit. She exhibits normal muscle tone. Coordination normal.  Skin: Skin is warm and dry. No rash noted. She is not diaphoretic.  Psychiatric: She has a normal mood and affect. Her behavior is normal.       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  Chronic constipation.     Plan:     I am glad that she is recovering well.   Pathology copy given to patient.  Hopefully that continues.  Increase activity as tolerated to regular activity.  Low impact exercise such as walking an hour a day at least ideal.  Okay to do sailing lessons en bloc next month.  Do not push through pain.  Diet as tolerated.  Low fat high fiber diet ideal.  Bowel regimen with 30 g fiber a day and fiber supplement as needed to avoid problems.  Return to clinic as needed.   Instructions discussed.  Followup with primary care physician for other health issues as would normally be done.  Consider screening for malignancies (breast, prostate, colon,  melanoma, etc) as appropriate.  Questions answered.  The patient expressed understanding and appreciation

## 2014-03-19 NOTE — Patient Instructions (Signed)
GENERAL SURGERY: POST OP INSTRUCTIONS  1. DIET: Follow a light bland diet the first 24 hours after arrival home, such as soup, liquids, crackers, etc.  Be sure to include lots of fluids daily.  Avoid fast food or heavy meals as your are more likely to get nauseated.   2. Take your usually prescribed home medications unless otherwise directed. 3. PAIN CONTROL: a. Pain is best controlled by a usual combination of three different methods TOGETHER: i. Ice/Heat ii. Over the counter pain medication iii. Prescription pain medication b. Most patients will experience some swelling and bruising around the incisions.  Ice packs or heating pads (30-60 minutes up to 6 times a day) will help. Use ice for the first few days to help decrease swelling and bruising, then switch to heat to help relax tight/sore spots and speed recovery.  Some people prefer to use ice alone, heat alone, alternating between ice & heat.  Experiment to what works for you.  Swelling and bruising can take several weeks to resolve.   c. It is helpful to take an over-the-counter pain medication regularly for the first few weeks.  Choose one of the following that works best for you: i. Naproxen (Aleve, etc)  Two 274m tabs twice a day ii. Ibuprofen (Advil, etc) Three 2085mtabs four times a day (every meal & bedtime) iii. Acetaminophen (Tylenol, etc) 500-65021mour times a day (every meal & bedtime) d. A  prescription for pain medication (such as oxycodone, hydrocodone, etc) should be given to you upon discharge.  Take your pain medication as prescribed.  i. If you are having problems/concerns with the prescription medicine (does not control pain, nausea, vomiting, rash, itching, etc), please call us [phone] see if we need to switch you to a different pain medicine that will work better for you and/or control your side effect better. ii. If you need a refill on your pain medication, please contact your pharmacy.  They will contact our  office to request authorization. Prescriptions will not be filled after 5 pm or on week-ends. 4. Avoid getting constipated.  Between the surgery and the pain medications, it is common to experience some constipation.  Increasing fluid intake and taking a fiber supplement (such as Metamucil, Citrucel, FiberCon, MiraLax, etc) 1-2 times a day regularly will usually help prevent this problem from occurring.  A mild laxative (prune juice, Milk of Magnesia, MiraLax, etc) should be taken according to package directions if there are no bowel movements after 48 hours.   5. Wash / shower every day.  You may shower over the dressings as they are waterproof.  Continue to shower over incision(s) after the dressing is off. 6. Remove your waterproof bandages 5 days after surgery.  You may leave the incision open to air.  You may have skin tapes (Steri Strips) covering the incision(s).  Leave them on until one week, then remove.  You may replace a dressing/Band-Aid to cover the incision for comfort if you wish.      7. ACTIVITIES as tolerated:   a. You may resume regular (light) daily activities beginning the next day-such as daily self-care, walking, climbing stairs-gradually increasing activities as tolerated.  If you can walk 30 minutes without difficulty, it is safe to try more intense activity such as jogging, treadmill, bicycling, low-impact aerobics, swimming, etc. b. Save the most intensive and strenuous activity for last such as sit-ups, heavy lifting, contact sports, etc  Refrain from any heavy lifting or straining until you  are off narcotics for pain control.   c. DO NOT PUSH THROUGH PAIN.  Let pain be your guide: If it hurts to do something, don't do it.  Pain is your body warning you to avoid that activity for another week until the pain goes down. d. You may drive when you are no longer taking prescription pain medication, you can comfortably wear a seatbelt, and you can safely maneuver your car and apply  brakes. e. Dennis Bast may have sexual intercourse when it is comfortable.  8. FOLLOW UP in our office a. Please call [facility] at [phone] to set up an appointment to see your surgeon in the office for a follow-up appointment approximately 2-3 weeks after your surgery. b. Make sure that you call for this appointment the day you arrive home to insure a convenient appointment time. 9. IF YOU HAVE DISABILITY OR FAMILY LEAVE FORMS, BRING THEM TO THE OFFICE FOR PROCESSING.  DO NOT GIVE THEM TO YOUR DOCTOR.   WHEN TO CALL us [phone]: 1. Poor pain control 2. Reactions / problems with new medications (rash/itching, nausea, etc)  3. Fever over 101.5 F (38.5 C) 4. Worsening swelling or bruising 5. Continued bleeding from incision. 6. Increased pain, redness, or drainage from the incision 7. Difficulty breathing / swallowing   The clinic staff is available to answer your questions during regular business hours (8:30am-5pm).  Please don't hesitate to call and ask to speak to one of our nurses for clinical concerns.   If you have a medical emergency, go to the nearest emergency room or call 911.  A surgeon from [facility] is always on call at the [facility], [place], [place], [place],   [address] ? MAIN: [phone] ? TOLL FREE: [phone] ?  FAX [phone] [email or web address]  GETTING TO GOOD BOWEL HEALTH. Irregular bowel habits such as constipation and diarrhea can lead to many problems over time.  Having one soft bowel movement a day is the most important way to prevent further problems.  The anorectal canal is designed to handle stretching and feces to safely manage our ability to get rid of solid waste (feces, poop, stool) out of our body.  BUT, hard constipated stools can act like ripping concrete bricks and diarrhea can be a burning fire to this very sensitive area of our body, causing inflamed hemorrhoids,  anal fissures, increasing risk is perirectal abscesses, abdominal pain/bloating, an making irritable bowel worse.     The goal: ONE SOFT BOWEL MOVEMENT A DAY!  To have soft, regular bowel movements:    Drink at least 8 tall glasses of water a day.     Take plenty of fiber.  Fiber is the undigested part of plant food that passes into the colon, acting s "natures broom" to encourage bowel motility and movement.  Fiber can absorb and hold large amounts of water. This results in a larger, bulkier stool, which is soft and easier to pass. Work gradually over several weeks up to 6 servings a day of fiber (25g a day even more if needed) in the form of: o Vegetables -- Root (potatoes, carrots, turnips), leafy green (lettuce, salad greens, celery, spinach), or cooked high residue (cabbage, broccoli, etc) o Fruit -- Fresh (unpeeled skin & pulp), Dried (prunes, apricots, cherries, etc ),  or stewed ( applesauce)  o Whole grain breads, pasta, etc (whole wheat)  o Bran cereals    Bulking Agents -- This  type of water-retaining fiber generally is easily obtained each day by one of the following:  o Psyllium bran -- The psyllium plant is remarkable because its ground seeds can retain so much water. This product is available as Metamucil, Konsyl, Effersyllium, Per Diem Fiber, or the less expensive generic preparation in drug and health food stores. Although labeled a laxative, it really is not a laxative.  o Methylcellulose -- This is another fiber derived from wood which also retains water. It is available as Citrucel. o Polyethylene Glycol - and "artificial" fiber commonly called Miralax or Glycolax.  It is helpful for people with gassy or bloated feelings with regular fiber o Flax Seed - a less gassy fiber than psyllium   No reading or other relaxing activity while on the toilet. If bowel movements take longer than 5 minutes, you are too constipated   AVOID CONSTIPATION.  High fiber and water intake usually takes  care of this.  Sometimes a laxative is needed to stimulate more frequent bowel movements, but    Laxatives are not a good long-term solution as it can wear the colon out. o Osmotics (Milk of Magnesia, Fleets phosphosoda, Magnesium citrate, MiraLax, GoLytely) are safer than  o Stimulants (Senokot, Castor Oil, Dulcolax, Ex Lax)    o Do not take laxatives for more than 7days in a row.    IF SEVERELY CONSTIPATED, try a Bowel Retraining Program: o Do not use laxatives.  o Eat a diet high in roughage, such as bran cereals and leafy vegetables.  o Drink six (6) ounces of prune or apricot juice each morning.  o Eat two (2) large servings of stewed fruit each day.  o Take one (1) heaping tablespoon of a psyllium-based bulking agent twice a day. Use sugar-free sweetener when possible to avoid excessive calories.  o Eat a normal breakfast.  o Set aside 15 minutes after breakfast to sit on the toilet, but do not strain to have a bowel movement.  o If you do not have a bowel movement by the third day, use an enema and repeat the above steps.    Controlling diarrhea o Switch to liquids and simpler foods for a few days to avoid stressing your intestines further. o Avoid dairy products (especially milk & ice cream) for a short time.  The intestines often can lose the ability to digest lactose when stressed. o Avoid foods that cause gassiness or bloating.  Typical foods include beans and other legumes, cabbage, broccoli, and dairy foods.  Every person has some sensitivity to other foods, so listen to our body and avoid those foods that trigger problems for you. o Adding fiber (Citrucel, Metamucil, psyllium, Miralax) gradually can help thicken stools by absorbing excess fluid and retrain the intestines to act more normally.  Slowly increase the dose over a few weeks.  Too much fiber too soon can backfire and cause cramping & bloating. o Probiotics (such as active yogurt, Align, etc) may help repopulate the  intestines and colon with normal bacteria and calm down a sensitive digestive tract.  Most studies show it to be of mild help, though, and such products can be costly. o Medicines:   Bismuth subsalicylate (ex. Kayopectate, Pepto Bismol) every 30 minutes for up to 6 doses can help control diarrhea.  Avoid if pregnant.   Loperamide (Immodium) can slow down diarrhea.  Start with two tablets (68m total) first and then try one tablet every 6 hours.  Avoid if you are having fevers or  severe pain.  If you are not better or start feeling worse, stop all medicines and call your doctor for advice o Call your doctor if you are getting worse or not better.  Sometimes further testing (cultures, endoscopy, X-ray studies, bloodwork, etc) may be needed to help diagnose and treat the cause of the diarrhea.  Exercise to Stay Healthy Exercise helps you become and stay healthy. EXERCISE IDEAS AND TIPS Choose exercises that:  You enjoy.  Fit into your day. You do not need to exercise really hard to be healthy. You can do exercises at a slow or medium level and stay healthy. You can:  Stretch before and after working out.  Try yoga, Pilates, or tai chi.  Lift weights.  Walk fast, swim, jog, run, climb stairs, bicycle, dance, or rollerskate.  Take aerobic classes. Exercises that burn about 150 calories:  Running 1  miles in 15 minutes.  Playing volleyball for 45 to 60 minutes.  Washing and waxing a car for 45 to 60 minutes.  Playing touch football for 45 minutes.  Walking 1  miles in 35 minutes.  Pushing a stroller 1  miles in 30 minutes.  Playing basketball for 30 minutes.  Raking leaves for 30 minutes.  Bicycling 5 miles in 30 minutes.  Walking 2 miles in 30 minutes.  Dancing for 30 minutes.  Shoveling snow for 15 minutes.  Swimming laps for 20 minutes.  Walking up stairs for 15 minutes.  Bicycling 4 miles in 15 minutes.  Gardening for 30 to 45 minutes.  Jumping rope for 15  minutes.  Washing windows or floors for 45 to 60 minutes. Document Released: 10/16/2010 Document Revised: 12/06/2011 Document Reviewed: 10/16/2010 [facility] Patient Information 2015 [place], [place]. This information is not intended to replace advice given to you by your health care provider. Make sure you discuss any questions you have with your health care provider.

## 2014-07-29 ENCOUNTER — Encounter (HOSPITAL_COMMUNITY): Payer: Self-pay | Admitting: Obstetrics and Gynecology

## 2014-09-11 ENCOUNTER — Other Ambulatory Visit: Payer: Self-pay

## 2014-10-10 ENCOUNTER — Encounter (HOSPITAL_BASED_OUTPATIENT_CLINIC_OR_DEPARTMENT_OTHER): Payer: Self-pay | Admitting: Surgery

## 2014-10-24 ENCOUNTER — Other Ambulatory Visit (HOSPITAL_COMMUNITY): Payer: Self-pay | Admitting: Obstetrics and Gynecology

## 2014-10-24 DIAGNOSIS — N809 Endometriosis, unspecified: Secondary | ICD-10-CM

## 2014-10-28 ENCOUNTER — Ambulatory Visit (HOSPITAL_COMMUNITY)
Admission: RE | Admit: 2014-10-28 | Discharge: 2014-10-28 | Disposition: A | Discharge status: Home / Self Care | Payer: BLUE CROSS/BLUE SHIELD | Source: Ambulatory Visit | Attending: Obstetrics and Gynecology | Admitting: Obstetrics and Gynecology

## 2014-10-28 DIAGNOSIS — N803 Endometriosis of pelvic peritoneum: Secondary | ICD-10-CM | POA: Insufficient documentation

## 2014-10-28 DIAGNOSIS — N979 Female infertility, unspecified: Secondary | ICD-10-CM | POA: Diagnosis present

## 2014-10-28 DIAGNOSIS — N809 Endometriosis, unspecified: Secondary | ICD-10-CM

## 2014-10-28 MED ORDER — IOHEXOL 300 MG/ML  SOLN
20.0000 mL | Freq: Once | INTRAMUSCULAR | Status: AC | PRN
  Administered 2014-10-28: 15 mL

## 2014-12-17 LAB — OB RESULTS CONSOLE HEPATITIS B SURFACE ANTIGEN: Hepatitis B Surface Ag: NEGATIVE

## 2014-12-17 LAB — OB RESULTS CONSOLE ABO/RH: RH Type: POSITIVE

## 2014-12-17 LAB — OB RESULTS CONSOLE GC/CHLAMYDIA
Chlamydia: NEGATIVE
GC PROBE AMP, GENITAL: NEGATIVE

## 2014-12-17 LAB — OB RESULTS CONSOLE RUBELLA ANTIBODY, IGM: Rubella: NON-IMMUNE/NOT IMMUNE

## 2014-12-17 LAB — OB RESULTS CONSOLE RPR: RPR: NONREACTIVE

## 2014-12-17 LAB — OB RESULTS CONSOLE ANTIBODY SCREEN: ANTIBODY SCREEN: NEGATIVE

## 2014-12-17 LAB — OB RESULTS CONSOLE HIV ANTIBODY (ROUTINE TESTING): HIV: NONREACTIVE

## 2014-12-19 ENCOUNTER — Encounter (HOSPITAL_COMMUNITY): Payer: Self-pay

## 2014-12-19 ENCOUNTER — Inpatient Hospital Stay (HOSPITAL_COMMUNITY)
Admission: AD | Admit: 2014-12-19 | Discharge: 2014-12-19 | Disposition: A | Discharge status: Home / Self Care | Payer: BLUE CROSS/BLUE SHIELD | Source: Ambulatory Visit | Attending: Obstetrics and Gynecology | Admitting: Obstetrics and Gynecology

## 2014-12-19 DIAGNOSIS — O210 Mild hyperemesis gravidarum: Secondary | ICD-10-CM | POA: Diagnosis not present

## 2014-12-19 DIAGNOSIS — O219 Vomiting of pregnancy, unspecified: Secondary | ICD-10-CM

## 2014-12-19 DIAGNOSIS — R112 Nausea with vomiting, unspecified: Secondary | ICD-10-CM | POA: Diagnosis present

## 2014-12-19 DIAGNOSIS — Z3A08 8 weeks gestation of pregnancy: Secondary | ICD-10-CM | POA: Diagnosis not present

## 2014-12-19 DIAGNOSIS — R1010 Upper abdominal pain, unspecified: Secondary | ICD-10-CM | POA: Insufficient documentation

## 2014-12-19 DIAGNOSIS — O9989 Other specified diseases and conditions complicating pregnancy, childbirth and the puerperium: Secondary | ICD-10-CM | POA: Diagnosis not present

## 2014-12-19 LAB — URINALYSIS, DIPSTICK ONLY
BILIRUBIN URINE: NEGATIVE
GLUCOSE, UA: NEGATIVE mg/dL
Hgb urine dipstick: NEGATIVE
NITRITE: NEGATIVE
PROTEIN: NEGATIVE mg/dL
Specific Gravity, Urine: 1.02 (ref 1.005–1.030)
Urobilinogen, UA: 0.2 mg/dL (ref 0.0–1.0)
pH: 7 (ref 5.0–8.0)

## 2014-12-19 MED ORDER — FAMOTIDINE IN NACL 20-0.9 MG/50ML-% IV SOLN
20.0000 mg | Freq: Once | INTRAVENOUS | Status: AC
  Administered 2014-12-19: 20 mg via INTRAVENOUS
  Filled 2014-12-19: qty 50

## 2014-12-19 MED ORDER — PROMETHAZINE HCL 25 MG PO TABS: 12.5000 mg | ORAL_TABLET | Freq: Four times a day (QID) | ORAL | Status: DC | PRN

## 2014-12-19 MED ORDER — LACTATED RINGERS IV BOLUS (SEPSIS)
1000.0000 mL | Freq: Once | INTRAVENOUS | Status: AC
  Administered 2014-12-19: 1000 mL via INTRAVENOUS

## 2014-12-19 MED ORDER — DEXTROSE 5 % IN LACTATED RINGERS IV BOLUS
1000.0000 mL | Freq: Once | INTRAVENOUS | Status: AC
  Administered 2014-12-19: 1000 mL via INTRAVENOUS

## 2014-12-19 MED ORDER — PROMETHAZINE HCL 25 MG RE SUPP
25.0000 mg | Freq: Once | RECTAL | Status: AC
  Administered 2014-12-19: 25 mg via RECTAL
  Filled 2014-12-19: qty 1

## 2014-12-19 NOTE — MAU Note (Signed)
Pt states here for hyperemesis. Tried eating crackers throughout day with small sips of water, however continues to vomit.

## 2014-12-19 NOTE — MAU Provider Note (Signed)
History     CSN: [id number]  Arrival date and time: 12/19/14 1455   None     No chief complaint on file.  HPI Comments:  [name] is 29 y.o. female G2P0100 at [redacted]w[redacted]d Who presents with vomiting.  The vomiting started on Sunday and has gotten worse throughout the day. She called her Dr. Gabriel Carina today and they recommended she come over for fluids.   She has not vomited since she presented to MAU.   Emesis  This is a new problem. The current episode started in the past 7 days. The problem occurs 2 to 4 times per day. The problem has been gradually worsening. There has been no fever. Associated symptoms include abdominal pain (Occasional upper ). Pertinent negatives include no chills or fever. She has tried nothing for the symptoms.    She denies vaginal bleeding   OB History    No data available      Past Medical History  Diagnosis Date  . Endometriosis   . Ovarian cyst   . Abdominal wall mass   . Seasonal allergies     Past Surgical History  Procedure Laterality Date  . Cesarean section    . Umbilical hernia repair N/A 03/05/2014    Procedure: open abdominal wall exploration with removal of mass possible mesh repair ;  Surgeon: [name], MD;  Location: [facility];  Service: General;  Laterality: N/A;    No family history on file.  History  Substance Use Topics  . Smoking status: Never Smoker   . Smokeless tobacco: Not on file  . Alcohol Use: Yes     Comment: social    Allergies:  Allergies  Allergen Reactions  . Azo [Phenazopyridine] Shortness Of Breath and Nausea And Vomiting    Elevated heart rate    Prescriptions prior to admission  Medication Sig Dispense Refill Last Dose  . cetirizine (ZYRTEC) 10 MG tablet Take 10 mg by mouth daily.   Not Taking  . HYDROcodone-acetaminophen (NORCO/VICODIN) 5-325 MG per tablet Take 1-2 tablets by mouth every 4 (four) hours as needed for moderate pain or severe pain. 40 tablet 0 Not Taking   Results for orders  placed or performed during the hospital encounter of 12/19/14 (from the past 48 hour(s))  Urinalysis, dipstick only     Status: Abnormal   Collection Time: 12/19/14  6:30 PM  Result Value Ref Range   Specific Gravity, Urine 1.020 1.005 - 1.030   pH 7.0 5.0 - 8.0   Glucose, UA NEGATIVE NEGATIVE mg/dL   Hgb urine dipstick NEGATIVE NEGATIVE   Bilirubin Urine NEGATIVE NEGATIVE   Ketones, ur >80 (A) NEGATIVE mg/dL   Protein, ur NEGATIVE NEGATIVE mg/dL   Urobilinogen, UA 0.2 0.0 - 1.0 mg/dL   Nitrite NEGATIVE NEGATIVE   Leukocytes, UA SMALL (A) NEGATIVE    Review of Systems  Constitutional: Negative for fever and chills.  Gastrointestinal: Positive for nausea, vomiting and abdominal pain (Occasional upper ).   Physical Exam   Blood pressure 115/66, pulse 66, temperature 98.5 F (36.9 C), temperature source Oral, resp. rate 16, last menstrual period 10/19/2014, SpO2 100 %.  Physical Exam  Constitutional: She is oriented to person, place, and time. She appears well-developed and well-nourished. No distress.  HENT:  Head: Normocephalic.  Eyes: Pupils are equal, round, and reactive to light.  Neck: Neck supple.  Respiratory: Effort normal.  Musculoskeletal: Normal range of motion.  Neurological: She is alert and oriented to person, place,  and time.  Skin: Skin is warm. She is not diaphoretic.  Psychiatric: Her behavior is normal.    MAU Course  Procedures  None  MDM  Phenergan 25 mg per rectum/ vagina for patient to place  Awaiting a room.   Urine shows > 80 ketones Patient brought back to a room for IV fluid   LR bolus D5LR Pepcid   Discussed patient with Dr. Radene Knee in MAU.   Assessment and Plan   1. Nausea/vomiting in pregnancy    DC home RX phenergan Return to MAU as needed First trimester precautions   Follow-up Information    Follow up with Darlyn Chamber, MD.   Specialty:  Obstetrics and Gynecology   Why:  As scheduled   Contact information:   [address] [place] [address] [phone]         Lezlie Lye, NP 12/19/2014 7:41 PM

## 2014-12-19 NOTE — Discharge Instructions (Signed)
Morning Sickness Morning sickness is when you feel sick to your stomach (nauseous) during pregnancy. This nauseous feeling may or may not come with vomiting. It often occurs in the morning but can be a problem any time of day. Morning sickness is most common during the first trimester, but it may continue throughout pregnancy. While morning sickness is unpleasant, it is usually harmless unless you develop severe and continual vomiting (hyperemesis gravidarum). This condition requires more intense treatment.  CAUSES  The cause of morning sickness is not completely known but seems to be related to normal hormonal changes that occur in pregnancy. RISK FACTORS You are at greater risk if you:  Experienced nausea or vomiting before your pregnancy.  Had morning sickness during a previous pregnancy.  Are pregnant with more than one baby, such as twins. TREATMENT  Do not use any medicines (prescription, over-the-counter, or herbal) for morning sickness without first talking to your health care provider. Your health care provider may prescribe or recommend:  Vitamin B6 supplements.  Anti-nausea medicines.  The herbal medicine ginger. HOME CARE INSTRUCTIONS   Only take over-the-counter or prescription medicines as directed by your health care provider.  Taking multivitamins before getting pregnant can prevent or decrease the severity of morning sickness in most women.  Eat a piece of dry toast or unsalted crackers before getting out of bed in the morning.  Eat five or six small meals a day.  Eat dry and bland foods (rice, baked potato). Foods high in carbohydrates are often helpful.  Do not drink liquids with your meals. Drink liquids between meals.  Avoid greasy, fatty, and spicy foods.  Get someone to cook for you if the smell of any food causes nausea and vomiting.  If you feel nauseous after taking prenatal vitamins, take the vitamins at night or with a snack.  Snack on protein  foods (nuts, yogurt, cheese) between meals if you are hungry.  Eat unsweetened gelatins for desserts.  Wearing an acupressure wristband (worn for sea sickness) may be helpful.  Acupuncture may be helpful.  Do not smoke.  Get a humidifier to keep the air in your house free of odors.  Get plenty of fresh air. SEEK MEDICAL CARE IF:   Your home remedies are not working, and you need medicine.  You feel dizzy or lightheaded.  You are losing weight. SEEK IMMEDIATE MEDICAL CARE IF:   You have persistent and uncontrolled nausea and vomiting.  You pass out (faint). MAKE SURE YOU:  Understand these instructions.  Will watch your condition.  Will get help right away if you are not doing well or get worse. Document Released: 11/04/2006 Document Revised: 09/18/2013 Document Reviewed: 02/28/2013 [facility] Patient Information 2015 [place], [place]. This information is not intended to replace advice given to you by your health care provider. Make sure you discuss any questions you have with your health care provider.  First Trimester of Pregnancy The first trimester of pregnancy is from week 1 until the end of week 12 (months 1 through 3). A week after a sperm fertilizes an egg, the egg will implant on the wall of the uterus. This embryo will begin to develop into a baby. Genes from you and your partner are forming the baby. The female genes determine whether the baby is a boy or a girl. At 6-8 weeks, the eyes and face are formed, and the heartbeat can be seen on ultrasound. At the end of 12 weeks, all the baby's organs are formed.  Now  that you are pregnant, you will want to do everything you can to have a healthy baby. Two of the most important things are to get good prenatal care and to follow your health care provider's instructions. Prenatal care is all the medical care you receive before the baby's birth. This care will help prevent, find, and treat any problems during the pregnancy and  childbirth. BODY CHANGES Your body goes through many changes during pregnancy. The changes vary from woman to woman.   You may gain or lose a couple of pounds at first.  You may feel sick to your stomach (nauseous) and throw up (vomit). If the vomiting is uncontrollable, call your health care provider.  You may tire easily.  You may develop headaches that can be relieved by medicines approved by your health care provider.  You may urinate more often. Painful urination may mean you have a bladder infection.  You may develop heartburn as a result of your pregnancy.  You may develop constipation because certain hormones are causing the muscles that push waste through your intestines to slow down.  You may develop hemorrhoids or swollen, bulging veins (varicose veins).  Your breasts may begin to grow larger and become tender. Your nipples may stick out more, and the tissue that surrounds them (areola) may become darker.  Your gums may bleed and may be sensitive to brushing and flossing.  Dark spots or blotches (chloasma, mask of pregnancy) may develop on your face. This will likely fade after the baby is born.  Your menstrual periods will stop.  You may have a loss of appetite.  You may develop cravings for certain kinds of food.  You may have changes in your emotions from day to day, such as being excited to be pregnant or being concerned that something may go wrong with the pregnancy and baby.  You may have more vivid and strange dreams.  You may have changes in your hair. These can include thickening of your hair, rapid growth, and changes in texture. Some women also have hair loss during or after pregnancy, or hair that feels dry or thin. Your hair will most likely return to normal after your baby is born. WHAT TO EXPECT AT YOUR PRENATAL VISITS During a routine prenatal visit:  You will be weighed to make sure you and the baby are growing normally.  Your blood pressure will  be taken.  Your abdomen will be measured to track your baby's growth.  The fetal heartbeat will be listened to starting around week 10 or 12 of your pregnancy.  Test results from any previous visits will be discussed. Your health care provider may ask you:  How you are feeling.  If you are feeling the baby move.  If you have had any abnormal symptoms, such as leaking fluid, bleeding, severe headaches, or abdominal cramping.  If you have any questions. Other tests that may be performed during your first trimester include:  Blood tests to find your blood type and to check for the presence of any previous infections. They will also be used to check for low iron levels (anemia) and Rh antibodies. Later in the pregnancy, blood tests for diabetes will be done along with other tests if problems develop.  Urine tests to check for infections, diabetes, or protein in the urine.  An ultrasound to confirm the proper growth and development of the baby.  An amniocentesis to check for possible genetic problems.  Fetal screens for spina bifida and Down  syndrome.  You may need other tests to make sure you and the baby are doing well. HOME CARE INSTRUCTIONS  Medicines  Follow your health care provider's instructions regarding medicine use. Specific medicines may be either safe or unsafe to take during pregnancy.  Take your prenatal vitamins as directed.  If you develop constipation, try taking a stool softener if your health care provider approves. Diet  Eat regular, well-balanced meals. Choose a variety of foods, such as meat or vegetable-based protein, fish, milk and low-fat dairy products, vegetables, fruits, and whole grain breads and cereals. Your health care provider will help you determine the amount of weight gain that is right for you.  Avoid raw meat and uncooked cheese. These carry germs that can cause birth defects in the baby.  Eating four or five small meals rather than three  large meals a day may help relieve nausea and vomiting. If you start to feel nauseous, eating a few soda crackers can be helpful. Drinking liquids between meals instead of during meals also seems to help nausea and vomiting.  If you develop constipation, eat more high-fiber foods, such as fresh vegetables or fruit and whole grains. Drink enough fluids to keep your urine clear or pale yellow. Activity and Exercise  Exercise only as directed by your health care provider. Exercising will help you:  Control your weight.  Stay in shape.  Be prepared for labor and delivery.  Experiencing pain or cramping in the lower abdomen or low back is a good sign that you should stop exercising. Check with your health care provider before continuing normal exercises.  Try to avoid standing for long periods of time. Move your legs often if you must stand in one place for a long time.  Avoid heavy lifting.  Wear low-heeled shoes, and practice good posture.  You may continue to have sex unless your health care provider directs you otherwise. Relief of Pain or Discomfort  Wear a good support bra for breast tenderness.   Take warm sitz baths to soothe any pain or discomfort caused by hemorrhoids. Use hemorrhoid cream if your health care provider approves.   Rest with your legs elevated if you have leg cramps or low back pain.  If you develop varicose veins in your legs, wear support hose. Elevate your feet for 15 minutes, 3-4 times a day. Limit salt in your diet. Prenatal Care  Schedule your prenatal visits by the twelfth week of pregnancy. They are usually scheduled monthly at first, then more often in the last 2 months before delivery.  Write down your questions. Take them to your prenatal visits.  Keep all your prenatal visits as directed by your health care provider. Safety  Wear your seat belt at all times when driving.  Make a list of emergency phone numbers, including numbers for family,  friends, the hospital, and police and fire departments. General Tips  Ask your health care provider for a referral to a local prenatal education class. Begin classes no later than at the beginning of month 6 of your pregnancy.  Ask for help if you have counseling or nutritional needs during pregnancy. Your health care provider can offer advice or refer you to specialists for help with various needs.  Do not use hot tubs, steam rooms, or saunas.  Do not douche or use tampons or scented sanitary pads.  Do not cross your legs for long periods of time.  Avoid cat litter boxes and soil used by cats. These carry  germs that can cause birth defects in the baby and possibly loss of the fetus by miscarriage or stillbirth.  Avoid all smoking, herbs, alcohol, and medicines not prescribed by your health care provider. Chemicals in these affect the formation and growth of the baby.  Schedule a dentist appointment. At home, brush your teeth with a soft toothbrush and be gentle when you floss. SEEK MEDICAL CARE IF:   You have dizziness.  You have mild pelvic cramps, pelvic pressure, or nagging pain in the abdominal area.  You have persistent nausea, vomiting, or diarrhea.  You have a bad smelling vaginal discharge.  You have pain with urination.  You notice increased swelling in your face, hands, legs, or ankles. SEEK IMMEDIATE MEDICAL CARE IF:   You have a fever.  You are leaking fluid from your vagina.  You have spotting or bleeding from your vagina.  You have severe abdominal cramping or pain.  You have rapid weight gain or loss.  You vomit blood or material that looks like coffee grounds.  You are exposed to [place] measles and have never had them.  You are exposed to fifth disease or chickenpox.  You develop a severe headache.  You have shortness of breath.  You have any kind of trauma, such as from a fall or a car accident. Document Released: 09/07/2001 Document Revised:  01/28/2014 Document Reviewed: 07/24/2013 [facility] Patient Information 2015 [place], [place]. This information is not intended to replace advice given to you by your health care provider. Make sure you discuss any questions you have with your health care provider. Safe Medications in Pregnancy   Acne: Benzoyl Peroxide Salicylic Acid  Backache/Headache: Tylenol: 2 regular strength every 4 hours OR              2 Extra strength every 6 hours  Colds/Coughs/Allergies: Benadryl (alcohol free) 25 mg every 6 hours as needed Breath right strips Claritin Cepacol throat lozenges Chloraseptic throat spray Cold-Eeze- up to three times per day Cough drops, alcohol free Flonase (by prescription only) Guaifenesin Mucinex Robitussin DM (plain only, alcohol free) Saline nasal spray/drops Sudafed (pseudoephedrine) & Actifed ** use only after [redacted] weeks gestation and if you do not have high blood pressure Tylenol Vicks Vaporub Zinc lozenges Zyrtec   Constipation: Colace Ducolax suppositories Fleet enema Glycerin suppositories Metamucil Milk of magnesia Miralax Senokot Smooth move tea  Diarrhea: Kaopectate Imodium A-D  *NO pepto Bismol  Hemorrhoids: Anusol Anusol HC Preparation H Tucks  Indigestion: Tums Maalox Mylanta Zantac  Pepcid  Insomnia: Benadryl (alcohol free) 25mg  every 6 hours as needed Tylenol PM Unisom, no Gelcaps  Leg Cramps: Tums MagGel  Nausea/Vomiting:  Bonine Dramamine Emetrol Ginger extract Sea bands Meclizine  Nausea medication to take during pregnancy:  Unisom (doxylamine succinate 25 mg tablets) Take one tablet daily at bedtime. If symptoms are not adequately controlled, the dose can be increased to a maximum recommended dose of two tablets daily (1/2 tablet in the morning, 1/2 tablet mid-afternoon and one at bedtime). Vitamin B6 100mg  tablets. Take one tablet twice a day (up to 200 mg per day).  Skin Rashes: Aveeno products Benadryl  cream or 25mg  every 6 hours as needed Calamine Lotion 1% cortisone cream  Yeast infection: Gyne-lotrimin 7 Monistat 7   **If taking multiple medications, please check labels to avoid duplicating the same active ingredients **take medication as directed on the label ** Do not exceed 4000 mg of tylenol in 24 hours **Do not take medications that contain aspirin or ibuprofen

## 2014-12-19 NOTE — MAU Note (Signed)
Pt states she has been nauseous for 6 wks and then she started vomiting Sunday morning and hasn't stopped

## 2014-12-27 IMAGING — CT CT ABD-PELV W/O CM
1 series · 14 of 32 positions shown, 18 images · non-contrast
Comparison: None.

CLINICAL DATA: Left-sided flank pain.

EXAM:
CT ABDOMEN AND PELVIS WITHOUT CONTRAST
TECHNIQUE: Multidetector CT imaging of the abdomen and pelvis was performed
following the standard protocol without IV contrast.

[Series 6: sagittal · sagittal · 0.73mm/px · 14 of 127 slices shown, 18 images]
[im 5/127  lung]
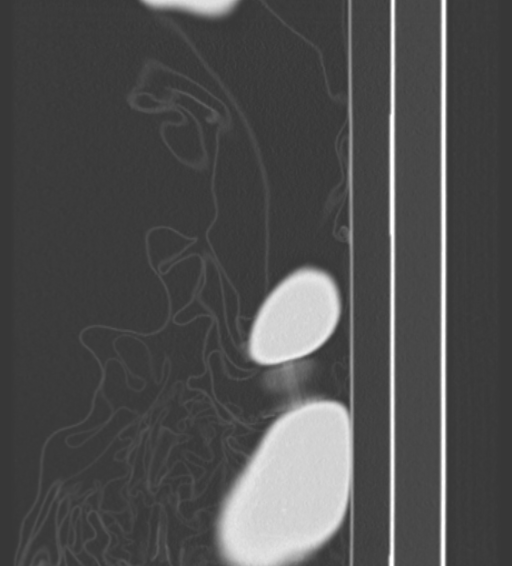
[im 9/127  soft-tissue]
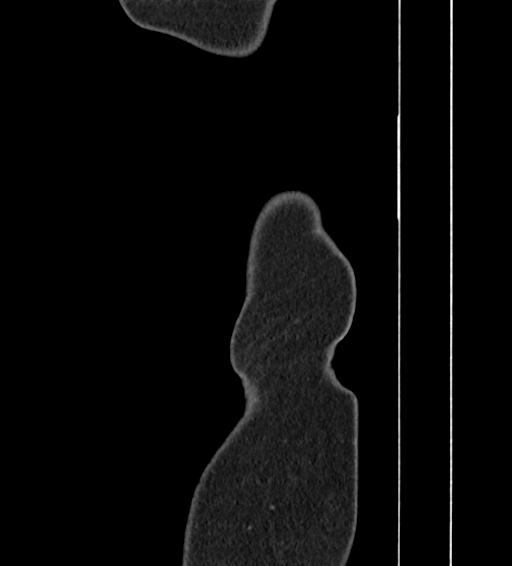
[im 9/127  lung]
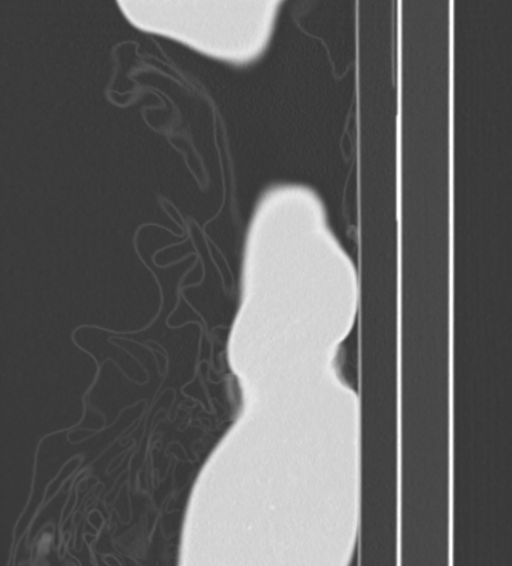
[im 9/127  bone]
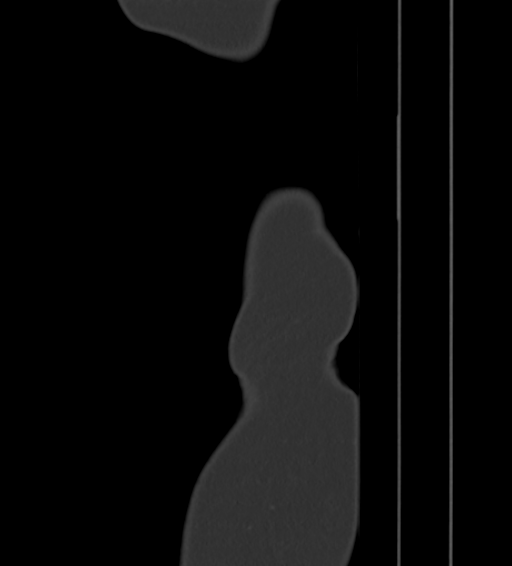
[im 13/127  lung]
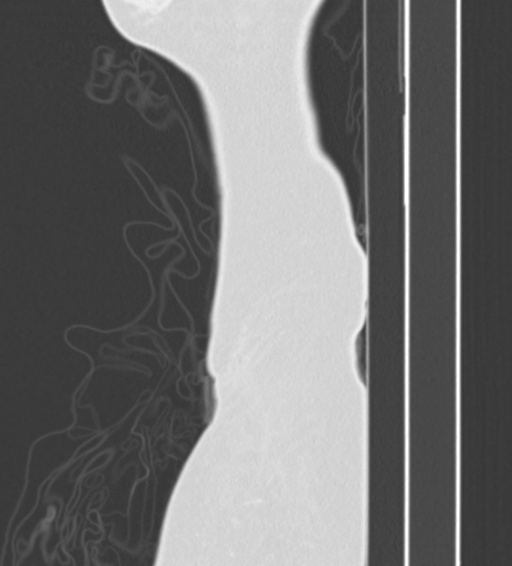
[im 17/127  soft-tissue]
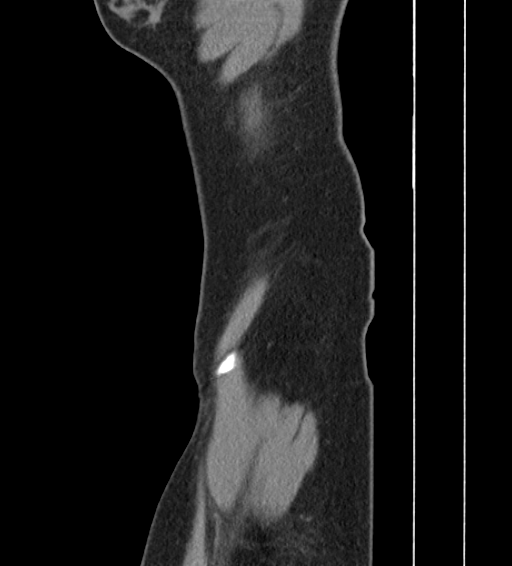
[im 17/127  lung]
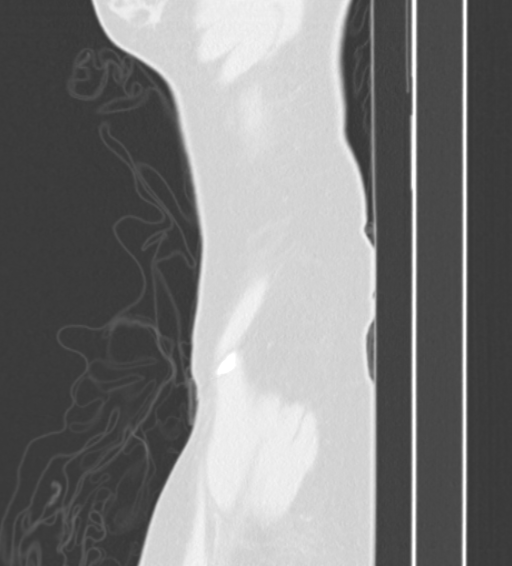
[im 29/127  soft-tissue]
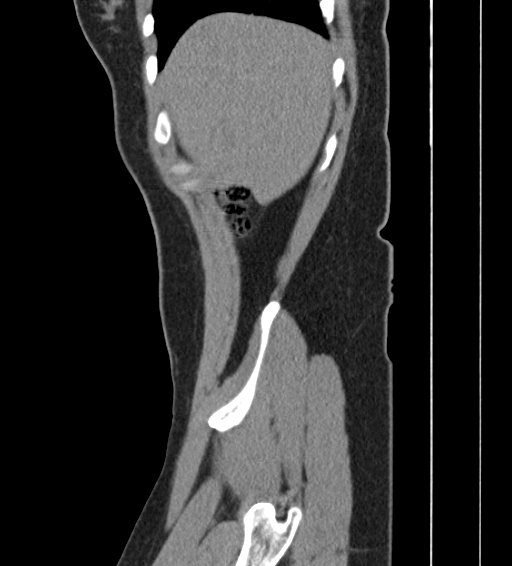
[im 37/127  soft-tissue]
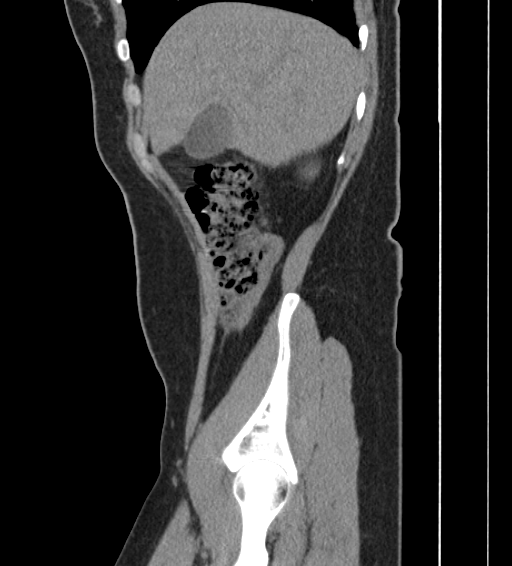
[im 49/127  soft-tissue]
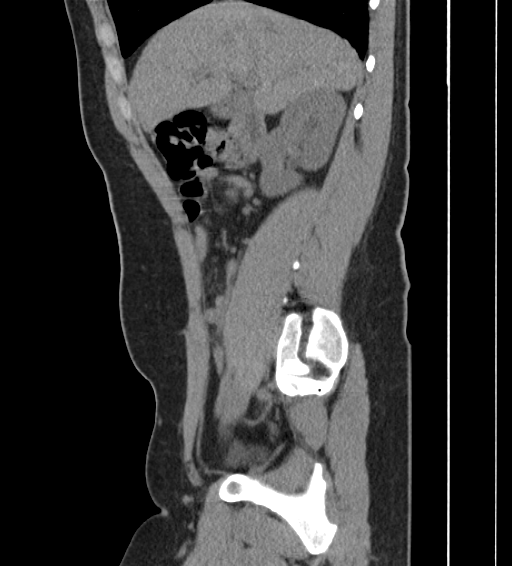
[im 57/127  soft-tissue]
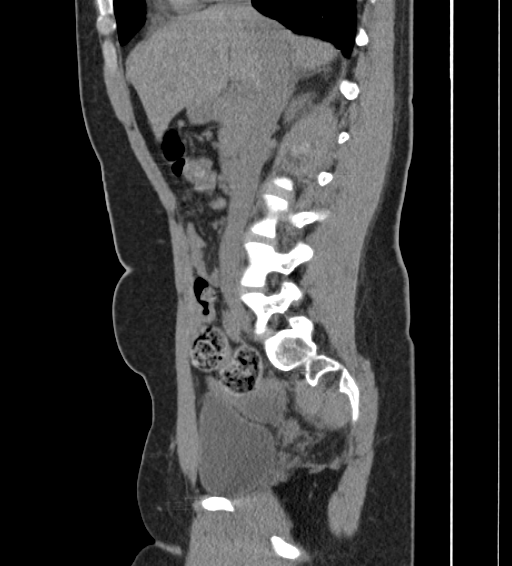
[im 70/127  soft-tissue]
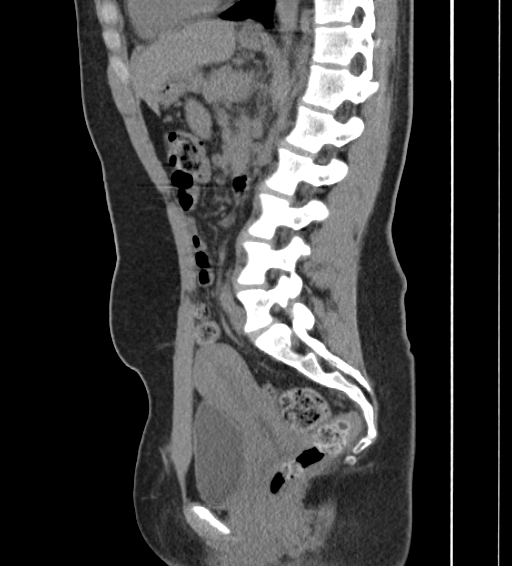
[im 78/127  soft-tissue]
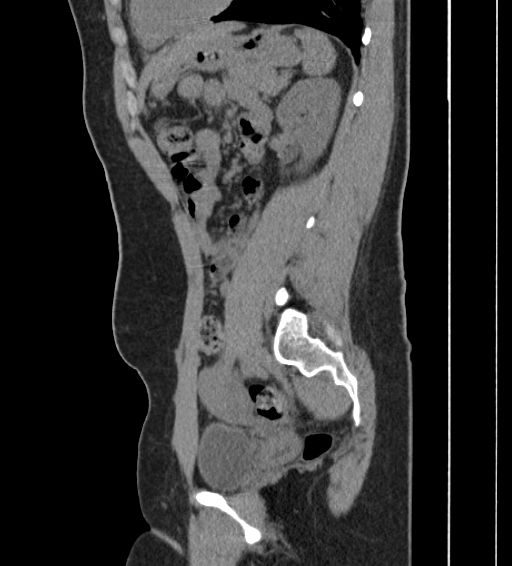
[im 90/127  soft-tissue]
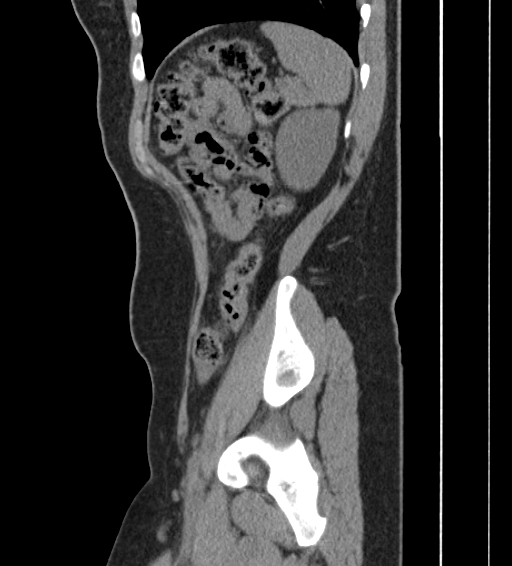
[im 90/127  bone]
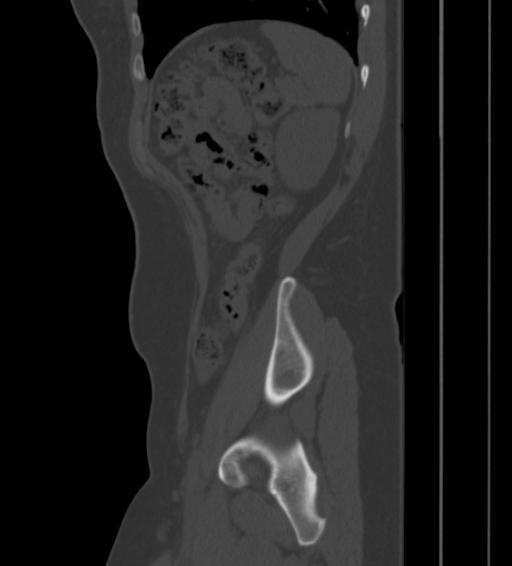
[im 98/127  soft-tissue]
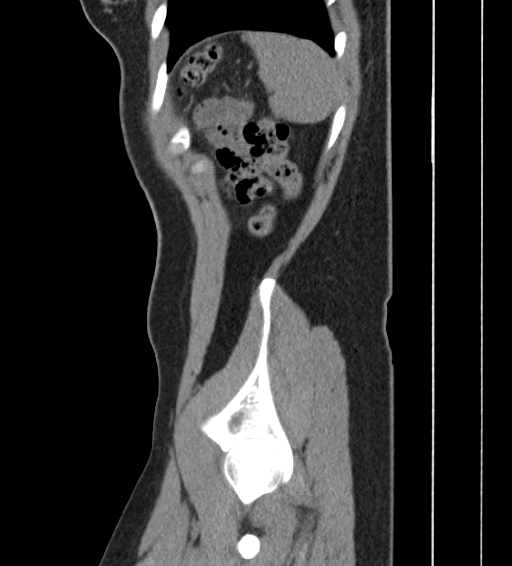
[im 110/127  soft-tissue]
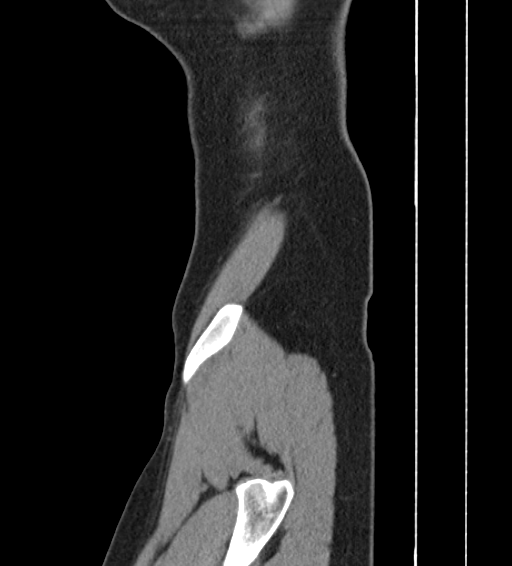
[im 118/127  soft-tissue]
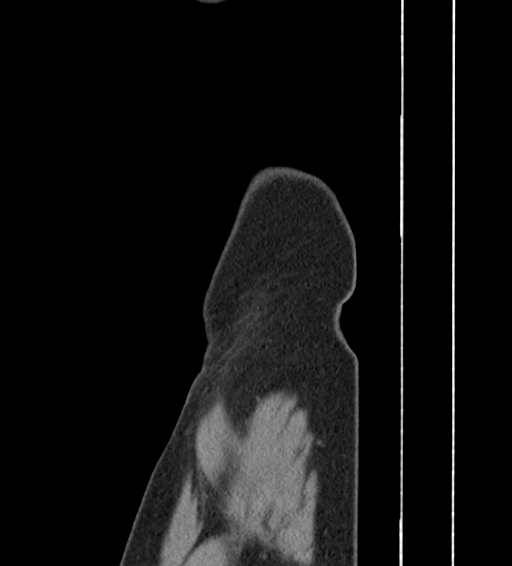

[14 of 32 positions shown; findings below may reference images not displayed]

FINDINGS: There is no evidence of hydronephrosis or renal calculi. The ureters
and bladder are unremarkable. Unenhanced appearance of the liver,
gallbladder, pancreas, spleen, adrenal glands and bowel are
unremarkable. No abnormal fluid collections or masses are
identified. The uterus and adnexal regions are normal in appearance
by CT. No hernias are identified. Bony structures are within normal
limits.
IMPRESSION: Normal CT of the abdomen and pelvis without contrast.

## 2014-12-31 ENCOUNTER — Inpatient Hospital Stay (HOSPITAL_COMMUNITY)
Admission: AD | Admit: 2014-12-31 | Discharge: 2014-12-31 | Disposition: A | Discharge status: Home / Self Care | Payer: BLUE CROSS/BLUE SHIELD | Source: Ambulatory Visit | Attending: Obstetrics and Gynecology | Admitting: Obstetrics and Gynecology

## 2014-12-31 ENCOUNTER — Encounter (HOSPITAL_COMMUNITY): Payer: Self-pay | Admitting: *Deleted

## 2014-12-31 DIAGNOSIS — O219 Vomiting of pregnancy, unspecified: Secondary | ICD-10-CM | POA: Diagnosis not present

## 2014-12-31 DIAGNOSIS — O210 Mild hyperemesis gravidarum: Secondary | ICD-10-CM | POA: Insufficient documentation

## 2014-12-31 DIAGNOSIS — Z3A10 10 weeks gestation of pregnancy: Secondary | ICD-10-CM | POA: Insufficient documentation

## 2014-12-31 LAB — CBC WITH DIFFERENTIAL/PLATELET
Basophils Absolute: 0 10*3/uL (ref 0.0–0.1)
Basophils Relative: 0 % (ref 0–1)
EOS PCT: 0 % (ref 0–5)
Eosinophils Absolute: 0 10*3/uL (ref 0.0–0.7)
HEMATOCRIT: 39.7 % (ref 36.0–46.0)
Hemoglobin: 14.2 g/dL (ref 12.0–15.0)
LYMPHS PCT: 26 % (ref 12–46)
Lymphs Abs: 3.1 10*3/uL (ref 0.7–4.0)
MCH: 30.3 pg (ref 26.0–34.0)
MCHC: 35.8 g/dL (ref 30.0–36.0)
MCV: 84.8 fL (ref 78.0–100.0)
Monocytes Absolute: 0.8 10*3/uL (ref 0.1–1.0)
Monocytes Relative: 7 % (ref 3–12)
NEUTROS ABS: 8 10*3/uL — AB (ref 1.7–7.7)
NEUTROS PCT: 67 % (ref 43–77)
Platelets: 234 10*3/uL (ref 150–400)
RBC: 4.68 MIL/uL (ref 3.87–5.11)
RDW: 12.6 % (ref 11.5–15.5)
WBC: 12 10*3/uL — AB (ref 4.0–10.5)

## 2014-12-31 LAB — COMPREHENSIVE METABOLIC PANEL
ALT: 27 U/L (ref 0–35)
ANION GAP: 9 (ref 5–15)
AST: 19 U/L (ref 0–37)
Albumin: 4.5 g/dL (ref 3.5–5.2)
Alkaline Phosphatase: 58 U/L (ref 39–117)
BUN: 8 mg/dL (ref 6–23)
CALCIUM: 9.8 mg/dL (ref 8.4–10.5)
CHLORIDE: 105 mmol/L (ref 96–112)
CO2: 24 mmol/L (ref 19–32)
CREATININE: 0.54 mg/dL (ref 0.50–1.10)
GFR calc non Af Amer: >90 mL/min (ref >90)
GLUCOSE: 82 mg/dL (ref 70–99)
Potassium: 3.9 mmol/L (ref 3.5–5.1)
Sodium: 138 mmol/L (ref 135–145)
Total Bilirubin: 0.7 mg/dL (ref 0.3–1.2)
Total Protein: 7.3 g/dL (ref 6.0–8.3)

## 2014-12-31 LAB — URINALYSIS, ROUTINE W REFLEX MICROSCOPIC
Bilirubin Urine: NEGATIVE
Glucose, UA: NEGATIVE mg/dL
HGB URINE DIPSTICK: NEGATIVE
Ketones, ur: 40 mg/dL — AB
Nitrite: NEGATIVE
PH: 6.5 (ref 5.0–8.0)
Protein, ur: NEGATIVE mg/dL
SPECIFIC GRAVITY, URINE: 1.02 (ref 1.005–1.030)
Urobilinogen, UA: 0.2 mg/dL (ref 0.0–1.0)

## 2014-12-31 LAB — URINE MICROSCOPIC-ADD ON

## 2014-12-31 MED ORDER — METOCLOPRAMIDE HCL 5 MG/ML IJ SOLN
10.0000 mg | Freq: Once | INTRAMUSCULAR | Status: AC
  Administered 2014-12-31: 10 mg via INTRAVENOUS
  Filled 2014-12-31: qty 2

## 2014-12-31 MED ORDER — METOCLOPRAMIDE HCL 10 MG PO TABS: 10.0000 mg | ORAL_TABLET | Freq: Four times a day (QID) | ORAL | Status: DC

## 2014-12-31 MED ORDER — FAMOTIDINE IN NACL 20-0.9 MG/50ML-% IV SOLN
20.0000 mg | Freq: Once | INTRAVENOUS | Status: AC
  Administered 2014-12-31: 20 mg via INTRAVENOUS
  Filled 2014-12-31: qty 50

## 2014-12-31 MED ORDER — LACTATED RINGERS IV BOLUS (SEPSIS)
1000.0000 mL | Freq: Once | INTRAVENOUS | Status: AC
  Administered 2014-12-31: 1000 mL via INTRAVENOUS

## 2014-12-31 MED ORDER — FAMOTIDINE 20 MG PO TABS: 20.0000 mg | ORAL_TABLET | Freq: Two times a day (BID) | ORAL | Status: DC

## 2014-12-31 NOTE — MAU Note (Signed)
PT SAYS SHE  STARTED  VOMITING   AT 5-6  WEEKS -      THEN CONTINUED   SOMETIME  , THEN   LAST  WEEK   STARTED VOMITING   EVERDAY-  SHE CAME  HERE-  WE GAVE  HER PHENERGAN.Marlana Salvage  DID  NOT  TAKE  ANY  TODAY.   [facility]  [facility].-  SEEN LAST   ON   3-22.      NO BLEEDING.   HAS  SOME  CRAMPING  AND HER  BACK  HURTS.

## 2014-12-31 NOTE — MAU Provider Note (Signed)
History     CSN: [id number]  Arrival date and time: 12/31/14 1810   First Provider Initiated Contact with Patient 12/31/14 2030      No chief complaint on file.  HPI  Ms. Mary Cannon is a 29 y.o. G2P0100 at [redacted]w[redacted]d who presents to MAU today with complaint of N/V. The patient states that this has been throughout the pregnancy but worse x 2 weeks. She also complaint of some abdominal pain with vomiting only that feels like a pulled muscle. She denies vaginal bleeding, fever, diarrhea or sick contacts. She was seen here previously and given Phenergan which she was taking until today. She states that it never helped for very long. She also endorses significant heartburn.   OB History    Gravida Para Term Preterm AB TAB SAB Ectopic Multiple Living   2 1  1       0      Past Medical History  Diagnosis Date  . Endometriosis   . Ovarian cyst   . Abdominal wall mass   . Seasonal allergies     Past Surgical History  Procedure Laterality Date  . Cesarean section    . Umbilical hernia repair N/A 03/05/2014    Procedure: open abdominal wall exploration with removal of mass possible mesh repair ;  Surgeon: [name], MD;  Location: [place];  Service: General;  Laterality: N/A;    History reviewed. No pertinent family history.  History  Substance Use Topics  . Smoking status: Never Smoker   . Smokeless tobacco: Not on file  . Alcohol Use: Yes     Comment: social    Allergies:  Allergies  Allergen Reactions  . Azo [Phenazopyridine] Shortness Of Breath and Nausea And Vomiting    Elevated heart rate    No prescriptions prior to admission    Review of Systems  Constitutional: Negative for fever and malaise/fatigue.  Gastrointestinal: Positive for nausea, vomiting and abdominal pain. Negative for diarrhea and constipation.  Genitourinary: Negative for dysuria, urgency and frequency.       Neg - vaginal bleeding  Neurological: Negative for weakness.   Physical Exam    Blood pressure 115/61, pulse 75, temperature 98.5 F (36.9 C), temperature source Oral, resp. rate 16, height 5\' 2"  (1.575 m), weight 158 lb 6 oz (71.838 kg), last menstrual period 10/19/2014.  Physical Exam  Constitutional: She is oriented to person, place, and time. She appears well-developed and well-nourished. No distress.  HENT:  Head: Normocephalic.  Cardiovascular: Normal rate.   Respiratory: Effort normal.  GI: Soft. Bowel sounds are normal. She exhibits no distension and no mass. There is tenderness (mild tednerness to palpation of the epigastric region and suprapubic region). There is no rebound and no guarding.  Neurological: She is alert and oriented to person, place, and time.  Skin: Skin is warm and dry. No erythema.  Psychiatric: She has a normal mood and affect.   Results for orders placed or performed during the hospital encounter of 12/31/14 (from the past 24 hour(s))  Urinalysis, Routine w reflex microscopic     Status: Abnormal   Collection Time: 12/31/14  7:18 PM  Result Value Ref Range   Color, Urine YELLOW YELLOW   APPearance CLEAR CLEAR   Specific Gravity, Urine 1.020 1.005 - 1.030   pH 6.5 5.0 - 8.0   Glucose, UA NEGATIVE NEGATIVE mg/dL   Hgb urine dipstick NEGATIVE NEGATIVE   Bilirubin Urine NEGATIVE NEGATIVE   Ketones, ur 40 (A) NEGATIVE  mg/dL   Protein, ur NEGATIVE NEGATIVE mg/dL   Urobilinogen, UA 0.2 0.0 - 1.0 mg/dL   Nitrite NEGATIVE NEGATIVE   Leukocytes, UA TRACE (A) NEGATIVE  Urine microscopic-add on     Status: Abnormal   Collection Time: 12/31/14  7:18 PM  Result Value Ref Range   Squamous Epithelial / LPF RARE RARE   WBC, UA 3-6 <3 WBC/hpf   RBC / HPF 3-6 <3 RBC/hpf   Bacteria, UA FEW (A) RARE   Urine-Other AMORPHOUS URATES/PHOSPHATES   CBC with Differential/Platelet     Status: Abnormal   Collection Time: 12/31/14  8:53 PM  Result Value Ref Range   WBC 12.0 (H) 4.0 - 10.5 K/uL   RBC 4.68 3.87 - 5.11 MIL/uL   Hemoglobin 14.2 12.0 -  15.0 g/dL   HCT 39.7 36.0 - 46.0 %   MCV 84.8 78.0 - 100.0 fL   MCH 30.3 26.0 - 34.0 pg   MCHC 35.8 30.0 - 36.0 g/dL   RDW 12.6 11.5 - 15.5 %   Platelets 234 150 - 400 K/uL   Neutrophils Relative % 67 43 - 77 %   Neutro Abs 8.0 (H) 1.7 - 7.7 K/uL   Lymphocytes Relative 26 12 - 46 %   Lymphs Abs 3.1 0.7 - 4.0 K/uL   Monocytes Relative 7 3 - 12 %   Monocytes Absolute 0.8 0.1 - 1.0 K/uL   Eosinophils Relative 0 0 - 5 %   Eosinophils Absolute 0.0 0.0 - 0.7 K/uL   Basophils Relative 0 0 - 1 %   Basophils Absolute 0.0 0.0 - 0.1 K/uL  Comprehensive metabolic panel     Status: None   Collection Time: 12/31/14  8:53 PM  Result Value Ref Range   Sodium 138 135 - 145 mmol/L   Potassium 3.9 3.5 - 5.1 mmol/L   Chloride 105 96 - 112 mmol/L   CO2 24 19 - 32 mmol/L   Glucose, Bld 82 70 - 99 mg/dL   BUN 8 6 - 23 mg/dL   Creatinine, Ser 0.54 0.50 - 1.10 mg/dL   Calcium 9.8 8.4 - 10.5 mg/dL   Total Protein 7.3 6.0 - 8.3 g/dL   Albumin 4.5 3.5 - 5.2 g/dL   AST 19 0 - 37 U/L   ALT 27 0 - 35 U/L   Alkaline Phosphatase 58 39 - 117 U/L   Total Bilirubin 0.7 0.3 - 1.2 mg/dL   GFR calc non Af Amer >90 >90 mL/min   GFR calc Af Amer >90 >90 mL/min   Anion gap 9 5 - 15    MAU Course  Procedures None  MDM UA, CBC, CMP today IV LR with 10 mg Reglan and 20 mg Pepcid given Patient reports significant improvement in symptoms and is able to tolerate PO in MAU without additional emesis Discussed patient with Dr. Corinna Capra. He agrees with plan to discharge home with Rx for Reglan and Pepcid. Follow-up as scheduled.  Assessment and Plan  A: SIUP at [redacted]w[redacted]d Nausea and vomiting in pregnnacy  P: discharge home Rx for Reglan and Pepcid given to patient AVS contains information about diet for N/V in pregnancy Patient encouraged to follow-up in the office as scheduled for routine prenatal care Patient may return to MAU as needed or if her condition were to change or worsen   Luvenia Redden, PA-C  12/31/2014,  11:38 PM

## 2014-12-31 NOTE — Discharge Instructions (Signed)
Eating Plan for Hyperemesis Gravidarum Severe cases of hyperemesis gravidarum can lead to dehydration and malnutrition. The hyperemesis eating plan is one way to lessen the symptoms of nausea and vomiting. It is often used with prescribed medicines to control your symptoms.  WHAT CAN I DO TO RELIEVE MY SYMPTOMS? Listen to your body. Everyone is different and has different preferences. Find what works best for you. Some of the following things may help:  Eat and drink slowly.  Eat 5-6 small meals daily instead of 3 large meals.   Eat crackers before you get out of bed in the morning.   Starchy foods are usually well tolerated (such as cereal, toast, bread, potatoes, pasta, rice, and pretzels).   Ginger may help with nausea. Add  tsp ground ginger to hot tea or choose ginger tea.   Try drinking 100% fruit juice or an electrolyte drink.  Continue to take your prenatal vitamins as directed by your health care provider. If you are having trouble taking your prenatal vitamins, talk with your health care provider about different options.  Include at least 1 serving of protein with your meals and snacks (such as meats or poultry, beans, nuts, eggs, or yogurt). Try eating a protein-rich snack before bed (such as cheese and crackers or a half [place] or peanut butter sandwich). WHAT THINGS SHOULD I AVOID TO REDUCE MY SYMPTOMS? The following things may help reduce your symptoms:  Avoid foods with strong smells. Try eating meals in well-ventilated areas that are free of odors.  Avoid drinking water or other beverages with meals. Try not to drink anything less than 30 minutes before and after meals.  Avoid drinking more than 1 cup of fluid at a time.  Avoid fried or high-fat foods, such as butter and cream sauces.  Avoid spicy foods.  Avoid skipping meals the best you can. Nausea can be more intense on an empty stomach. If you cannot tolerate food at that time, do not force it. Try sucking on  ice chips or other frozen items and make up the calories later.  Avoid lying down within 2 hours after eating. Document Released: 07/11/2007 Document Revised: 09/18/2013 Document Reviewed: 07/18/2013 [facility] Patient Information 2015 [place], [place]. This information is not intended to replace advice given to you by your health care provider. Make sure you discuss any questions you have with your health care provider.  Morning Sickness Morning sickness is when you feel sick to your stomach (nauseous) during pregnancy. You may feel sick to your stomach and throw up (vomit). You may feel sick in the morning, but you can feel this way any time of day. Some women feel very sick to their stomach and cannot stop throwing up (hyperemesis gravidarum). HOME CARE  Only take medicines as told by your doctor.  Take multivitamins as told by your doctor. Taking multivitamins before getting pregnant can stop or lessen the harshness of morning sickness.  Eat dry toast or unsalted crackers before getting out of bed.  Eat 5 to 6 small meals a day.  Eat dry and bland foods like rice and baked potatoes.  Do not drink liquids with meals. Drink between meals.  Do not eat greasy, fatty, or spicy foods.  Have someone cook for you if the smell of food causes you to feel sick or throw up.  If you feel sick to your stomach after taking prenatal vitamins, take them at night or with a snack.  Eat protein when you need a snack (nuts,  yogurt, cheese).  Eat unsweetened gelatins for dessert.  Wear a bracelet used for sea sickness (acupressure wristband).  Go to a doctor that puts thin needles into certain body points (acupuncture) to improve how you feel.  Do not smoke.  Use a humidifier to keep the air in your house free of odors.  Get lots of fresh air. GET HELP IF:  You need medicine to feel better.  You feel dizzy or lightheaded.  You are losing weight. GET HELP RIGHT AWAY IF:   You feel very  sick to your stomach and cannot stop throwing up.  You pass out (faint). MAKE SURE YOU:  Understand these instructions.  Will watch your condition.  Will get help right away if you are not doing well or get worse. Document Released: 10/21/2004 Document Revised: 09/18/2013 Document Reviewed: 02/28/2013 [facility] Patient Information 2015 [place], [place]. This information is not intended to replace advice given to you by your health care provider. Make sure you discuss any questions you have with your health care provider.

## 2015-01-02 ENCOUNTER — Other Ambulatory Visit: Payer: Self-pay | Admitting: Obstetrics and Gynecology

## 2015-01-03 LAB — CYTOLOGY - PAP

## 2015-01-10 ENCOUNTER — Other Ambulatory Visit: Payer: Self-pay | Admitting: Family Medicine

## 2015-02-23 ENCOUNTER — Encounter (HOSPITAL_COMMUNITY): Payer: Self-pay

## 2015-02-23 ENCOUNTER — Inpatient Hospital Stay (HOSPITAL_COMMUNITY): Payer: BLUE CROSS/BLUE SHIELD

## 2015-02-23 ENCOUNTER — Inpatient Hospital Stay (HOSPITAL_COMMUNITY)
Admission: AD | Admit: 2015-02-23 | Discharge: 2015-02-23 | Disposition: A | Discharge status: Home / Self Care | Payer: BLUE CROSS/BLUE SHIELD | Source: Ambulatory Visit | Attending: Obstetrics and Gynecology | Admitting: Obstetrics and Gynecology

## 2015-02-23 DIAGNOSIS — R109 Unspecified abdominal pain: Secondary | ICD-10-CM | POA: Diagnosis present

## 2015-02-23 DIAGNOSIS — O26899 Other specified pregnancy related conditions, unspecified trimester: Secondary | ICD-10-CM

## 2015-02-23 DIAGNOSIS — Z3A18 18 weeks gestation of pregnancy: Secondary | ICD-10-CM | POA: Diagnosis not present

## 2015-02-23 DIAGNOSIS — O4702 False labor before 37 completed weeks of gestation, second trimester: Secondary | ICD-10-CM

## 2015-02-23 DIAGNOSIS — O9989 Other specified diseases and conditions complicating pregnancy, childbirth and the puerperium: Secondary | ICD-10-CM | POA: Insufficient documentation

## 2015-02-23 LAB — URINALYSIS, ROUTINE W REFLEX MICROSCOPIC
BILIRUBIN URINE: NEGATIVE
Glucose, UA: NEGATIVE mg/dL
HGB URINE DIPSTICK: NEGATIVE
Ketones, ur: NEGATIVE mg/dL
Nitrite: NEGATIVE
Protein, ur: NEGATIVE mg/dL
Specific Gravity, Urine: <1.005 — ABNORMAL LOW (ref 1.005–1.030)
UROBILINOGEN UA: 0.2 mg/dL (ref 0.0–1.0)
pH: 6.5 (ref 5.0–8.0)

## 2015-02-23 LAB — URINE MICROSCOPIC-ADD ON

## 2015-02-23 NOTE — MAU Provider Note (Signed)
History     CSN: [id number]  Arrival date and time: 02/23/15 1610   First Provider Initiated Contact with Patient 02/23/15 1635      Chief Complaint  Patient presents with  . Abdominal Pain   HPI Mary Cannon 29 y.o. G2P0100 @[redacted]w[redacted]d  presents to MAU with pelvic pain and back pain ongoing x a few days.  2 hours ago, the pain got worse.  She rested and did not notice improvement.  Last IC yesterday.  Denies VB, LOF, discharge, dysuria, nausea, vomiting.  She has been eating and drinking well.     She was noted to have subchorionic hemorrhage earlier in pregnancy but this has improved.  She has been more active overall the last month.   OB History    Gravida Para Term Preterm AB TAB SAB Ectopic Multiple Living   2 1  1  0     0      Past Medical History  Diagnosis Date  . Endometriosis   . Ovarian cyst   . Abdominal wall mass   . Seasonal allergies     Past Surgical History  Procedure Laterality Date  . Cesarean section    . Umbilical hernia repair N/A 03/05/2014    Procedure: open abdominal wall exploration with removal of mass possible mesh repair ;  Surgeon: [name], MD;  Location: [place];  Service: General;  Laterality: N/A;    History reviewed. No pertinent family history.  History  Substance Use Topics  . Smoking status: Never Smoker   . Smokeless tobacco: Not on file  . Alcohol Use: Yes     Comment: social    Allergies:  Allergies  Allergen Reactions  . Azo [Phenazopyridine] Shortness Of Breath and Nausea And Vomiting    Elevated heart rate    Prescriptions prior to admission  Medication Sig Dispense Refill Last Dose  . famotidine (PEPCID) 20 MG tablet Take 1 tablet (20 mg total) by mouth 2 (two) times daily. 60 tablet 0   . metoCLOPramide (REGLAN) 10 MG tablet Take 1 tablet (10 mg total) by mouth every 6 (six) hours. 30 tablet 0   . Prenatal Vit-Fe Fumarate-FA (PRENATAL MULTIVITAMIN) TABS tablet Take 1 tablet by mouth daily at 12 noon.   Past  Month at Unknown time  . progesterone (PROMETRIUM) 200 MG capsule Take 200 mg by mouth 2 (two) times daily.   Past Week at Unknown time  . promethazine (PHENERGAN) 25 MG tablet Take 0.5-1 tablets (12.5-25 mg total) by mouth every 6 (six) hours as needed. 30 tablet 0 12/30/2014 at Unknown time  . Sodium Bicarbonate-Citric Acid (ALKA-SELTZER HEARTBURN PO) Take 1 each by mouth 2 (two) times daily as needed (For heartburn.).   12/31/2014 at Unknown time    ROS Pertinent ROS in HPI.  All other systems are negative.   Physical Exam   Blood pressure 107/67, pulse 89, temperature 98 F (36.7 C), temperature source Oral, resp. rate 18, last menstrual period 10/19/2014.  Physical Exam  Constitutional: She is oriented to person, place, and time. She appears well-developed and well-nourished. No distress.  HENT:  Head: Normocephalic and atraumatic.  Eyes: EOM are normal.  Neck: Normal range of motion.  Cardiovascular: Normal rate and regular rhythm.   Respiratory: Breath sounds normal. No respiratory distress.  GI: Soft. Bowel sounds are normal. She exhibits no distension.  Musculoskeletal: Normal range of motion.  Neurological: She is alert and oriented to person, place, and time.  Skin: Skin is warm  and dry.  Psychiatric: She has a normal mood and affect.   Results for orders placed or performed during the hospital encounter of 02/23/15 (from the past 24 hour(s))  Urinalysis, Routine w reflex microscopic (not at [facility])     Status: Abnormal   Collection Time: 02/23/15  4:15 PM  Result Value Ref Range   Color, Urine YELLOW YELLOW   APPearance CLEAR CLEAR   Specific Gravity, Urine <1.005 (L) 1.005 - 1.030   pH 6.5 5.0 - 8.0   Glucose, UA NEGATIVE NEGATIVE mg/dL   Hgb urine dipstick NEGATIVE NEGATIVE   Bilirubin Urine NEGATIVE NEGATIVE   Ketones, ur NEGATIVE NEGATIVE mg/dL   Protein, ur NEGATIVE NEGATIVE mg/dL   Urobilinogen, UA 0.2 0.0 - 1.0 mg/dL   Nitrite NEGATIVE NEGATIVE   Leukocytes, UA  TRACE (A) NEGATIVE  Urine microscopic-add on     Status: Abnormal   Collection Time: 02/23/15  4:15 PM  Result Value Ref Range   Squamous Epithelial / LPF RARE RARE   WBC, UA 0-2 <3 WBC/hpf   Bacteria, UA FEW (A) RARE   Urine-Other MUCOUS PRESENT     MAU Course  Procedures  MDM Discussed with Dr. Matthew Saras.  He advises for u/s to eval cervical length/placenta.  He is agreeable to discharge to home with normal U/S and U/A.  He advises for laxative for constipation.    U/S is negative with no previa and appropriate cervical length.  U/A negative.    Assessment and Plan  A:  1. Abdominal pain affecting pregnancy    P: Discharge to home Safe OTC meds for constipation info given.   Pt to f/u in clinic as scheduled/as needed Patient may return to MAU as needed or if her condition were to change or worsen    [name] 02/23/2015, 4:36 PM

## 2015-02-23 NOTE — Discharge Instructions (Signed)
Abdominal Pain During Pregnancy Abdominal pain is common in pregnancy. Most of the time, it does not cause harm. There are many causes of abdominal pain. Some causes are more serious than others. Some of the causes of abdominal pain in pregnancy are easily diagnosed. Occasionally, the diagnosis takes time to understand. Other times, the cause is not determined. Abdominal pain can be a sign that something is very wrong with the pregnancy, or the pain may have nothing to do with the pregnancy at all. For this reason, always tell your health care provider if you have any abdominal discomfort. HOME CARE INSTRUCTIONS  Monitor your abdominal pain for any changes. The following actions may help to alleviate any discomfort you are experiencing:  Do not have sexual intercourse or put anything in your vagina until your symptoms go away completely.  Get plenty of rest until your pain improves.  Drink clear fluids if you feel nauseous. Avoid solid food as long as you are uncomfortable or nauseous.  Only take over-the-counter or prescription medicine as directed by your health care provider.  Keep all follow-up appointments with your health care provider. SEEK IMMEDIATE MEDICAL CARE IF:  You are bleeding, leaking fluid, or passing tissue from the vagina.  You have increasing pain or cramping.  You have persistent vomiting.  You have painful or bloody urination.  You have a fever.  You notice a decrease in your baby's movements.  You have extreme weakness or feel faint.  You have shortness of breath, with or without abdominal pain.  You develop a severe headache with abdominal pain.  You have abnormal vaginal discharge with abdominal pain.  You have persistent diarrhea.  You have abdominal pain that continues even after rest, or gets worse. MAKE SURE YOU:   Understand these instructions.  Will watch your condition.  Will get help right away if you are not doing well or get  worse. Document Released: 09/13/2005 Document Revised: 07/04/2013 Document Reviewed: 04/12/2013 [facility] Patient Information 2015 [place], [place]. This information is not intended to replace advice given to you by your health care provider. Make sure you discuss any questions you have with your health care provider. Preterm Labor Information Preterm labor is when labor starts at less than 37 weeks of pregnancy. The normal length of a pregnancy is 39 to 41 weeks. CAUSES Often, there is no identifiable underlying cause as to why a woman goes into preterm labor. One of the most common known causes of preterm labor is infection. Infections of the uterus, cervix, vagina, amniotic sac, bladder, kidney, or even the lungs (pneumonia) can cause labor to start. Other suspected causes of preterm labor include:   Urogenital infections, such as yeast infections and bacterial vaginosis.   Uterine abnormalities (uterine shape, uterine septum, fibroids, or bleeding from the placenta).   A cervix that has been operated on (it may fail to stay closed).   Malformations in the fetus.   Multiple gestations (twins, triplets, and so on).   Breakage of the amniotic sac.  RISK FACTORS  Having a previous history of preterm labor.   Having premature rupture of membranes (PROM).   Having a placenta that covers the opening of the cervix (placenta previa).   Having a placenta that separates from the uterus (placental abruption).   Having a cervix that is too weak to hold the fetus in the uterus (incompetent cervix).   Having too much fluid in the amniotic sac (polyhydramnios).   Taking illegal drugs or smoking while pregnant.  Not gaining enough weight while pregnant.   Being younger than 48 and older than 27 years old.   Having a low socioeconomic status.   Being African American. SYMPTOMS Signs and symptoms of preterm labor include:   Menstrual-like cramps, abdominal pain, or back  pain.  Uterine contractions that are regular, as frequent as six in an hour, regardless of their intensity (may be mild or painful).  Contractions that start on the top of the uterus and spread down to the lower abdomen and back.   A sense of increased pelvic pressure.   A watery or bloody mucus discharge that comes from the vagina.  TREATMENT Depending on the length of the pregnancy and other circumstances, your health care provider may suggest bed rest. If necessary, there are medicines that can be given to stop contractions and to mature the fetal lungs. If labor happens before 34 weeks of pregnancy, a prolonged hospital stay may be recommended. Treatment depends on the condition of both you and the fetus.  WHAT SHOULD YOU DO IF YOU THINK YOU ARE IN PRETERM LABOR? Call your health care provider right away. You will need to go to the hospital to get checked immediately. HOW CAN YOU PREVENT PRETERM LABOR IN FUTURE PREGNANCIES? You should:   Stop smoking if you smoke.  Maintain healthy weight gain and avoid chemicals and drugs that are not necessary.  Be watchful for any type of infection.  Inform your health care provider if you have a known history of preterm labor. Document Released: 12/04/2003 Document Revised: 05/16/2013 Document Reviewed: 10/16/2012 [facility] Patient Information 2015 [place], [place]. This information is not intended to replace advice given to you by your health care provider. Make sure you discuss any questions you have with your health care provider.  Safe Over the Counter Medications in Pregnancy   Constipation:  Colace  Ducolax suppositories  Fleet enema  Glycerin suppositories  Metamucil  Milk of magnesia  Miralax  Senokot  Smooth move tea    **If taking multiple medications, please check labels to avoid duplicating the same active ingredients  **take medication as directed on the label  ** Do not exceed 4000 mg of tylenol in 24 hours  **Do not  take medications that contain aspirin or ibuprofen

## 2015-02-23 NOTE — MAU Note (Signed)
Pt presents complaining of left lower abdominal pain that is constant but sometimes gets worse. Pt also complaining of contractions every 5 minutes while she was at home but just feels like she is having pressure now. Denies vaginal bleeding or abnormal discharge.

## 2015-04-09 ENCOUNTER — Encounter (HOSPITAL_COMMUNITY): Payer: Self-pay

## 2015-04-09 ENCOUNTER — Inpatient Hospital Stay (HOSPITAL_COMMUNITY)
Admission: AD | Admit: 2015-04-09 | Discharge: 2015-04-10 | Disposition: A | Discharge status: Home / Self Care | Payer: BLUE CROSS/BLUE SHIELD | Source: Ambulatory Visit | Attending: Obstetrics and Gynecology | Admitting: Obstetrics and Gynecology

## 2015-04-09 DIAGNOSIS — O9989 Other specified diseases and conditions complicating pregnancy, childbirth and the puerperium: Secondary | ICD-10-CM | POA: Diagnosis not present

## 2015-04-09 DIAGNOSIS — R102 Pelvic and perineal pain: Secondary | ICD-10-CM | POA: Diagnosis not present

## 2015-04-09 DIAGNOSIS — O0932 Supervision of pregnancy with insufficient antenatal care, second trimester: Secondary | ICD-10-CM | POA: Insufficient documentation

## 2015-04-09 DIAGNOSIS — R109 Unspecified abdominal pain: Secondary | ICD-10-CM | POA: Diagnosis not present

## 2015-04-09 DIAGNOSIS — Z3A24 24 weeks gestation of pregnancy: Secondary | ICD-10-CM | POA: Insufficient documentation

## 2015-04-09 DIAGNOSIS — M549 Dorsalgia, unspecified: Secondary | ICD-10-CM | POA: Diagnosis not present

## 2015-04-09 DIAGNOSIS — N949 Unspecified condition associated with female genital organs and menstrual cycle: Secondary | ICD-10-CM

## 2015-04-09 DIAGNOSIS — M545 Low back pain: Secondary | ICD-10-CM | POA: Diagnosis present

## 2015-04-09 DIAGNOSIS — O26899 Other specified pregnancy related conditions, unspecified trimester: Secondary | ICD-10-CM | POA: Insufficient documentation

## 2015-04-09 DIAGNOSIS — O99891 Other specified diseases and conditions complicating pregnancy: Secondary | ICD-10-CM | POA: Insufficient documentation

## 2015-04-09 DIAGNOSIS — Z3689 Encounter for other specified antenatal screening: Secondary | ICD-10-CM | POA: Insufficient documentation

## 2015-04-09 DIAGNOSIS — Z3A25 25 weeks gestation of pregnancy: Secondary | ICD-10-CM | POA: Diagnosis not present

## 2015-04-09 LAB — URINALYSIS, ROUTINE W REFLEX MICROSCOPIC
Bilirubin Urine: NEGATIVE
Glucose, UA: NEGATIVE mg/dL
HGB URINE DIPSTICK: NEGATIVE
KETONES UR: NEGATIVE mg/dL
LEUKOCYTES UA: NEGATIVE
Nitrite: NEGATIVE
PROTEIN: NEGATIVE mg/dL
Specific Gravity, Urine: 1.015 (ref 1.005–1.030)
Urobilinogen, UA: 0.2 mg/dL (ref 0.0–1.0)
pH: 6.5 (ref 5.0–8.0)

## 2015-04-09 LAB — WET PREP, GENITAL
Clue Cells Wet Prep HPF POC: NONE SEEN
Trich, Wet Prep: NONE SEEN
Yeast Wet Prep HPF POC: NONE SEEN

## 2015-04-09 NOTE — MAU Note (Signed)
Cramps in abd and lower back pain yesterday. Hard to urinate this morning, like had to relax to make it come out.  No burning with urination.  Felt nausea more in last few days. Baby moving well. No leaking. No bleeding.

## 2015-04-09 NOTE — MAU Provider Note (Signed)
History     CSN: [id number]  Arrival date and time: 04/09/15 2209   First Provider Initiated Contact with Patient 04/09/15 2248      Chief Complaint  Patient presents with  . Abdominal Cramping  . Back Pain   HPI Comments: Mary Cannon is a 29 y.o. G2P0100 at [redacted]w[redacted]d who presents today with lower back pain, and abdominal cramping. She states that this pain started yesterday. She denies any vaginal bleeding or LOF. She states that the baby has been moving normally. She states that this morning she had to "push hard to empty her bladder". She states that her next appointment in the office is on 04/23/15. She states that at her last [place] the placenta was 1.3cm from the os. No other pregnancy complications.   Abdominal Cramping This is a new problem. The current episode started yesterday. The onset quality is gradual. The problem occurs intermittently. The problem has been unchanged. The pain is located in the suprapubic region. The pain is at a severity of 4/10. The quality of the pain is cramping (similar to period cramps ). The abdominal pain radiates to the back. Associated symptoms include nausea. Pertinent negatives include no constipation, diarrhea, dysuria, fever, frequency or vomiting. The pain is aggravated by certain positions. The pain is relieved by nothing. She has tried nothing for the symptoms.     Past Medical History  Diagnosis Date  . Endometriosis   . Ovarian cyst   . Abdominal wall mass   . Seasonal allergies     Past Surgical History  Procedure Laterality Date  . Cesarean section    . Umbilical hernia repair N/A 03/05/2014    Procedure: open abdominal wall exploration with removal of mass possible mesh repair ;  Surgeon: [name], MD;  Location: [place];  Service: General;  Laterality: N/A;    History reviewed. No pertinent family history.  History  Substance Use Topics  . Smoking status: Never Smoker   . Smokeless tobacco: Never Used  . Alcohol Use: Yes      Comment: social- not with preg    Allergies:  Allergies  Allergen Reactions  . Azo [Phenazopyridine] Shortness Of Breath and Nausea And Vomiting    Elevated heart rate    Prescriptions prior to admission  Medication Sig Dispense Refill Last Dose  . Prenatal Vit-Fe Fumarate-FA (PRENATAL MULTIVITAMIN) TABS tablet Take 1 tablet by mouth daily at 12 noon.   04/09/2015 at Unknown time    Review of Systems  Constitutional: Negative for fever.  Gastrointestinal: Positive for nausea and abdominal pain. Negative for vomiting, diarrhea and constipation.  Genitourinary: Negative for dysuria, urgency and frequency.  Musculoskeletal: Positive for back pain.   Physical Exam   Blood pressure 111/68, pulse 90, temperature 98.5 F (36.9 C), temperature source Oral, resp. rate 16, last menstrual period 10/19/2014.  Physical Exam  Nursing note and vitals reviewed. Constitutional: She is oriented to person, place, and time. She appears well-developed and well-nourished. No distress.  HENT:  Head: Normocephalic.  Cardiovascular: Normal rate.   Respiratory: Effort normal.  GI: Soft. There is no tenderness. There is no rebound.  Genitourinary:   External: no lesion Vagina: small amount of white discharge. NO pooling, NO blood seen  Closed/thick/high Uterus: AGA  Neurological: She is alert and oriented to person, place, and time.  Skin: Skin is warm and dry.  Psychiatric: She has a normal mood and affect.   FHT 140, moderate with 10x10, variable  Toco: no  UCs   [place]: Fluid: subjectively normal, largest pocket 3.66cm EFW: 684 g, 44%tile Cervical length: 4.37cm MAU Course  Procedures  MDM 0027: D/W Dr. Gaetano Net will get [place] for AFI and EFW   Assessment and Plan   1. Abdominal pain in pregnancy, antepartum   2. Back pain affecting pregnancy in second trimester   3. Round ligament pain    DC home Comfort measures reviewed  2nd Trimester precautions  PTL precautions  Fetal kick  counts RX: none  Return to MAU as needed FU with OB as planned  Follow-up Information    Follow up with [facility] Marjean Donna, MD.   Specialty:  Obstetrics and Gynecology   Why:  As scheduled   Contact information:   [place] [id number] [phone]         Mathis Bud 04/09/2015, 10:49 PM

## 2015-04-10 ENCOUNTER — Inpatient Hospital Stay (HOSPITAL_COMMUNITY): Payer: BLUE CROSS/BLUE SHIELD

## 2015-04-10 DIAGNOSIS — R109 Unspecified abdominal pain: Secondary | ICD-10-CM | POA: Diagnosis not present

## 2015-04-10 DIAGNOSIS — Z3689 Encounter for other specified antenatal screening: Secondary | ICD-10-CM | POA: Insufficient documentation

## 2015-04-10 DIAGNOSIS — Z3A24 24 weeks gestation of pregnancy: Secondary | ICD-10-CM | POA: Insufficient documentation

## 2015-04-10 DIAGNOSIS — O0932 Supervision of pregnancy with insufficient antenatal care, second trimester: Secondary | ICD-10-CM | POA: Insufficient documentation

## 2015-04-10 DIAGNOSIS — Z3A25 25 weeks gestation of pregnancy: Secondary | ICD-10-CM | POA: Diagnosis not present

## 2015-04-10 DIAGNOSIS — M549 Dorsalgia, unspecified: Secondary | ICD-10-CM | POA: Insufficient documentation

## 2015-04-10 DIAGNOSIS — O9989 Other specified diseases and conditions complicating pregnancy, childbirth and the puerperium: Secondary | ICD-10-CM | POA: Diagnosis not present

## 2015-04-10 DIAGNOSIS — O26899 Other specified pregnancy related conditions, unspecified trimester: Secondary | ICD-10-CM | POA: Insufficient documentation

## 2015-04-10 NOTE — Discharge Instructions (Signed)
Second Trimester of Pregnancy °The second trimester is from week 13 through week 28, months 4 through 6. The second trimester is often a time when you feel your best. Your body has also adjusted to being pregnant, and you begin to feel better physically. Usually, morning sickness has lessened or quit completely, you may have more energy, and you may have an increase in appetite. The second trimester is also a time when the fetus is growing rapidly. At the end of the sixth month, the fetus is about 9 inches long and weighs about 1½ pounds. You will likely begin to feel the baby move (quickening) between 18 and 20 weeks of the pregnancy. °BODY CHANGES °Your body goes through many changes during pregnancy. The changes vary from woman to woman.  °· Your weight will continue to increase. You will notice your lower abdomen bulging out. °· You may begin to get stretch marks on your hips, abdomen, and breasts. °· You may develop headaches that can be relieved by medicines approved by your health care provider. °· You may urinate more often because the fetus is pressing on your bladder. °· You may develop or continue to have heartburn as a result of your pregnancy. °· You may develop constipation because certain hormones are causing the muscles that push waste through your intestines to slow down. °· You may develop hemorrhoids or swollen, bulging veins (varicose veins). °· You may have back pain because of the weight gain and pregnancy hormones relaxing your joints between the bones in your pelvis and as a result of a shift in weight and the muscles that support your balance. °· Your breasts will continue to grow and be tender. °· Your gums may bleed and may be sensitive to brushing and flossing. °· Dark spots or blotches (chloasma, mask of pregnancy) may develop on your face. This will likely fade after the baby is born. °· A dark line from your belly button to the pubic area (linea nigra) may appear. This will likely fade  after the baby is born. °· You may have changes in your hair. These can include thickening of your hair, rapid growth, and changes in texture. Some women also have hair loss during or after pregnancy, or hair that feels dry or thin. Your hair will most likely return to normal after your baby is born. °WHAT TO EXPECT AT YOUR PRENATAL VISITS °During a routine prenatal visit: °· You will be weighed to make sure you and the fetus are growing normally. °· Your blood pressure will be taken. °· Your abdomen will be measured to track your baby's growth. °· The fetal heartbeat will be listened to. °· Any test results from the previous visit will be discussed. °Your health care provider may ask you: °· How you are feeling. °· If you are feeling the baby move. °· If you have had any abnormal symptoms, such as leaking fluid, bleeding, severe headaches, or abdominal cramping. °· If you have any questions. °Other tests that may be performed during your second trimester include: °· Blood tests that check for: °¨ Low iron levels (anemia). °¨ Gestational diabetes (between 24 and 28 weeks). °¨ Rh antibodies. °· Urine tests to check for infections, diabetes, or protein in the urine. °· An ultrasound to confirm the proper growth and development of the baby. °· An amniocentesis to check for possible genetic problems. °· Fetal screens for spina bifida and Down syndrome. °HOME CARE INSTRUCTIONS  °· Avoid all smoking, herbs, alcohol, and unprescribed   drugs. These chemicals affect the formation and growth of the baby.  Follow your health care provider's instructions regarding medicine use. There are medicines that are either safe or unsafe to take during pregnancy.  Exercise only as directed by your health care provider. Experiencing uterine cramps is a good sign to stop exercising.  Continue to eat regular, healthy meals.  Wear a good support bra for breast tenderness.  Do not use hot tubs, steam rooms, or saunas.  Wear your  seat belt at all times when driving.  Avoid raw meat, uncooked cheese, cat litter boxes, and soil used by cats. These carry germs that can cause birth defects in the baby.  Take your prenatal vitamins.  Try taking a stool softener (if your health care provider approves) if you develop constipation. Eat more high-fiber foods, such as fresh vegetables or fruit and whole grains. Drink plenty of fluids to keep your urine clear or pale yellow.  Take warm sitz baths to soothe any pain or discomfort caused by hemorrhoids. Use hemorrhoid cream if your health care provider approves.  If you develop varicose veins, wear support hose. Elevate your feet for 15 minutes, 3-4 times a day. Limit salt in your diet.  Avoid heavy lifting, wear low heel shoes, and practice good posture.  Rest with your legs elevated if you have leg cramps or low back pain.  Visit your dentist if you have not gone yet during your pregnancy. Use a soft toothbrush to brush your teeth and be gentle when you floss.  A sexual relationship may be continued unless your health care provider directs you otherwise.  Continue to go to all your prenatal visits as directed by your health care provider. SEEK MEDICAL CARE IF:   You have dizziness.  You have mild pelvic cramps, pelvic pressure, or nagging pain in the abdominal area.  You have persistent nausea, vomiting, or diarrhea.  You have a bad smelling vaginal discharge.  You have pain with urination. SEEK IMMEDIATE MEDICAL CARE IF:   You have a fever.  You are leaking fluid from your vagina.  You have spotting or bleeding from your vagina.  You have severe abdominal cramping or pain.  You have rapid weight gain or loss.  You have shortness of breath with chest pain.  You notice sudden or extreme swelling of your face, hands, ankles, feet, or legs.  You have not felt your baby move in over an hour.  You have severe headaches that do not go away with  medicine.  You have vision changes. Document Released: 09/07/2001 Document Revised: 09/18/2013 Document Reviewed: 11/14/2012 [facility] Patient Information 2015 [place], [place]. This information is not intended to replace advice given to you by your health care provider. Make sure you discuss any questions you have with your health care provider.  Round Ligament Pain During Pregnancy Round ligament pain is a sharp pain or jabbing feeling often felt in the lower belly or groin area on one or both sides. It is one of the most common complaints during pregnancy and is considered a normal part of pregnancy. It is most often felt during the second trimester.  Here is what you need to know about round ligament pain, including some tips to help you feel better.  Causes of Round Ligament Pain  Several thick ligaments surround and support your womb (uterus) as it grows during pregnancy. One of them is called the round ligament.  The round ligament connects the front part of the womb to  your groin, the area where your legs attach to your pelvis. The round ligament normally tightens and relaxes slowly.  As your baby and womb grow, the round ligament stretches. That makes it more likely to become strained.  Sudden movements can cause the ligament to tighten quickly, like a rubber band snapping. This causes a sudden and quick jabbing feeling.  Symptoms of Round Ligament Pain  Round ligament pain can be concerning and uncomfortable. But it is considered normal as your body changes during pregnancy.  The symptoms of round ligament pain include a sharp, sudden spasm in the belly. It usually affects the right side, but it may happen on both sides. The pain only lasts a few seconds.  Exercise may cause the pain, as will rapid movements such as:  sneezing coughing laughing rolling over in bed standing up too quickly  Treatment of Round Ligament Pain  Here are some tips that may help reduce your  discomfort:  Pain relief. Take over-the-counter acetaminophen for pain, if necessary. Ask your doctor if this is OK.  Exercise. Get plenty of exercise to keep your stomach (core) muscles strong. Doing stretching exercises or prenatal yoga can be helpful. Ask your doctor which exercises are safe for you and your baby.  A helpful exercise involves putting your hands and knees on the floor, lowering your head, and pushing your backside into the air.  Avoid sudden movements. Change positions slowly (such as standing up or sitting down) to avoid sudden movements that may cause stretching and pain.  Flex your hips. Bend and flex your hips before you cough, sneeze, or laugh to avoid pulling on the ligaments.  Apply warmth. A heating pad or warm bath may be helpful. Ask your doctor if this is OK. Extreme heat can be dangerous to the baby.  You should try to modify your daily activity level and avoid positions that may worsen the condition.  When to Call the Doctor/Midwife  Always tell your doctor or midwife about any type of pain you have during pregnancy. Round ligament pain is quick and doesn't last long.  Call your health care provider immediately if you have:  severe pain fever chills pain on urination difficulty walking  Belly pain during pregnancy can be due to many different causes. It is important for your doctor to rule out more serious conditions, including pregnancy complications such as placenta abruption or non-pregnancy illnesses such as:  inguinal hernia appendicitis stomach, liver, and kidney problems Preterm labor pains may sometimes be mistaken for round ligament pain.

## 2015-05-09 ENCOUNTER — Inpatient Hospital Stay (HOSPITAL_COMMUNITY)
Admission: AD | Admit: 2015-05-09 | Discharge: 2015-05-09 | Disposition: A | Discharge status: Home / Self Care | Payer: Medicaid Other | Source: Ambulatory Visit | Attending: Obstetrics and Gynecology | Admitting: Obstetrics and Gynecology

## 2015-05-09 ENCOUNTER — Encounter (HOSPITAL_COMMUNITY): Payer: Self-pay | Admitting: *Deleted

## 2015-05-09 DIAGNOSIS — Z3A28 28 weeks gestation of pregnancy: Secondary | ICD-10-CM | POA: Diagnosis not present

## 2015-05-09 DIAGNOSIS — O368131 Decreased fetal movements, third trimester, fetus 1: Secondary | ICD-10-CM

## 2015-05-09 DIAGNOSIS — O368130 Decreased fetal movements, third trimester, not applicable or unspecified: Secondary | ICD-10-CM | POA: Insufficient documentation

## 2015-05-09 NOTE — MAU Note (Signed)
EFM applied at 2201 but unable to get into [facility] at 2220. Paper strip ran until pt in [facility]

## 2015-05-09 NOTE — Discharge Instructions (Signed)

## 2015-05-09 NOTE — MAU Note (Signed)
Over past few days not much fetal movement. Each day less and less FM. Pain R upper quadrant for couple wks. Palms itching for few days.

## 2015-05-09 NOTE — Progress Notes (Signed)
Lori Clemmons CNM in earlier to discuss d/c plan with pt. To eat low fat diet this wkend and has f/u appt Monday at [facility].

## 2015-05-09 NOTE — Progress Notes (Signed)
Lori Clemmons CNM in to see pt. EFm strip reviewed by provider and efm d/ced per provider for pt to go home

## 2015-05-09 NOTE — MAU Provider Note (Signed)
  History     CSN: [id number]  Arrival date and time: 05/09/15 2120   First Provider Initiated Contact with Patient 05/09/15 2204      Chief Complaint  Patient presents with  . Decreased Fetal Movement   HPI  Mary Cannon 29 y.o. .G2P0100 @ [redacted]w[redacted]d presents with decreased fetal movmeent over the past few days. Denies contractions, vaginal bleeding. LOF.  Past Medical History  Diagnosis Date  . Endometriosis   . Ovarian cyst   . Abdominal wall mass   . Seasonal allergies     Past Surgical History  Procedure Laterality Date  . Cesarean section    . Umbilical hernia repair N/A 03/05/2014    Procedure: open abdominal wall exploration with removal of mass possible mesh repair ;  Surgeon: [name], MD;  Location: [place];  Service: General;  Laterality: N/A;    History reviewed. No pertinent family history.  Social History  Substance Use Topics  . Smoking status: Never Smoker   . Smokeless tobacco: Never Used  . Alcohol Use: Yes     Comment: social- not with preg    Allergies:  Allergies  Allergen Reactions  . Azo [Phenazopyridine] Shortness Of Breath and Nausea And Vomiting    Elevated heart rate    Prescriptions prior to admission  Medication Sig Dispense Refill Last Dose  . Prenatal Vit-Fe Fumarate-FA (PRENATAL MULTIVITAMIN) TABS tablet Take 1 tablet by mouth daily at 12 noon.   05/08/2015 at Unknown time    Review of Systems  Constitutional: Negative for fever.  All other systems reviewed and are negative.  Physical Exam   Blood pressure 113/73, pulse 91, temperature 98.3 F (36.8 C), resp. rate 18, height 5\' 4"  (1.626 m), weight 84.55 kg (186 lb 6.4 oz), last menstrual period 10/19/2014.  Physical Exam  Nursing note and vitals reviewed. Constitutional: She is oriented to person, place, and time. She appears well-developed and well-nourished. No distress.  Cardiovascular: Normal rate.   Respiratory: Effort normal. No respiratory distress.  GI:  Soft. There is no tenderness.  Musculoskeletal: Normal range of motion.  Neurological: She is alert and oriented to person, place, and time.  Skin: Skin is warm and dry.  Psychiatric: She has a normal mood and affect. Her behavior is normal. Judgment and thought content normal.    MAU Course  Procedures  MDM Fetal Non stress Test- Reactive; Spoke to Dr Julien Girt; Pt will be discharged  Assessment and Plan  Decreased Fetal Movement  Discharge to home  [facility] 05/09/2015, 10:33 PM

## 2015-07-12 ENCOUNTER — Inpatient Hospital Stay (HOSPITAL_COMMUNITY)
Admission: AD | Admit: 2015-07-12 | Discharge: 2015-07-12 | Disposition: A | Discharge status: Home / Self Care | Payer: BLUE CROSS/BLUE SHIELD | Source: Ambulatory Visit | Attending: Obstetrics and Gynecology | Admitting: Obstetrics and Gynecology

## 2015-07-12 ENCOUNTER — Encounter (HOSPITAL_COMMUNITY): Payer: Self-pay

## 2015-07-12 DIAGNOSIS — G44019 Episodic cluster headache, not intractable: Secondary | ICD-10-CM | POA: Diagnosis not present

## 2015-07-12 DIAGNOSIS — Z3A38 38 weeks gestation of pregnancy: Secondary | ICD-10-CM | POA: Insufficient documentation

## 2015-07-12 DIAGNOSIS — R51 Headache: Secondary | ICD-10-CM | POA: Diagnosis present

## 2015-07-12 DIAGNOSIS — O26893 Other specified pregnancy related conditions, third trimester: Secondary | ICD-10-CM | POA: Insufficient documentation

## 2015-07-12 DIAGNOSIS — R110 Nausea: Secondary | ICD-10-CM | POA: Insufficient documentation

## 2015-07-12 LAB — URINE MICROSCOPIC-ADD ON

## 2015-07-12 LAB — COMPREHENSIVE METABOLIC PANEL
ALT: 13 U/L — ABNORMAL LOW (ref 14–54)
AST: 15 U/L (ref 15–41)
Albumin: 3 g/dL — ABNORMAL LOW (ref 3.5–5.0)
Alkaline Phosphatase: 111 U/L (ref 38–126)
Anion gap: 5 (ref 5–15)
BUN: 6 mg/dL (ref 6–20)
CO2: 22 mmol/L (ref 22–32)
Calcium: 8.8 mg/dL — ABNORMAL LOW (ref 8.9–10.3)
Chloride: 107 mmol/L (ref 101–111)
Creatinine, Ser: 0.4 mg/dL — ABNORMAL LOW (ref 0.44–1.00)
GFR calc Af Amer: >60 mL/min (ref >60)
GFR calc non Af Amer: >60 mL/min (ref >60)
Glucose, Bld: 80 mg/dL (ref 65–99)
Potassium: 3.9 mmol/L (ref 3.5–5.1)
Sodium: 134 mmol/L — ABNORMAL LOW (ref 135–145)
Total Bilirubin: 0.3 mg/dL (ref 0.3–1.2)
Total Protein: 5.9 g/dL — ABNORMAL LOW (ref 6.5–8.1)

## 2015-07-12 LAB — CBC
HCT: 34 % — ABNORMAL LOW (ref 36.0–46.0)
Hemoglobin: 11.7 g/dL — ABNORMAL LOW (ref 12.0–15.0)
MCH: 29.7 pg (ref 26.0–34.0)
MCHC: 34.4 g/dL (ref 30.0–36.0)
MCV: 86.3 fL (ref 78.0–100.0)
Platelets: 185 10*3/uL (ref 150–400)
RBC: 3.94 MIL/uL (ref 3.87–5.11)
RDW: 13.7 % (ref 11.5–15.5)
WBC: 13.9 10*3/uL — ABNORMAL HIGH (ref 4.0–10.5)

## 2015-07-12 LAB — URINALYSIS, ROUTINE W REFLEX MICROSCOPIC
Bilirubin Urine: NEGATIVE
Glucose, UA: NEGATIVE mg/dL
HGB URINE DIPSTICK: NEGATIVE
Ketones, ur: NEGATIVE mg/dL
Nitrite: NEGATIVE
PROTEIN: NEGATIVE mg/dL
SPECIFIC GRAVITY, URINE: 1.01 (ref 1.005–1.030)
Urobilinogen, UA: 0.2 mg/dL (ref 0.0–1.0)
pH: 6.5 (ref 5.0–8.0)

## 2015-07-12 LAB — PROTEIN / CREATININE RATIO, URINE
Creatinine, Urine: 61 mg/dL
Protein Creatinine Ratio: 0.13 mg/mg{Cre} (ref 0.00–0.15)
Total Protein, Urine: 8 mg/dL

## 2015-07-12 MED ORDER — NITROFURANTOIN MONOHYD MACRO 100 MG PO CAPS: 100.0000 mg | ORAL_CAPSULE | Freq: Two times a day (BID) | ORAL | Status: AC

## 2015-07-12 MED ORDER — HYDROCODONE-ACETAMINOPHEN 5-325 MG PO TABS
1.0000 | ORAL_TABLET | Freq: Once | ORAL | Status: AC
  Administered 2015-07-12: 1 via ORAL
  Filled 2015-07-12: qty 1

## 2015-07-12 MED ORDER — OXYCODONE-ACETAMINOPHEN 5-325 MG PO TABS: 1.0000 | ORAL_TABLET | ORAL | Status: DC | PRN

## 2015-07-12 NOTE — Progress Notes (Signed)
Has not taken anything for headache. Got flu shot on THurs

## 2015-07-12 NOTE — MAU Provider Note (Signed)
History     CSN: [id number]  Arrival date and time: 07/12/15 8938   First Provider Initiated Contact with Patient 07/12/15 2052      Chief Complaint  Patient presents with  . Headache  . Blurred Vision  . Nausea   HPI  Mary Cannon 29 y.o. G2P0100 [redacted]w[redacted]d presents with nausea, headache, seeing spots all day.Denies vaginal bleeding, contractions, LOF.  Past Medical History  Diagnosis Date  . Endometriosis   . Ovarian cyst   . Abdominal wall mass   . Seasonal allergies     Past Surgical History  Procedure Laterality Date  . Cesarean section    . Umbilical hernia repair N/A 03/05/2014    Procedure: open abdominal wall exploration with removal of mass possible mesh repair ;  Surgeon: [name], MD;  Location: [place];  Service: General;  Laterality: N/A;    History reviewed. No pertinent family history.  Social History  Substance Use Topics  . Smoking status: Never Smoker   . Smokeless tobacco: Never Used  . Alcohol Use: Yes     Comment: social- not with preg    Allergies:  Allergies  Allergen Reactions  . Azo [Phenazopyridine] Shortness Of Breath and Nausea And Vomiting    Elevated heart rate    Prescriptions prior to admission  Medication Sig Dispense Refill Last Dose  . desonide (DESOWEN) 0.05 % lotion Apply 1 application topically 2 (two) times daily as needed (rash).   07/11/2015 at Unknown time  . Prenatal Vit-Fe Fumarate-FA (PRENATAL MULTIVITAMIN) TABS tablet Take 1 tablet by mouth daily at 12 noon.   Past Week at Unknown time    Review of Systems  Constitutional: Negative for fever.  Eyes: Positive for blurred vision.  Gastrointestinal: Positive for nausea.  Neurological: Positive for headaches.  All other systems reviewed and are negative.  Physical Exam   Blood pressure 106/68, pulse 76, temperature 98.2 F (36.8 C), resp. rate 20, height 5\' 4"  (1.626 m), weight 90.992 kg (200 lb 9.6 oz), last menstrual period 10/19/2014.  Physical Exam   Nursing note and vitals reviewed. Constitutional: She is oriented to person, place, and time. She appears well-developed and well-nourished. No distress.  HENT:  Head: Normocephalic and atraumatic.  Neck: Normal range of motion.  Cardiovascular: Normal rate.   Respiratory: Effort normal. No respiratory distress.  GI: There is tenderness.  RUQ  Musculoskeletal: Normal range of motion. She exhibits no edema.  Neurological: She is alert and oriented to person, place, and time. She has normal reflexes. She displays normal reflexes.  Skin: Skin is warm and dry.  Psychiatric: She has a normal mood and affect. Her behavior is normal. Judgment and thought content normal.   Results for orders placed or performed during the hospital encounter of 07/12/15 (from the past 24 hour(s))  Urinalysis, Routine w reflex microscopic (not at [facility])     Status: Abnormal   Collection Time: 07/12/15  7:50 PM  Result Value Ref Range   Color, Urine YELLOW YELLOW   APPearance CLEAR CLEAR   Specific Gravity, Urine 1.010 1.005 - 1.030   pH 6.5 5.0 - 8.0   Glucose, UA NEGATIVE NEGATIVE mg/dL   Hgb urine dipstick NEGATIVE NEGATIVE   Bilirubin Urine NEGATIVE NEGATIVE   Ketones, ur NEGATIVE NEGATIVE mg/dL   Protein, ur NEGATIVE NEGATIVE mg/dL   Urobilinogen, UA 0.2 0.0 - 1.0 mg/dL   Nitrite NEGATIVE NEGATIVE   Leukocytes, UA MODERATE (A) NEGATIVE  Urine microscopic-add on  Status: Abnormal   Collection Time: 07/12/15  7:50 PM  Result Value Ref Range   Squamous Epithelial / LPF FEW (A) RARE   WBC, UA 7-10 <3 WBC/hpf   Bacteria, UA FEW (A) RARE  Protein / creatinine ratio, urine     Status: None   Collection Time: 07/12/15  7:50 PM  Result Value Ref Range   Creatinine, Urine 61.00 mg/dL   Total Protein, Urine 8 mg/dL   Protein Creatinine Ratio 0.13 0.00 - 0.15 mg/mg[Cre]  CBC     Status: Abnormal   Collection Time: 07/12/15  9:50 PM  Result Value Ref Range   WBC 13.9 (H) 4.0 - 10.5 K/uL   RBC 3.94 3.87  - 5.11 MIL/uL   Hemoglobin 11.7 (L) 12.0 - 15.0 g/dL   HCT 34.0 (L) 36.0 - 46.0 %   MCV 86.3 78.0 - 100.0 fL   MCH 29.7 26.0 - 34.0 pg   MCHC 34.4 30.0 - 36.0 g/dL   RDW 13.7 11.5 - 15.5 %   Platelets 185 150 - 400 K/uL  Comprehensive metabolic panel     Status: Abnormal   Collection Time: 07/12/15  9:50 PM  Result Value Ref Range   Sodium 134 (L) 135 - 145 mmol/L   Potassium 3.9 3.5 - 5.1 mmol/L   Chloride 107 101 - 111 mmol/L   CO2 22 22 - 32 mmol/L   Glucose, Bld 80 65 - 99 mg/dL   BUN 6 6 - 20 mg/dL   Creatinine, Ser 0.40 (L) 0.44 - 1.00 mg/dL   Calcium 8.8 (L) 8.9 - 10.3 mg/dL   Total Protein 5.9 (L) 6.5 - 8.1 g/dL   Albumin 3.0 (L) 3.5 - 5.0 g/dL   AST 15 15 - 41 U/L   ALT 13 (L) 14 - 54 U/L   Alkaline Phosphatase 111 38 - 126 U/L   Total Bilirubin 0.3 0.3 - 1.2 mg/dL   GFR calc non Af Amer >60 >60 mL/min   GFR calc Af Amer >60 >60 mL/min   Anion gap 5 5 - 15   MAU Course  Procedures  MDM Dr Corinna Capra notified; Pending Pre -E ; FHT's Cat 1 ; [facility] closed. Will treat for UTI and discharge to home  Assessment and Plan  Headache Lortab RX [place] RX Discharge to home  [place] 07/12/2015, 9:00 PM

## 2015-07-12 NOTE — Discharge Instructions (Signed)

## 2015-07-12 NOTE — MAU Note (Addendum)
Woke up with h/a and was nauseated. Ate something and did not feel better. Headache in eyes. Vision blurred so put on glasses and made it worse. Have had migraines in past but related to foods. Head hurts all over and back of neck. Some pain off and on for months RUQ that radiates to back which MD aware and states may be gallbladder. No diarrhea. Had flu shot on THurs. Hands swollen today. States has gained 5lbs since [employer]

## 2015-07-18 ENCOUNTER — Encounter (HOSPITAL_COMMUNITY): Payer: Self-pay

## 2015-07-18 ENCOUNTER — Encounter (HOSPITAL_COMMUNITY)
Admission: RE | Admit: 2015-07-18 | Discharge: 2015-07-18 | Disposition: A | Discharge status: Home / Self Care | Payer: Medicaid Other | Source: Ambulatory Visit | Attending: Obstetrics and Gynecology | Admitting: Obstetrics and Gynecology

## 2015-07-18 HISTORY — DX: Anemia, unspecified: D64.9

## 2015-07-18 HISTORY — DX: Headache: R51

## 2015-07-18 HISTORY — DX: Reserved for inherently not codable concepts without codable children: IMO0001

## 2015-07-18 HISTORY — DX: Headache, unspecified: R51.9

## 2015-07-18 LAB — CBC
HCT: 35.9 % — ABNORMAL LOW (ref 36.0–46.0)
Hemoglobin: 12.1 g/dL (ref 12.0–15.0)
MCH: 29.3 pg (ref 26.0–34.0)
MCHC: 33.7 g/dL (ref 30.0–36.0)
MCV: 86.9 fL (ref 78.0–100.0)
PLATELETS: 195 10*3/uL (ref 150–400)
RBC: 4.13 MIL/uL (ref 3.87–5.11)
RDW: 13.6 % (ref 11.5–15.5)
WBC: 13 10*3/uL — ABNORMAL HIGH (ref 4.0–10.5)

## 2015-07-18 LAB — TYPE AND SCREEN
ABO/RH(D): O POS
ANTIBODY SCREEN: NEGATIVE

## 2015-07-18 LAB — ABO/RH: ABO/RH(D): O POS

## 2015-07-18 NOTE — Patient Instructions (Addendum)
Your procedure is scheduled on:  Monday, Oct, 24, 2016  Enter through the Main Entrance of [facility] at:  11:30 am  Pick up the phone at the desk and dial [phone].  Call this number if you have problems the morning of surgery: [phone].  Remember: Do NOT eat food:  AFTER MIDNIGHT SUNDAY Do NOT drink clear liquids after:  9:00 AM MORNING OF SURGERY Take these medicines the morning of surgery with a SIP OF WATER: NONE  Do NOT wear jewelry (body piercing), metal hair clips/bobby pins,  or nail polish. Do NOT wear lotions, powders, or perfumes.  You may wear deoderant. Do NOT shave for 48 hours prior to surgery. Do NOT bring valuables to the hospital.  Leave suitcase in car.  After surgery it may be brought to your room.  For patients admitted to the hospital, checkout time is 11:00 AM the day of discharge.

## 2015-07-19 LAB — RPR: RPR: NONREACTIVE

## 2015-07-20 MED ORDER — DEXTROSE 5 % IV SOLN
2.0000 g | INTRAVENOUS | Status: AC
  Administered 2015-07-21: 2 g via INTRAVENOUS
  Filled 2015-07-20: qty 2

## 2015-07-20 MED ORDER — DEXTROSE 5 % IV SOLN
2.0000 g | INTRAVENOUS | Status: DC
  Filled 2015-07-20: qty 2

## 2015-07-20 NOTE — H&P (Addendum)
Mary Cannon is a 29 y.o. female presenting for repeat Cannon/s.  Pregnancy complicated by previous preterm delivery where neonate did not survive due to prematurity.  She desires repeat Cannon/s. History OB History    Gravida Para Term Preterm AB TAB SAB Ectopic Multiple Living   2 1  1  0     0      Obstetric Comments   1st pregnancy delivered at 27 weeks, partial abruption, baby died within 24 hours of delivery.     Past Medical History  Diagnosis Date  . Endometriosis   . Ovarian cyst   . Abdominal wall mass   . Seasonal allergies   . Shortness of breath dyspnea     with pregnancy  . Headache     Migraines  . Anemia    Past Surgical History  Procedure Laterality Date  . Cesarean section    . Umbilical hernia repair N/A 03/05/2014    Procedure: open abdominal wall exploration with removal of mass possible mesh repair ;  Surgeon: [name], MD;  Location: [place];  Service: General;  Laterality: N/A;   Family History: family history is not on file. Social History:  reports that she has never smoked. She has never used smokeless tobacco. She reports that she drinks alcohol. She reports that she does not use illicit drugs.   Prenatal Transfer Tool  Maternal Diabetes: No Genetic Screening: Normal Maternal Ultrasounds/Referrals: Normal Fetal Ultrasounds or other Referrals:  None Maternal Substance Abuse:  No Significant Maternal Medications:  None Significant Maternal Lab Results:  None Other Comments:  None  ROS    Last menstrual period 10/19/2014. Exam Physical Exam  Prenatal labs: ABO, Rh: --/--/O POS, O POS (10/21 1430) Antibody: NEG (10/21 1430) Rubella: Nonimmune (03/22 0000) RPR: Non Reactive (10/21 1430)  HBsAg: Negative (03/22 0000)  HIV: Non-reactive (03/22 0000)  GBS:     Assessment/Plan: IUP at 39 weeks Prev Cannon/S desires repeat Risks and benefits of Cannon/S were discussed.  All questions were answered and informed consent was obtained.  Plan to proceed with  low segment transverse Cesarean Section.   Mary Cannon 07/20/2015, 10:50 PM    This patient has been seen and examined.   All of her questions were answered.  Labs and vital signs reviewed.  Informed consent has been obtained.  The History and Physical is current. 07/21/15 1300 DL

## 2015-07-21 ENCOUNTER — Inpatient Hospital Stay (HOSPITAL_COMMUNITY): Payer: Medicaid Other | Admitting: Anesthesiology

## 2015-07-21 ENCOUNTER — Encounter (HOSPITAL_COMMUNITY)
Admission: AD | Disposition: A | Discharge status: Home / Self Care | Payer: Self-pay | Source: Ambulatory Visit | Attending: Obstetrics and Gynecology

## 2015-07-21 ENCOUNTER — Inpatient Hospital Stay (HOSPITAL_COMMUNITY)
Admission: AD | Admit: 2015-07-21 | Discharge: 2015-07-23 | DRG: 766 | Disposition: A | Discharge status: Home / Self Care | Payer: Medicaid Other | Source: Ambulatory Visit | Attending: Obstetrics and Gynecology | Admitting: Obstetrics and Gynecology

## 2015-07-21 ENCOUNTER — Encounter (HOSPITAL_COMMUNITY): Payer: Self-pay | Admitting: Registered Nurse

## 2015-07-21 DIAGNOSIS — O34211 Maternal care for low transverse scar from previous cesarean delivery: Principal | ICD-10-CM | POA: Diagnosis present

## 2015-07-21 DIAGNOSIS — E669 Obesity, unspecified: Secondary | ICD-10-CM | POA: Diagnosis present

## 2015-07-21 DIAGNOSIS — Z3A39 39 weeks gestation of pregnancy: Secondary | ICD-10-CM | POA: Diagnosis not present

## 2015-07-21 DIAGNOSIS — O34219 Maternal care for unspecified type scar from previous cesarean delivery: Secondary | ICD-10-CM | POA: Diagnosis present

## 2015-07-21 DIAGNOSIS — Z6834 Body mass index (BMI) 34.0-34.9, adult: Secondary | ICD-10-CM | POA: Diagnosis not present

## 2015-07-21 DIAGNOSIS — O99214 Obesity complicating childbirth: Secondary | ICD-10-CM | POA: Diagnosis present

## 2015-07-21 SURGERY — Surgical Case
Anesthesia: Spinal

## 2015-07-21 MED ORDER — FENTANYL CITRATE (PF) 100 MCG/2ML IJ SOLN
25.0000 ug | INTRAMUSCULAR | Status: DC | PRN
Start: 2015-07-21 — End: 2015-07-21

## 2015-07-21 MED ORDER — SCOPOLAMINE 1 MG/3DAYS TD PT72
1.0000 | MEDICATED_PATCH | Freq: Once | TRANSDERMAL | Status: DC
Start: 2015-07-21 — End: 2015-07-21
  Administered 2015-07-21: 1.5 mg via TRANSDERMAL

## 2015-07-21 MED ORDER — ZOLPIDEM TARTRATE 5 MG PO TABS: 5.0000 mg | ORAL_TABLET | Freq: Every evening | ORAL | Status: DC | PRN

## 2015-07-21 MED ORDER — PHENYLEPHRINE 8 MG IN D5W 100 ML (0.08MG/ML) PREMIX OPTIME
INJECTION | INTRAVENOUS | Status: AC
  Filled 2015-07-21: qty 100

## 2015-07-21 MED ORDER — NALBUPHINE HCL 10 MG/ML IJ SOLN: 5.0000 mg | INTRAMUSCULAR | Status: DC | PRN

## 2015-07-21 MED ORDER — KETOROLAC TROMETHAMINE 30 MG/ML IJ SOLN: 30.0000 mg | Freq: Four times a day (QID) | INTRAMUSCULAR | Status: AC | PRN

## 2015-07-21 MED ORDER — 0.9 % SODIUM CHLORIDE (POUR BTL) OPTIME
TOPICAL | Status: DC | PRN
  Administered 2015-07-21: 1000 mL

## 2015-07-21 MED ORDER — SIMETHICONE 80 MG PO CHEW
80.0000 mg | CHEWABLE_TABLET | ORAL | Status: DC
  Administered 2015-07-22 – 2015-07-23 (×2): 80 mg via ORAL
  Filled 2015-07-21 (×2): qty 1

## 2015-07-21 MED ORDER — MORPHINE SULFATE (PF) 0.5 MG/ML IJ SOLN
INTRAMUSCULAR | Status: DC | PRN
  Administered 2015-07-21: .2 mg via INTRATHECAL

## 2015-07-21 MED ORDER — ONDANSETRON HCL 4 MG/2ML IJ SOLN
INTRAMUSCULAR | Status: AC
  Filled 2015-07-21: qty 2

## 2015-07-21 MED ORDER — DIPHENHYDRAMINE HCL 50 MG/ML IJ SOLN: 12.5000 mg | INTRAMUSCULAR | Status: DC | PRN

## 2015-07-21 MED ORDER — ONDANSETRON HCL 4 MG/2ML IJ SOLN: 4.0000 mg | Freq: Three times a day (TID) | INTRAMUSCULAR | Status: DC | PRN

## 2015-07-21 MED ORDER — SENNOSIDES-DOCUSATE SODIUM 8.6-50 MG PO TABS
2.0000 | ORAL_TABLET | ORAL | Status: DC
  Administered 2015-07-22 – 2015-07-23 (×2): 2 via ORAL
  Filled 2015-07-21 (×2): qty 2

## 2015-07-21 MED ORDER — PRENATAL MULTIVITAMIN CH
1.0000 | ORAL_TABLET | Freq: Every day | ORAL | Status: DC
  Administered 2015-07-22: 1 via ORAL
  Filled 2015-07-21: qty 1

## 2015-07-21 MED ORDER — OXYTOCIN 40 UNITS IN LACTATED RINGERS INFUSION - SIMPLE MED: 62.5000 mL/h | INTRAVENOUS | Status: AC

## 2015-07-21 MED ORDER — NALBUPHINE HCL 10 MG/ML IJ SOLN: 5.0000 mg | Freq: Once | INTRAMUSCULAR | Status: DC | PRN

## 2015-07-21 MED ORDER — ONDANSETRON HCL 4 MG/2ML IJ SOLN
INTRAMUSCULAR | Status: DC | PRN
  Administered 2015-07-21: 4 mg via INTRAVENOUS

## 2015-07-21 MED ORDER — LACTATED RINGERS IV SOLN
Freq: Once | INTRAVENOUS | Status: AC
  Administered 2015-07-21 (×3): via INTRAVENOUS

## 2015-07-21 MED ORDER — ONDANSETRON HCL 4 MG/2ML IJ SOLN: 4.0000 mg | Freq: Once | INTRAMUSCULAR | Status: DC | PRN

## 2015-07-21 MED ORDER — SIMETHICONE 80 MG PO CHEW
80.0000 mg | CHEWABLE_TABLET | ORAL | Status: DC | PRN
  Administered 2015-07-23: 80 mg via ORAL

## 2015-07-21 MED ORDER — LACTATED RINGERS IV SOLN
INTRAVENOUS | Status: DC
  Administered 2015-07-21: 20:00:00 via INTRAVENOUS

## 2015-07-21 MED ORDER — LANOLIN HYDROUS EX OINT: 1.0000 "application " | TOPICAL_OINTMENT | CUTANEOUS | Status: DC | PRN

## 2015-07-21 MED ORDER — NALBUPHINE HCL 10 MG/ML IJ SOLN
5.0000 mg | INTRAMUSCULAR | Status: DC | PRN
Start: 2015-07-21 — End: 2015-07-23

## 2015-07-21 MED ORDER — SIMETHICONE 80 MG PO CHEW
80.0000 mg | CHEWABLE_TABLET | Freq: Three times a day (TID) | ORAL | Status: DC
  Administered 2015-07-22 (×3): 80 mg via ORAL
  Filled 2015-07-21 (×4): qty 1

## 2015-07-21 MED ORDER — PHENYLEPHRINE 8 MG IN D5W 100 ML (0.08MG/ML) PREMIX OPTIME
INJECTION | INTRAVENOUS | Status: DC | PRN
  Administered 2015-07-21: 60 ug/min via INTRAVENOUS

## 2015-07-21 MED ORDER — MEPERIDINE HCL 25 MG/ML IJ SOLN: 6.2500 mg | INTRAMUSCULAR | Status: DC | PRN

## 2015-07-21 MED ORDER — SCOPOLAMINE 1 MG/3DAYS TD PT72: 1.0000 | MEDICATED_PATCH | Freq: Once | TRANSDERMAL | Status: DC

## 2015-07-21 MED ORDER — MORPHINE SULFATE (PF) 0.5 MG/ML IJ SOLN
INTRAMUSCULAR | Status: AC
Start: 2015-07-21 — End: 2015-07-21
  Filled 2015-07-21: qty 100

## 2015-07-21 MED ORDER — OXYCODONE-ACETAMINOPHEN 5-325 MG PO TABS: 2.0000 | ORAL_TABLET | ORAL | Status: DC | PRN

## 2015-07-21 MED ORDER — FENTANYL CITRATE (PF) 100 MCG/2ML IJ SOLN
INTRAMUSCULAR | Status: DC | PRN
  Administered 2015-07-21: 50 ug via INTRAVENOUS
  Administered 2015-07-21: 40 ug via INTRAVENOUS
  Administered 2015-07-21: 10 ug via INTRATHECAL

## 2015-07-21 MED ORDER — FENTANYL CITRATE (PF) 100 MCG/2ML IJ SOLN
INTRAMUSCULAR | Status: AC
  Filled 2015-07-21: qty 4

## 2015-07-21 MED ORDER — BUPIVACAINE IN DEXTROSE 0.75-8.25 % IT SOLN
INTRATHECAL | Status: DC | PRN
  Administered 2015-07-21: 1.6 mg via INTRATHECAL

## 2015-07-21 MED ORDER — KETOROLAC TROMETHAMINE 30 MG/ML IJ SOLN
INTRAMUSCULAR | Status: AC
  Filled 2015-07-21: qty 1

## 2015-07-21 MED ORDER — DIBUCAINE 1 % RE OINT: 1.0000 "application " | TOPICAL_OINTMENT | RECTAL | Status: DC | PRN

## 2015-07-21 MED ORDER — KETOROLAC TROMETHAMINE 30 MG/ML IJ SOLN
30.0000 mg | Freq: Four times a day (QID) | INTRAMUSCULAR | Status: AC | PRN
  Administered 2015-07-21: 30 mg via INTRAMUSCULAR

## 2015-07-21 MED ORDER — FENTANYL CITRATE (PF) 100 MCG/2ML IJ SOLN: 25.0000 ug | INTRAMUSCULAR | Status: DC | PRN

## 2015-07-21 MED ORDER — ACETAMINOPHEN 325 MG PO TABS: 650.0000 mg | ORAL_TABLET | ORAL | Status: DC | PRN

## 2015-07-21 MED ORDER — OXYTOCIN 10 UNIT/ML IJ SOLN
40.0000 [IU] | INTRAMUSCULAR | Status: DC | PRN
  Administered 2015-07-21: 40 [IU] via INTRAVENOUS

## 2015-07-21 MED ORDER — DIPHENHYDRAMINE HCL 25 MG PO CAPS
25.0000 mg | ORAL_CAPSULE | ORAL | Status: DC | PRN
Start: 2015-07-21 — End: 2015-07-23

## 2015-07-21 MED ORDER — TETANUS-DIPHTH-ACELL PERTUSSIS 5-2.5-18.5 LF-MCG/0.5 IM SUSP: 0.5000 mL | Freq: Once | INTRAMUSCULAR | Status: DC

## 2015-07-21 MED ORDER — NALOXONE HCL 2 MG/2ML IJ SOSY: 1.0000 ug/kg/h | PREFILLED_SYRINGE | INTRAVENOUS | Status: DC | PRN

## 2015-07-21 MED ORDER — DIPHENHYDRAMINE HCL 25 MG PO CAPS: 25.0000 mg | ORAL_CAPSULE | Freq: Four times a day (QID) | ORAL | Status: DC | PRN

## 2015-07-21 MED ORDER — SODIUM CHLORIDE 0.9 % IJ SOLN: 3.0000 mL | INTRAMUSCULAR | Status: DC | PRN

## 2015-07-21 MED ORDER — WITCH HAZEL-GLYCERIN EX PADS: 1.0000 "application " | MEDICATED_PAD | CUTANEOUS | Status: DC | PRN

## 2015-07-21 MED ORDER — SCOPOLAMINE 1 MG/3DAYS TD PT72
MEDICATED_PATCH | TRANSDERMAL | Status: AC
  Administered 2015-07-21: 1.5 mg via TRANSDERMAL
  Filled 2015-07-21: qty 1

## 2015-07-21 MED ORDER — IBUPROFEN 600 MG PO TABS
600.0000 mg | ORAL_TABLET | Freq: Four times a day (QID) | ORAL | Status: DC
  Administered 2015-07-21 – 2015-07-23 (×7): 600 mg via ORAL
  Filled 2015-07-21 (×6): qty 1

## 2015-07-21 MED ORDER — OXYCODONE-ACETAMINOPHEN 5-325 MG PO TABS
1.0000 | ORAL_TABLET | ORAL | Status: DC | PRN
  Administered 2015-07-22: 1 via ORAL
  Filled 2015-07-21: qty 1

## 2015-07-21 MED ORDER — MENTHOL 3 MG MT LOZG: 1.0000 | LOZENGE | OROMUCOSAL | Status: DC | PRN

## 2015-07-21 MED ORDER — OXYTOCIN 10 UNIT/ML IJ SOLN
INTRAMUSCULAR | Status: AC
  Filled 2015-07-21: qty 4

## 2015-07-21 MED ORDER — NALOXONE HCL 0.4 MG/ML IJ SOLN: 0.4000 mg | INTRAMUSCULAR | Status: DC | PRN

## 2015-07-21 SURGICAL SUPPLY — 27 items
CLAMP CORD UMBIL (MISCELLANEOUS) IMPLANT
CLOTH BEACON ORANGE TIMEOUT ST (SAFETY) ×3 IMPLANT
DRAPE SHEET LG 3/4 BI-LAMINATE (DRAPES) IMPLANT
DRSG OPSITE 6X11 MED (GAUZE/BANDAGES/DRESSINGS) ×3 IMPLANT
DRSG OPSITE POSTOP 4X10 (GAUZE/BANDAGES/DRESSINGS) ×3 IMPLANT
DURAPREP 26ML APPLICATOR (WOUND CARE) ×3 IMPLANT
ELECT REM PT RETURN 9FT ADLT (ELECTROSURGICAL) ×3
ELECTRODE REM PT RTRN 9FT ADLT (ELECTROSURGICAL) ×1 IMPLANT
EXTRACTOR VACUUM M CUP 4 TUBE (SUCTIONS) IMPLANT
EXTRACTOR VACUUM M CUP 4' TUBE (SUCTIONS)
GLOVE SURG ORTHO 8.0 STRL STRW (GLOVE) ×3 IMPLANT
GOWN STRL REUS W/TWL LRG LVL3 (GOWN DISPOSABLE) ×6 IMPLANT
KIT ABG SYR 3ML LUER SLIP (SYRINGE) ×3 IMPLANT
NEEDLE HYPO 25X5/8 SAFETYGLIDE (NEEDLE) ×3 IMPLANT
NS IRRIG 1000ML POUR BTL (IV SOLUTION) ×3 IMPLANT
PACK C SECTION WH (CUSTOM PROCEDURE TRAY) ×3 IMPLANT
PAD OB MATERNITY 4.3X12.25 (PERSONAL CARE ITEMS) ×3 IMPLANT
PENCIL SMOKE EVAC W/HOLSTER (ELECTROSURGICAL) ×3 IMPLANT
SUT MNCRL 0 VIOLET CTX 36 (SUTURE) ×3 IMPLANT
SUT MON AB 4-0 PS1 27 (SUTURE) ×6 IMPLANT
SUT MONOCRYL 0 CTX 36 (SUTURE) ×6
SUT PDS AB 1 CT  36 (SUTURE)
SUT PDS AB 1 CT 36 (SUTURE) IMPLANT
SUT VIC AB 1 CTX 36 (SUTURE)
SUT VIC AB 1 CTX36XBRD ANBCTRL (SUTURE) IMPLANT
TOWEL OR 17X24 6PK STRL BLUE (TOWEL DISPOSABLE) ×3 IMPLANT
TRAY FOLEY CATH SILVER 14FR (SET/KITS/TRAYS/PACK) ×3 IMPLANT

## 2015-07-21 NOTE — Anesthesia Postprocedure Evaluation (Signed)
  Anesthesia Post-op Note  Patient: Mary Cannon  Procedure(s) Performed: Procedure(s) (LRB): CESAREAN SECTION (N/A)  Patient Location: PACU  Anesthesia Type: Spinal  Level of Consciousness: awake and alert   Airway and Oxygen Therapy: Patient Spontanous Breathing  Post-op Pain: mild  Post-op Assessment: Post-op Vital signs reviewed, Patient's Cardiovascular Status Stable, Respiratory Function Stable, Patent Airway and No signs of Nausea or vomiting  Last Vitals:  Filed Vitals:   07/21/15 1415  BP: 109/54  Pulse: 92  Temp:   Resp: 11    Post-op Vital Signs: stable   Complications: No apparent anesthesia complications

## 2015-07-21 NOTE — Lactation Note (Signed)
This note was copied from the chart of Mary Cannon. Lactation Consultation Note Initial visit at 4 hours of age.  Mom reports a few good feedings and denies pain.  Assisted with hand expression with a few drops expressed.  Assisted with football hold on right breast.  Baby showing feeding cues and latched with a few attempts.  Stimulation needed to maintain feeding, baby is sleepy, but rooting when taken off breast.  [facility] LC resources given and discussed.  Encouraged to feed with early cues on demand.  Early newborn behavior discussed.  Hand expression encouraged prior to latching and after to rub into nipples.    Mom to call for assist as needed.    Patient Name: Mary Cannon [id number] Date: 07/21/2015 Reason for consult: Initial assessment   Maternal Data Has patient been taught Hand Expression?: Yes Does the patient have breastfeeding experience prior to this delivery?: No  Feeding Feeding Type: Breast Fed Length of feed:  (on and off for 10 minutes observed)  LATCH Score/Interventions Latch: Repeated attempts needed to sustain latch, nipple held in mouth throughout feeding, stimulation needed to elicit sucking reflex. Intervention(s): Adjust position;Assist with latch;Breast massage;Breast compression  Audible Swallowing: A few with stimulation  Type of Nipple: Everted at rest and after stimulation  Comfort (Breast/Nipple): Soft / non-tender     Hold (Positioning): Assistance needed to correctly position infant at breast and maintain latch. Intervention(s): Breastfeeding basics reviewed;Support Pillows;Position options;Skin to skin  LATCH Score: 7  Lactation Tools Discussed/Used     Consult Status Consult Status: Follow-up Date: 07/22/15 Follow-up type: In-patient    [place]w, Justine Null 07/21/2015, 6:14 PM

## 2015-07-21 NOTE — Transfer of Care (Signed)
Immediate Anesthesia Transfer of Care Note  Patient: Mary Cannon  Procedure(s) Performed: Procedure(s) with comments: CESAREAN SECTION (N/A) -  EDC 07/26/15  [name] RNFA confirmed 04/21/15  Patient Location: PACU  Anesthesia Type:Spinal  Level of Consciousness: awake, alert  and oriented  Airway & Oxygen Therapy: Patient Spontanous Breathing  Post-op Assessment: Report given to RN  Post vital signs: Reviewed  Last Vitals:  Filed Vitals:   07/21/15 1134  BP: 109/71  Pulse: 79  Temp: 37.1 C  Resp: 20    Complications: No apparent anesthesia complications

## 2015-07-21 NOTE — Anesthesia Procedure Notes (Signed)
Spinal Patient location during procedure: OR Staffing Anesthesiologist: Rooney Swails Performed by: anesthesiologist  Preanesthetic Checklist Completed: patient identified, site marked, surgical consent, pre-op evaluation, timeout performed, IV checked, risks and benefits discussed and monitors and equipment checked Spinal Block Patient position: sitting Prep: ChloraPrep Patient monitoring: continuous pulse ox, blood pressure and heart rate Approach: midline Location: L3-4 Injection technique: single-shot Needle Needle type: Sprotte  Needle gauge: 24 G Needle length: 9 cm Additional Notes Functioning IV was confirmed and monitors were applied. Sterile prep and drape, including hand hygiene, mask and sterile gloves were used. The patient was positioned and the spine was prepped. The skin was anesthetized with lidocaine.  Free flow of clear CSF was obtained prior to injecting local anesthetic into the CSF.  The spinal needle aspirated freely following injection.  The needle was carefully withdrawn.  The patient tolerated the procedure well. Consent was obtained prior to procedure with all questions answered and concerns addressed. Risks including but not limited to bleeding, infection, nerve damage, paralysis, failed block, inadequate analgesia, allergic reaction, high spinal, itching and headache were discussed and the patient wished to proceed.   Shaniqua Guillot, MD     

## 2015-07-21 NOTE — Op Note (Signed)
Cesarean Section Procedure Note  Pre-operative Diagnosis: IUP at 39 weeks, prev C/S for repeat  Post-operative Diagnosis: same  Surgeon: Luz Lex   Assistants: none  Anesthesia: spinal  Procedure:  Low Segment Transverse cesarean section  Procedure Details  The patient was seen in the Holding Room. The risks, benefits, complications, treatment options, and expected outcomes were discussed with the patient.  The patient concurred with the proposed plan, giving informed consent.  The site of surgery properly noted/marked.. A Time Out was held and the above information confirmed.  After induction of anesthesia, the patient was draped and prepped in the usual sterile manner. A Pfannenstiel incision was made and carried down through the subcutaneous tissue to the fascia. Fascial incision was made and extended transversely. The fascia was separated from the underlying rectus tissue superiorly and inferiorly. The peritoneum was identified and entered. Peritoneal incision was extended longitudinally. The utero-vesical peritoneal reflection was incised transversely and the bladder flap was bluntly freed from the lower uterine segment. A low transverse uterine incision was made. Delivered from vertex presentation was a baby with Apgar scores of 8 at one minute and 9 at five minutes. After the umbilical cord was clamped and cut cord blood was obtained for evaluation. The placenta was removed intact and appeared normal. The uterine outline, tubes and ovaries appeared normal. The uterine incision was closed with running locked sutures of 0 monocryl and imbricated with 0 monocryl. Hemostasis was observed. Lavage was carried out until clear. The peritoneum was then closed with 0 monocryl and rectus muscles plicated in the midline.  After hemostasis was assured, the fascia was then reapproximated with running sutures of #1 PDSl. Irrigation was applied and after adequate hemostasis was assured, the skin was  reapproximated with subcutaneous sutures using 4-0 monocryl.  Instrument, sponge, and needle counts were correct prior the abdominal closure and at the conclusion of the case. The patient received 2 grams cefotetan preoperatively.  Findings: Viable female  Estimated Blood Loss:  700cc         Specimens: Placenta was sent to labor and delivery         Complications:  None

## 2015-07-21 NOTE — Anesthesia Preprocedure Evaluation (Signed)
Anesthesia Evaluation  Patient identified by MRN, date of birth, ID band Patient awake    Reviewed: Allergy & Precautions, NPO status , Patient's Chart, lab work & pertinent test results  History of Anesthesia Complications Negative for: history of anesthetic complications  Airway Mallampati: II  TM Distance: >3 FB Neck ROM: Full    Dental no notable dental hx. (+) Dental Advisory Given   Pulmonary    Pulmonary exam normal breath sounds clear to auscultation       Cardiovascular negative cardio ROS Normal cardiovascular exam Rhythm:Regular Rate:Normal     Neuro/Psych  Headaches, negative psych ROS   GI/Hepatic negative GI ROS, Neg liver ROS,   Endo/Other  obesity  Renal/GU negative Renal ROS  negative genitourinary   Musculoskeletal negative musculoskeletal ROS (+)   Abdominal   Peds negative pediatric ROS (+)  Hematology  (+) anemia ,   Anesthesia Other Findings   Reproductive/Obstetrics (+) Pregnancy                             Anesthesia Physical Anesthesia Plan  ASA: II  Anesthesia Plan: Spinal   Post-op Pain Management:    Induction:   Airway Management Planned:   Additional Equipment:   Intra-op Plan:   Post-operative Plan:   Informed Consent: I have reviewed the patients History and Physical, chart, labs and discussed the procedure including the risks, benefits and alternatives for the proposed anesthesia with the patient or authorized representative who has indicated his/her understanding and acceptance.   Dental advisory given  Plan Discussed with:   Anesthesia Plan Comments:         Anesthesia Quick Evaluation

## 2015-07-21 NOTE — Consult Note (Signed)
Neonatology Note:   Attendance at C-section:    I was asked by Dr. Corinna Capra to attend this repeat C/S at term. The mother is a G2P1 O pos with endometriosis. ROM at delivery, fluid clear. Infant vigorous with good spontaneous cry and tone. Needed only minimal bulb suctioning. Ap 8/9. Lungs clear to ausc in DR. To CN to care of Pediatrician.   Real Cons, MD

## 2015-07-22 ENCOUNTER — Encounter (HOSPITAL_COMMUNITY): Payer: Self-pay | Admitting: Obstetrics and Gynecology

## 2015-07-22 LAB — CBC
HCT: 31.3 % — ABNORMAL LOW (ref 36.0–46.0)
Hemoglobin: 10.5 g/dL — ABNORMAL LOW (ref 12.0–15.0)
MCH: 29.2 pg (ref 26.0–34.0)
MCHC: 33.5 g/dL (ref 30.0–36.0)
MCV: 87.2 fL (ref 78.0–100.0)
PLATELETS: 167 10*3/uL (ref 150–400)
RBC: 3.59 MIL/uL — AB (ref 3.87–5.11)
RDW: 13.5 % (ref 11.5–15.5)
WBC: 13.9 10*3/uL — ABNORMAL HIGH (ref 4.0–10.5)

## 2015-07-22 LAB — BIRTH TISSUE RECOVERY COLLECTION (PLACENTA DONATION)

## 2015-07-22 MED ORDER — MEASLES, MUMPS & RUBELLA VAC ~~LOC~~ INJ
0.5000 mL | INJECTION | Freq: Once | SUBCUTANEOUS | Status: AC
  Administered 2015-07-23: 0.5 mL via SUBCUTANEOUS
  Filled 2015-07-22 (×3): qty 0.5

## 2015-07-22 NOTE — Progress Notes (Signed)
Subjective: Postpartum Day 1: Cesarean Delivery Patient reports tolerating PO and no problems voiding.    Objective: Vital signs in last 24 hours: Temp:  [97.5 F (36.4 C)-98.8 F (37.1 C)] 98.3 F (36.8 C) (10/25 0610) Pulse Rate:  [13-94] 66 (10/25 0610) Resp:  [10-22] 18 (10/25 0610) BP: (91-109)/(54-78) 98/63 mmHg (10/25 0610) SpO2:  [93 %-98 %] 96 % (10/25 0610) Weight:  [195 lb (88.451 kg)] 195 lb (88.451 kg) (10/24 2355)  Physical Exam:  General: alert and cooperative Lochia: appropriate Uterine Fundus: firm Incision: healing well DVT Evaluation: No evidence of DVT seen on physical exam. Negative Homan's sign. No cords or calf tenderness. No significant calf/ankle edema.   Recent Labs  07/22/15 0520  HGB 10.5*  HCT 31.3*    Assessment/Plan: Status post Cesarean section. Doing well postoperatively.  Continue current care.  Amjad Fikes G 07/22/2015, 7:59 AM

## 2015-07-22 NOTE — Lactation Note (Signed)
This note was copied from the chart of Mary Cannon. Lactation Consultation Note  Patient Name: Mary Cannon [id number] Date: 07/22/2015 Reason for consult: Follow-up assessment Partially bf mom. She is unsure baby is eating enough because she can only manually express drops. She is tired from the feedings and fussiness. Went over baby behavior, milk transition, feeding frequency, breast changes, and nipple care. Mom seemed to understand and want to bf more. Discussed pumping, she will wait until she is going to be away before starting with it. She is aware of O/P lactation and support group. She will call as needed for bf help.     Maternal Data    Feeding Feeding Type: Breast Fed Length of feed: 10 min  LATCH Score/Interventions Latch: Grasps breast easily, tongue down, lips flanged, rhythmical sucking. Intervention(s): Adjust position;Assist with latch  Audible Swallowing: Spontaneous and intermittent Intervention(s): Alternate breast massage  Type of Nipple: Everted at rest and after stimulation  Comfort (Breast/Nipple): Soft / non-tender     Hold (Positioning): Assistance needed to correctly position infant at breast and maintain latch. Intervention(s): Support Pillows;Position options  LATCH Score: 9  Lactation Tools Discussed/Used     Consult Status Consult Status: Follow-up Date: 07/23/15 Follow-up type: In-patient    Denzil Hughes 07/22/2015, 10:17 PM

## 2015-07-22 NOTE — Addendum Note (Signed)
Addendum  created 07/22/15 1104 by Elenore Paddy, CRNA   Modules edited: Charges VN

## 2015-07-22 NOTE — Anesthesia Postprocedure Evaluation (Signed)
  Anesthesia Post-op Note  Patient: Mary Cannon  Procedure(s) Performed: Procedure(s) with comments: CESAREAN SECTION (N/A) -  EDC 07/26/15  [name] RNFA confirmed 04/21/15  Patient Location: PACU and Mother/Baby  Anesthesia Type:Spinal  Level of Consciousness: awake, alert  and oriented  Airway and Oxygen Therapy: Patient Spontanous Breathing  Post-op Pain: mild  Post-op Assessment: Patient's Cardiovascular Status Stable, Respiratory Function Stable, No signs of Nausea or vomiting, Adequate PO intake, Pain level controlled, No headache and No backache              Post-op Vital Signs: Reviewed and stable  Last Vitals:  Filed Vitals:   07/22/15 0610  BP: 98/63  Pulse: 66  Temp: 36.8 C  Resp: 18    Complications: No apparent anesthesia complications

## 2015-07-22 NOTE — Addendum Note (Signed)
Addendum  created 07/22/15 1103 by Elenore Paddy, CRNA   Modules edited: Charges VN, Notes Section   Notes Section:  File: [id number]

## 2015-07-22 NOTE — Plan of Care (Signed)
Problem: Discharge Progression Outcomes Goal: MMR given as ordered Outcome: Not Progressing Needs MMR     

## 2015-07-23 MED ORDER — OXYCODONE-ACETAMINOPHEN 5-325 MG PO TABS: 1.0000 | ORAL_TABLET | ORAL | Status: DC | PRN

## 2015-07-23 MED ORDER — IBUPROFEN 600 MG PO TABS: 600.0000 mg | ORAL_TABLET | Freq: Four times a day (QID) | ORAL | Status: DC

## 2015-07-23 NOTE — Discharge Summary (Signed)
Obstetric Discharge Summary Reason for Admission: cesarean section Prenatal Procedures: ultrasound Intrapartum Procedures: cesarean: low cervical, transverse Postpartum Procedures: none Complications-Operative and Postpartum: none HEMOGLOBIN  Date Value Ref Range Status  07/22/2015 10.5* 12.0 - 15.0 g/dL Final   HCT  Date Value Ref Range Status  07/22/2015 31.3* 36.0 - 46.0 % Final    Physical Exam:  General: alert and cooperative Lochia: appropriate Uterine Fundus: firm Incision: healing well DVT Evaluation: No evidence of DVT seen on physical exam. Negative Homan's sign. No cords or calf tenderness. No significant calf/ankle edema.  Discharge Diagnoses: Term Pregnancy-delivered  Discharge Information: Date: 07/23/2015 Activity: pelvic rest Diet: routine Medications: PNV, Ibuprofen and Percocet Condition: stable Instructions: refer to practice specific booklet Discharge to: home   Newborn Data: Live born female  Birth Weight: 7 lb 9.3 oz (3440 g) APGAR: 8, 9  Home with mother.  CURTIS,CAROL G 07/23/2015, 8:22 AM

## 2016-06-28 ENCOUNTER — Encounter (HOSPITAL_COMMUNITY): Payer: Self-pay | Admitting: Obstetrics and Gynecology

## 2016-07-17 IMAGING — US US OB COMP +14 WK
1 series · 12 of 28 positions shown · non-contrast
Comparison: none

[Series 1: us ob +14 all · 87 acquisitions, 12 frames shown]
[im 4/87]
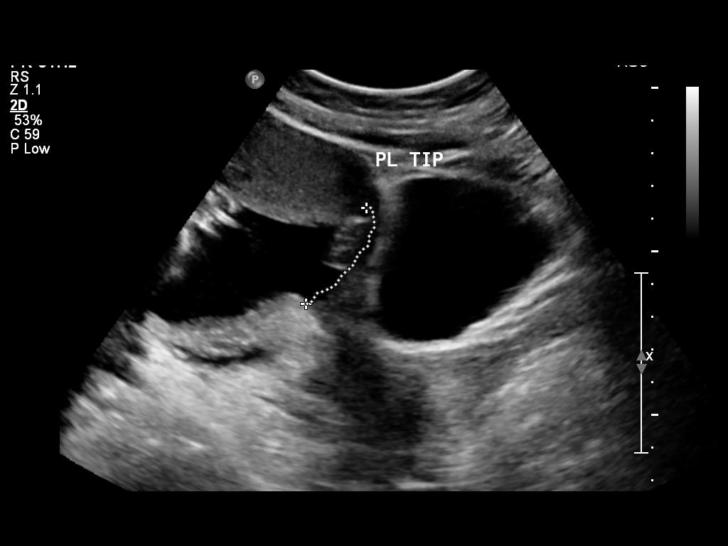
[im 10/87]
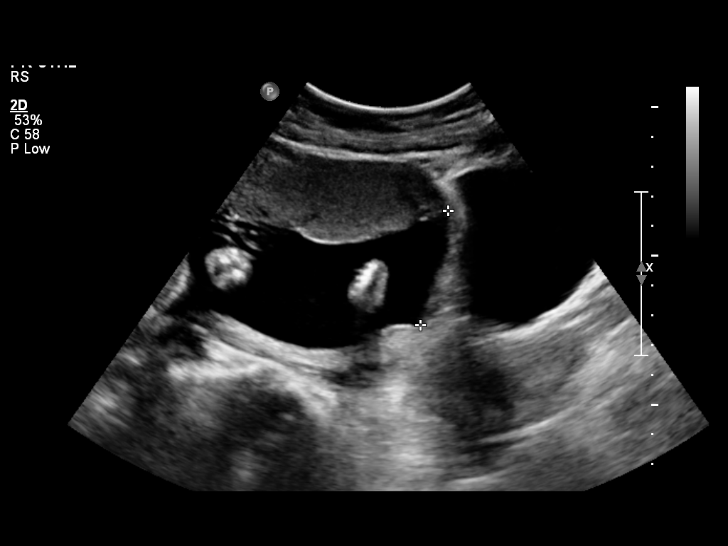
[im 16/87]
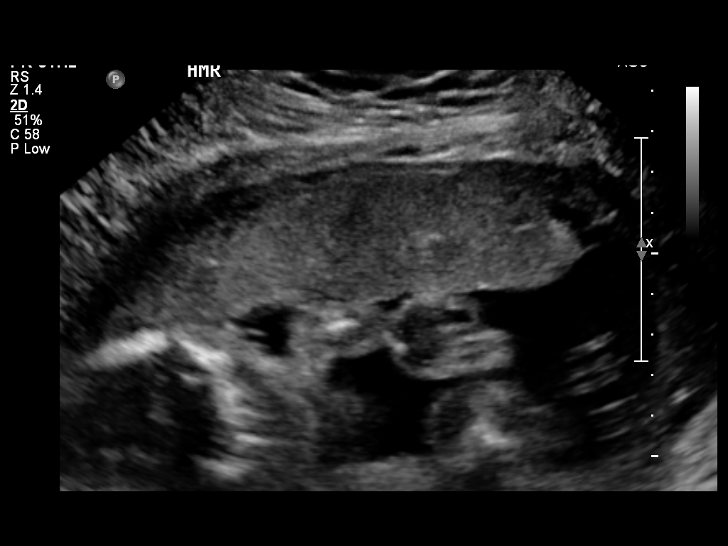
[im 26/87]
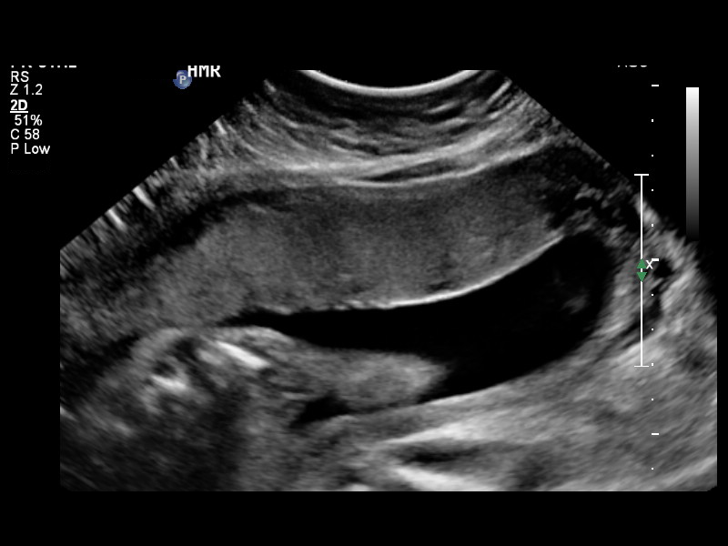
[im 32/87]
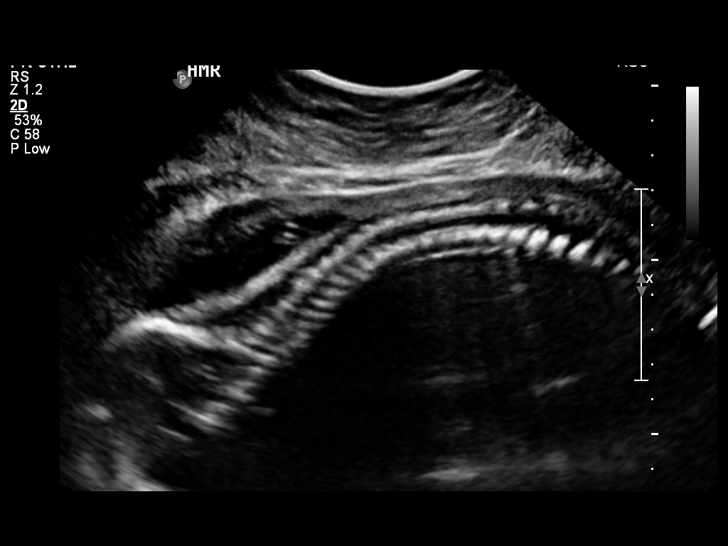
[im 39/87]
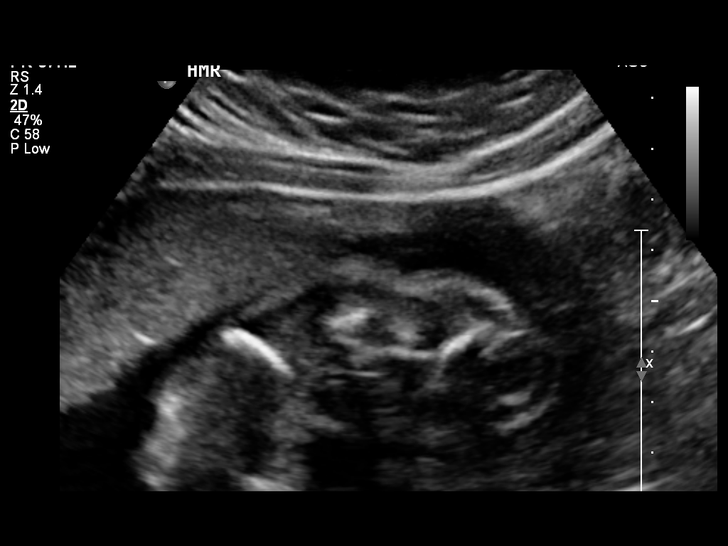
[im 48/87]
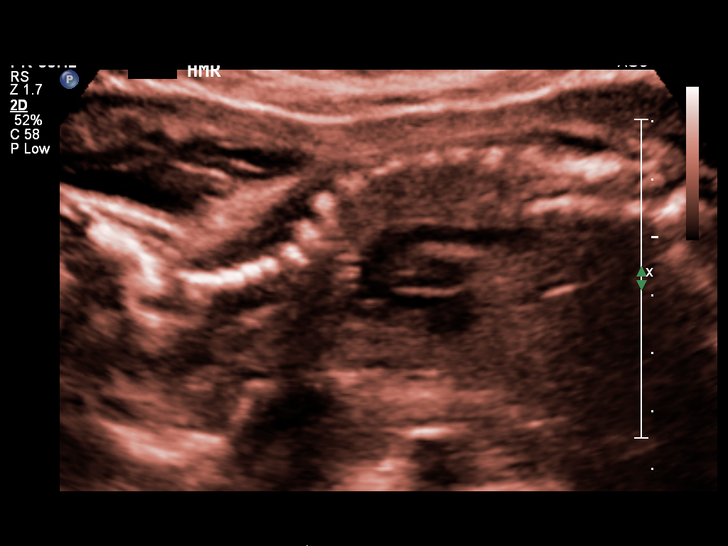
[im 55/87]
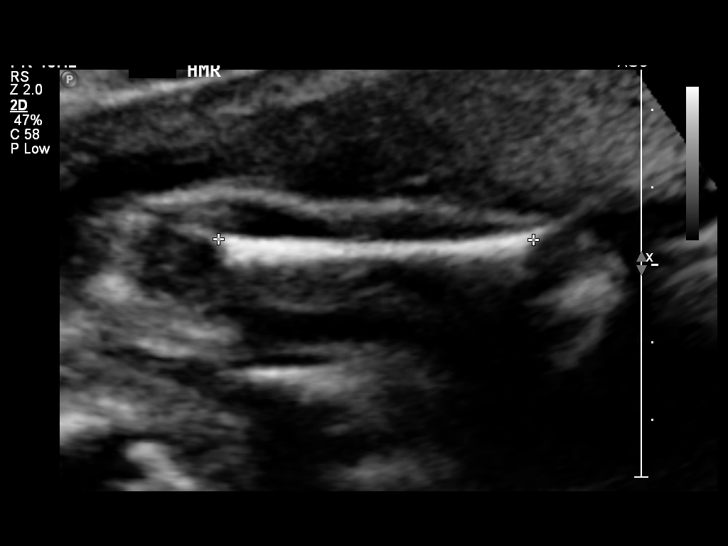
[im 61/87]
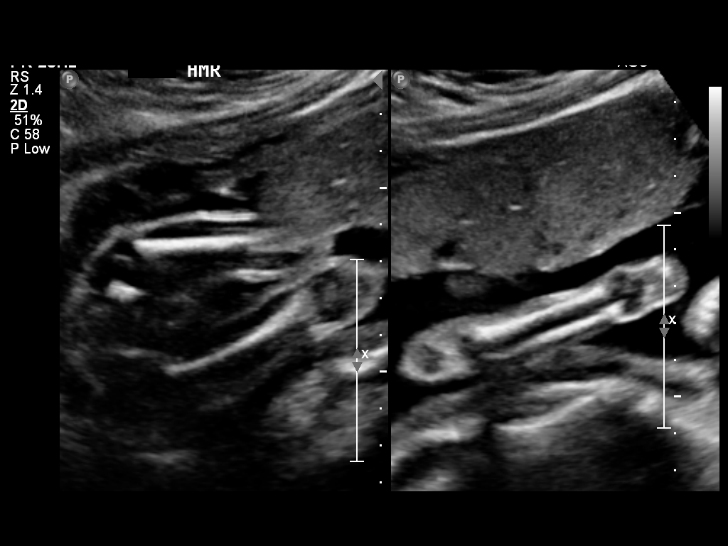
[im 71/87]
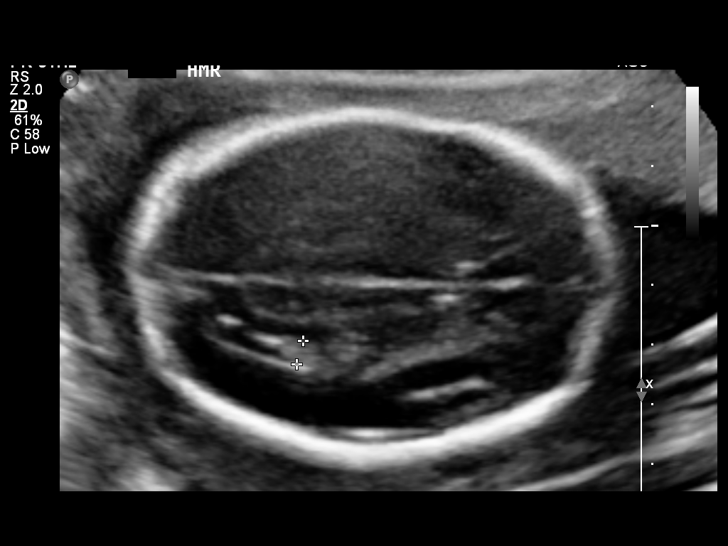
[im 77/87]
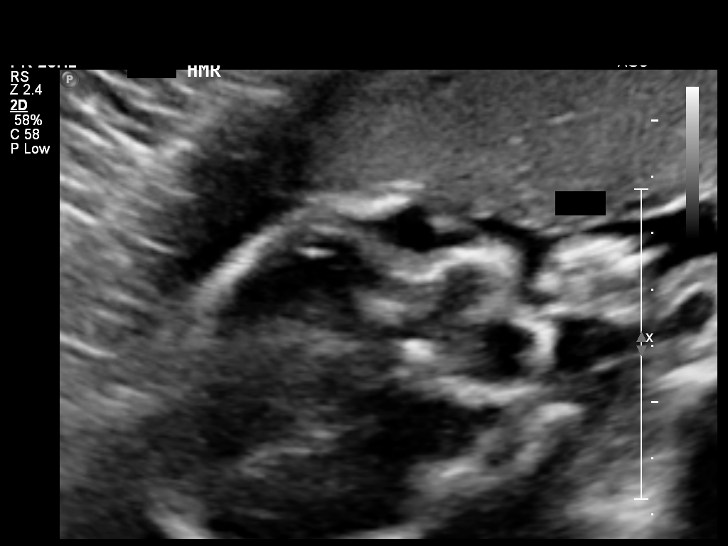
[im 83/87]
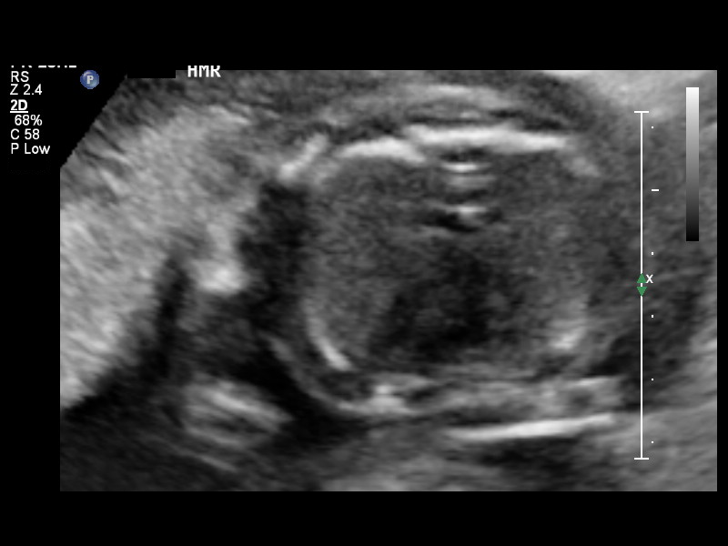

[12 of 28 positions shown; findings below may reference images not displayed]

OBSTETRICS REPORT
(Signed Final 04/10/2015 [DATE])

Service(s) Provided

US OB COMP + 14 WK                                    76805.1
Indications

24 weeks gestation of pregnancy
Decreased fetal movement
Abdominal pain in pregnancy
Poor obstetric history: Previous preterm delivery
(@ 27 weeks due to abruption)
Previous cesarean delivery
Basic anatomic survey                                 [id number]
Fetal Evaluation

Num Of Fetuses:    1
Fetal Heart Rate:  140                          bpm
Cardiac Activity:  Observed
Presentation:      Transverse, head to
maternal right
Placenta:          Anterior, above cervical os
P. Cord            Visualized, central
Insertion:

Amniotic Fluid
AFI FV:      Subjectively within normal limits
Larg Pckt:    3.66  cm
Biometry

BPD:     56.2  mm     G. Age:  23w 1d                CI:        62.15   70 - 86
FL/HC:      19.2   18.7 -
20.3
HC:     230.2  mm     G. Age:  25w 0d       43  %    HC/AC:      1.19   1.04 -
1.22
AC:     194.1  mm     G. Age:  24w 1d       24  %    FL/BPD:     78.8   71 - 87
FL:      44.3  mm     G. Age:  24w 4d       33  %    FL/AC:      22.8   20 - 24
HUM:     40.9  mm     G. Age:  24w 6d       44  %
CER:     28.8  mm     G. Age:  25w 6d       69  %

Est. FW:     684  gm      1 lb 8 oz     44  %
Gestational Age

LMP:           24w 5d        Date:  10/19/14                 EDD:   07/26/15
U/S Today:     24w 1d                                        EDD:   07/30/15
Best:          24w 5d     Det. By:  LMP  (10/19/14)          EDD:   07/26/15
Anatomy

Cranium:          Appears normal         Aortic Arch:      Appears normal
Fetal Cavum:      Appears normal         Ductal Arch:      Not well visualized
Ventricles:       Appears normal         Diaphragm:        Appears normal
Choroid Plexus:   Not well visualized    Stomach:          Appears normal, left
sided
Cerebellum:       Appears normal         Abdomen:          Appears normal
Posterior Fossa:  Appears normal         Abdominal Wall:   Not well visualized
Nuchal Fold:      Not applicable (>20    Cord Vessels:     Appears normal (3
wks GA)                                  vessel cord)
Face:             Orbits appear          Kidneys:          Appear normal
normal
Lips:             Appears normal         Bladder:          Appears normal
Heart:            Not well visualized    Spine:            Appears normal
RVOT:             Not well visualized    Lower             Appears normal
Extremities:
LVOT:             Not well visualized    Upper             Appears normal
Extremities:

Other:  Fetus appears to be a female. Technically difficult due to fetal position.
Cervix Uterus Adnexa

Cervical Length:    4.37     cm

Cervix:       Normal appearance by transabdominal scan.
Uterus:       No abnormality visualized.
Left Ovary:    Not visualized.
Right Ovary:   Within normal limits.
Adnexa:     No abnormality visualized. No adnexal mass visualized.
Impression

Single IUP at 24w 5d
Limited views of the fetal heart, abdominal cord insertion
obtained
The estimated fetal weight is at the 44th %tile.
Anterior placenta without previa
Cervical length 4.3 cm (transabdominal)
Normal amniotic fluid volume
Recommendations

Recommend follow-up ultrasound examination in 4 weeks to
cmoplete anatomy

## 2016-11-18 LAB — OB RESULTS CONSOLE ANTIBODY SCREEN: ANTIBODY SCREEN: NEGATIVE

## 2016-11-18 LAB — OB RESULTS CONSOLE HIV ANTIBODY (ROUTINE TESTING): HIV: NONREACTIVE

## 2016-11-18 LAB — OB RESULTS CONSOLE HEPATITIS B SURFACE ANTIGEN: HEP B S AG: NEGATIVE

## 2016-11-18 LAB — OB RESULTS CONSOLE GC/CHLAMYDIA
CHLAMYDIA, DNA PROBE: NEGATIVE
Gonorrhea: NEGATIVE

## 2016-11-18 LAB — OB RESULTS CONSOLE ABO/RH: RH Type: POSITIVE

## 2016-11-18 LAB — OB RESULTS CONSOLE RUBELLA ANTIBODY, IGM: RUBELLA: IMMUNE

## 2016-11-18 LAB — OB RESULTS CONSOLE RPR: RPR: NONREACTIVE

## 2017-01-13 ENCOUNTER — Inpatient Hospital Stay (HOSPITAL_COMMUNITY)
Admission: AD | Admit: 2017-01-13 | Discharge: 2017-01-13 | Disposition: A | Discharge status: Home / Self Care | Payer: Medicaid Other | Source: Ambulatory Visit | Attending: Obstetrics & Gynecology | Admitting: Obstetrics & Gynecology

## 2017-01-13 ENCOUNTER — Encounter (HOSPITAL_COMMUNITY): Payer: Self-pay | Admitting: *Deleted

## 2017-01-13 DIAGNOSIS — O26892 Other specified pregnancy related conditions, second trimester: Secondary | ICD-10-CM | POA: Diagnosis not present

## 2017-01-13 DIAGNOSIS — R109 Unspecified abdominal pain: Secondary | ICD-10-CM

## 2017-01-13 DIAGNOSIS — Z3A16 16 weeks gestation of pregnancy: Secondary | ICD-10-CM | POA: Insufficient documentation

## 2017-01-13 DIAGNOSIS — O219 Vomiting of pregnancy, unspecified: Secondary | ICD-10-CM | POA: Diagnosis not present

## 2017-01-13 DIAGNOSIS — Z882 Allergy status to sulfonamides status: Secondary | ICD-10-CM | POA: Insufficient documentation

## 2017-01-13 DIAGNOSIS — Z888 Allergy status to other drugs, medicaments and biological substances status: Secondary | ICD-10-CM | POA: Insufficient documentation

## 2017-01-13 DIAGNOSIS — O26899 Other specified pregnancy related conditions, unspecified trimester: Secondary | ICD-10-CM | POA: Diagnosis not present

## 2017-01-13 DIAGNOSIS — Z87440 Personal history of urinary (tract) infections: Secondary | ICD-10-CM | POA: Diagnosis not present

## 2017-01-13 LAB — URINALYSIS, ROUTINE W REFLEX MICROSCOPIC
BILIRUBIN URINE: NEGATIVE
Glucose, UA: NEGATIVE mg/dL
Hgb urine dipstick: NEGATIVE
Ketones, ur: NEGATIVE mg/dL
Nitrite: NEGATIVE
PH: 7 (ref 5.0–8.0)
Protein, ur: NEGATIVE mg/dL
SPECIFIC GRAVITY, URINE: 1.021 (ref 1.005–1.030)

## 2017-01-13 LAB — WET PREP, GENITAL
Clue Cells Wet Prep HPF POC: NONE SEEN
SPERM: NONE SEEN
Trich, Wet Prep: NONE SEEN
YEAST WET PREP: NONE SEEN

## 2017-01-13 NOTE — MAU Note (Addendum)
RN to the bedside, discharge instructions reviewed with patient, patient verbalized understanding, e-signature obtained, copy of discharge instructions given.  Pt. Discharged with family member at the bedside.

## 2017-01-13 NOTE — Discharge Instructions (Signed)
Abdominal Pain During Pregnancy °Belly (abdominal) pain is common during pregnancy. Most of the time, it is not a serious problem. Other times, it can be a sign that something is wrong with the pregnancy. Always tell your doctor if you have belly pain. °Follow these instructions at home: °Monitor your belly pain for any changes. The following actions may help you feel better: °· Do not have sex (intercourse) or put anything in your vagina until you feel better. °· Rest until your pain stops. °· Drink clear fluids if you feel sick to your stomach (nauseous). Do not eat solid food until you feel better. °· Only take medicine as told by your doctor. °· Keep all doctor visits as told. °Get help right away if: °· You are bleeding, leaking fluid, or pieces of tissue come out of your vagina. °· You have more pain or cramping. °· You keep throwing up (vomiting). °· You have pain when you pee (urinate) or have blood in your pee. °· You have a fever. °· You do not feel your baby moving as much. °· You feel very weak or feel like passing out. °· You have trouble breathing, with or without belly pain. °· You have a very bad headache and belly pain. °· You have fluid leaking from your vagina and belly pain. °· You keep having watery poop (diarrhea). °· Your belly pain does not go away after resting, or the pain gets worse. °This information is not intended to replace advice given to you by your health care provider. Make sure you discuss any questions you have with your health care provider. °Document Released: 09/01/2009 Document Revised: 04/21/2016 Document Reviewed: 04/12/2013 °[employer]vier Interactive Patient Education © 2017 [employer] ° °

## 2017-01-13 NOTE — MAU Note (Signed)
Pt reports she began having lower abd cramping this afternoon, this pm the pt reports she started having chills and vomited x one. Denies bleeding.

## 2017-01-13 NOTE — MAU Provider Note (Signed)
OB/GYN Attending MAU Note  Chief Complaint: Abdominal Pain and Nausea  First Provider Initiated Contact with Patient 01/13/17 2121     SUBJECTIVE Mary SHERPA is a 31 y.o. [id number] at [redacted]w[redacted]d who gets prenatal care at [facility], here for evaluation of lower abdominal cramping, one episode of nausea and vomiting and chills today.  Reports history of UTI, and is concerned about this.  Denies dysuria, fevers, sweats, suprapubic pain, diarrhea,abnormal vaginal discharge, vaginal bleeding, other GI or GU symptoms or other general symptoms. No sick contacts, no ingestion of any concerning food or drink.   Past Medical History:  Diagnosis Date  . Abdominal wall mass   . Anemia   . Endometriosis   . Headache    Migraines  . Ovarian cyst   . Seasonal allergies   . Shortness of breath dyspnea    with pregnancy   OB History  Gravida Para Term Preterm AB Living  4 3 1 2  0 2  SAB TAB Ectopic Multiple Live Births  0 0 0   3    # Outcome Date GA Lbr Len/2nd Weight Sex Delivery Anes PTL Lv  4 Current           3 Term 07/21/15 [redacted]w[redacted]d  7 lb 9.3 oz (3.44 kg) F CS-LVertical Spinal  LIV  2 Preterm 07/17/11 [redacted]w[redacted]d  2 lb 0.1 oz (0.91 kg) M CS-LTranv Spinal  LIV  1 Preterm  [redacted]w[redacted]d    CS-LTranv   ND     Complications: Abruptio Placenta    Obstetric Comments  1st pregnancy delivered at 27 weeks, partial abruption, baby died within 24 hours of delivery.   Past Surgical History:  Procedure Laterality Date  . CESAREAN SECTION  07/17/2011   Procedure: CESAREAN SECTION;  Surgeon: Luz Lex, MD;  Location: [facility];  Service: Gynecology;  Laterality: N/A;  . CESAREAN SECTION    . CESAREAN SECTION N/A 07/21/2015   Procedure: CESAREAN SECTION;  Surgeon: Louretta Shorten, MD;  Location: [facility];  Service: Obstetrics;  Laterality: N/A;   EDC 07/26/15  [name] RNFA confirmed 04/21/15  . UMBILICAL HERNIA REPAIR N/A 03/05/2014   Procedure: open abdominal wall exploration with removal of mass possible mesh  repair ;  Surgeon: [name], MD;  Location: [facility];  Service: General;  Laterality: N/A;   Social History   Social History  . Marital status: Married    Spouse name: N/A  . Number of children: N/A  . Years of education: N/A   Occupational History  . Not on file.   Social History Main Topics  . Smoking status: Never Smoker  . Smokeless tobacco: Never Used  . Alcohol use Yes     Comment: social- not with preg  . Drug use: No  . Sexual activity: Yes   Other Topics Concern  . Not on file   Social History Narrative   ** Merged History Encounter **       Current Facility-Administered Medications on File Prior to Encounter  Medication Dose Route Frequency Provider Last Rate Last Dose  . lactated ringers infusion   Intravenous Once        Current Outpatient Prescriptions on File Prior to Encounter  Medication Sig Dispense Refill  . ibuprofen (ADVIL,MOTRIN) 600 MG tablet Take 1 tablet (600 mg total) by mouth every 6 (six) hours. 30 tablet 1  . oxyCODONE-acetaminophen (PERCOCET/ROXICET) 5-325 MG tablet Take 1 tablet by mouth every 4 (four) hours as needed (for pain  scale 4-7). 30 tablet 0  . Prenatal Vit-Fe Fumarate-FA (PRENATAL MULTIVITAMIN) TABS tablet Take 1 tablet by mouth daily at 12 noon.    . prenatal vitamin w/FE, FA (PRENATAL 1 + 1) 27-1 MG TABS Take 1 tablet by mouth daily.       Allergies  Allergen Reactions  . Azo [Phenazopyridine] Shortness Of Breath and Nausea And Vomiting    Elevated heart rate  . Sulfa Antibiotics Other (See Comments)    Gets bad yeast infection    ROS: Pertinent items in HPI  OBJECTIVE BP 100/66 (BP Location: Left Arm)   Pulse 95   Temp 99.3 F (37.4 C) (Oral)   Resp 17   Ht 5\' 4"  (1.626 m)   Wt 172 lb (78 kg)   SpO2 99%   BMI 29.52 kg/m  CONSTITUTIONAL: Well-developed, well-nourished female in no acute distress.  HENT:  Normocephalic, atraumatic, External right and left ear normal. Oropharynx is clear and moist EYES:  Conjunctivae and EOM are normal. Pupils are equal, round, and reactive to light. No scleral icterus.  NECK: Normal range of motion, supple, no masses.  Normal thyroid.  SKIN: Skin is warm and dry. No rash noted. Not diaphoretic. No erythema. No pallor. [place]: Alert and oriented to person, place, and time. Normal reflexes, muscle tone coordination. No cranial nerve deficit noted. PSYCHIATRIC: Normal mood and affect. Normal behavior. Normal judgment and thought content. CARDIOVASCULAR: Normal heart rate noted RESPIRATORY: Effort and breath sounds normal, no problems with respiration noted. ABDOMEN: Soft, normal bowel sounds, no distention noted.  No tenderness, rebound or guarding.  BACK: No CVAT PELVIC: Normal appearing external genitalia; normal appearing vagina.  White discharge, wet prep and pelvic cultures obtained.  16 week uterus, closed/thick/high cervix.  No other palpable masses, no uterine or adnexal tenderness. MUSCULOSKELETAL: Normal range of motion. No tenderness.  No cyanosis, clubbing, or edema.  2+ distal pulses.  LAB RESULTS Results for orders placed or performed during the hospital encounter of 01/13/17 (from the past 48 hour(s))  Urinalysis, Routine w reflex microscopic     Status: Abnormal   Collection Time: 01/13/17  9:16 PM  Result Value Ref Range   Color, Urine YELLOW YELLOW   APPearance HAZY (A) CLEAR   Specific Gravity, Urine 1.021 1.005 - 1.030   pH 7.0 5.0 - 8.0   Glucose, UA NEGATIVE NEGATIVE mg/dL   Hgb urine dipstick NEGATIVE NEGATIVE   Bilirubin Urine NEGATIVE NEGATIVE   Ketones, ur NEGATIVE NEGATIVE mg/dL   Protein, ur NEGATIVE NEGATIVE mg/dL   Nitrite NEGATIVE NEGATIVE   Leukocytes, UA LARGE (A) NEGATIVE   RBC / HPF 0-5 0 - 5 RBC/hpf   WBC, UA 6-30 0 - 5 WBC/hpf   Bacteria, UA RARE (A) NONE SEEN   Squamous Epithelial / LPF 6-30 (A) NONE SEEN   Mucous PRESENT   Wet prep, genital     Status: Abnormal   Collection Time: 01/13/17  9:30 PM  Result  Value Ref Range   Yeast Wet Prep HPF POC NONE SEEN NONE SEEN   Trich, Wet Prep NONE SEEN NONE SEEN   Clue Cells Wet Prep HPF POC NONE SEEN NONE SEEN   WBC, Wet Prep HPF POC MANY (A) NONE SEEN    Comment: FEW BACTERIA SEEN   Sperm NONE SEEN     MAU COURSE Bedside ultrasound showed 16 week sized viable IUP, normal AFV.  Patient reassured.  Also reassured about cervical exam, especially given her history of PTD. UA equivocal with  large LE, rare bacteria; urine culture sent.  No treatment needed currently. Wet prep negative.  Patient reassured.    ASSESSMENT 1. Abdominal pain in pregnancy, antepartum   2. Nausea and vomiting in pregnancy     PLAN Possible mild gastroenteritis Patient told to come back/go to office for worsening or concerning symptoms Follow up with [facility] provider as needed.  Discharge home Follow-up Information    [facility] Follow up.   Why:  For prenatal appointment as scheduled Contact information: [place] [address] [phone]          Allergies as of 01/13/2017      Reactions   Azo [phenazopyridine] Shortness Of Breath, Nausea And Vomiting   Elevated heart rate   Sulfa Antibiotics Other (See Comments)   Gets bad yeast infection      Medication List    STOP taking these medications   ibuprofen 600 MG tablet Commonly known as:  ADVIL,MOTRIN   oxyCODONE-acetaminophen 5-325 MG tablet Commonly known as:  PERCOCET/ROXICET   prenatal multivitamin Tabs tablet     TAKE these medications   prenatal vitamin w/FE, FA 27-1 MG Tabs tablet Take 1 tablet by mouth daily.        Osborne Oman, MD 01/13/2017 10:37 PM

## 2017-01-14 LAB — GC/CHLAMYDIA PROBE AMP (~~LOC~~) NOT AT ARMC
Chlamydia: NEGATIVE
Neisseria Gonorrhea: NEGATIVE

## 2017-03-24 ENCOUNTER — Encounter (HOSPITAL_COMMUNITY): Payer: Self-pay | Admitting: *Deleted

## 2017-03-24 ENCOUNTER — Inpatient Hospital Stay (HOSPITAL_COMMUNITY)
Admission: AD | Admit: 2017-03-24 | Discharge: 2017-03-24 | Disposition: A | Discharge status: Home / Self Care | Payer: Medicaid Other | Source: Ambulatory Visit | Attending: Obstetrics and Gynecology | Admitting: Obstetrics and Gynecology

## 2017-03-24 DIAGNOSIS — Z3A26 26 weeks gestation of pregnancy: Secondary | ICD-10-CM | POA: Diagnosis not present

## 2017-03-24 DIAGNOSIS — R1031 Right lower quadrant pain: Secondary | ICD-10-CM | POA: Diagnosis present

## 2017-03-24 DIAGNOSIS — N83201 Unspecified ovarian cyst, right side: Secondary | ICD-10-CM | POA: Diagnosis not present

## 2017-03-24 DIAGNOSIS — O99012 Anemia complicating pregnancy, second trimester: Secondary | ICD-10-CM | POA: Insufficient documentation

## 2017-03-24 DIAGNOSIS — O3482 Maternal care for other abnormalities of pelvic organs, second trimester: Secondary | ICD-10-CM | POA: Diagnosis not present

## 2017-03-24 DIAGNOSIS — O26892 Other specified pregnancy related conditions, second trimester: Secondary | ICD-10-CM | POA: Insufficient documentation

## 2017-03-24 DIAGNOSIS — O479 False labor, unspecified: Secondary | ICD-10-CM

## 2017-03-24 DIAGNOSIS — O09212 Supervision of pregnancy with history of pre-term labor, second trimester: Secondary | ICD-10-CM | POA: Diagnosis present

## 2017-03-24 DIAGNOSIS — O4700 False labor before 37 completed weeks of gestation, unspecified trimester: Secondary | ICD-10-CM

## 2017-03-24 DIAGNOSIS — R1013 Epigastric pain: Secondary | ICD-10-CM | POA: Insufficient documentation

## 2017-03-24 DIAGNOSIS — O99512 Diseases of the respiratory system complicating pregnancy, second trimester: Secondary | ICD-10-CM | POA: Insufficient documentation

## 2017-03-24 DIAGNOSIS — R0602 Shortness of breath: Secondary | ICD-10-CM | POA: Insufficient documentation

## 2017-03-24 LAB — URINALYSIS, ROUTINE W REFLEX MICROSCOPIC
Bilirubin Urine: NEGATIVE
GLUCOSE, UA: NEGATIVE mg/dL
Hgb urine dipstick: NEGATIVE
KETONES UR: NEGATIVE mg/dL
Nitrite: NEGATIVE
PH: 6 (ref 5.0–8.0)
Protein, ur: NEGATIVE mg/dL
SPECIFIC GRAVITY, URINE: 1.009 (ref 1.005–1.030)

## 2017-03-24 MED ORDER — NIFEDIPINE 10 MG PO CAPS
10.0000 mg | ORAL_CAPSULE | ORAL | Status: DC | PRN
  Administered 2017-03-24: 10 mg via ORAL
  Filled 2017-03-24: qty 1

## 2017-03-24 NOTE — Discharge Instructions (Signed)
Preventing Preterm Birth °Preterm birth is when your baby is delivered between 20 weeks and 37 weeks of pregnancy. A full-term pregnancy lasts for at least 37 weeks. Preterm birth can be dangerous for your baby because the last few weeks of pregnancy are an important time for your baby's brain and lungs to grow. Many things can cause a baby to be born early. Sometimes the cause is not known. There are certain factors that make you more likely to experience preterm birth, such as: °· Having a previous baby born preterm. °· Being pregnant with twins or other multiples. °· Having had fertility treatment. °· Being overweight or underweight at the start of your pregnancy. °· Having any of the following during pregnancy: °? An infection, including a urinary tract infection (UTI) or an STI (sexually transmitted infection). °? High blood pressure. °? Diabetes. °? Vaginal bleeding. °· Being age 35 or older. °· Being age 18 or younger. °· Getting pregnant within 6 months of a previous pregnancy. °· Suffering extreme stress or physical or emotional abuse during pregnancy. °· Standing for long periods of time during pregnancy, such as working at a job that requires standing. ° °What are the risks? °The most serious risk of preterm birth is that the baby may not survive. This is more likely to happen if a baby is born before 34 weeks. Other risks and complications of preterm birth may include your baby having: °· Breathing problems. °· Brain damage that affects movement and coordination (cerebral palsy). °· Feeding difficulties. °· Vision or hearing problems. °· Infections or inflammation of the digestive tract (colitis). °· Developmental delays. °· Learning disabilities. °· Higher risk for diabetes, heart disease, and high blood pressure later in life. ° °What can I do to lower my risk? °Medical care ° °The most important thing you can do to lower your risk for preterm birth is to get routine medical care during pregnancy  (prenatal care). If you have a high risk of preterm birth, you may be referred to a health care provider who specializes in managing high-risk pregnancies (perinatologist). You may be given medicine to help prevent preterm birth. °Lifestyle changes °Certain lifestyle changes can also lower your risk of preterm birth: °· Wait at least 6 months after a pregnancy to become pregnant again. °· Try to plan pregnancy for when you are between 19 and 35 years old. °· Get to a healthy weight before getting pregnant. If you are overweight, work with your health care provider to safely lose weight. °· Do not use any products that contain nicotine or tobacco, such as cigarettes and e-cigarettes. If you need help quitting, ask your health care provider. °· Do not drink alcohol. °· Do not use drugs. ° °Where to find support: °For more support, consider: °· Talking with your health care provider. °· Talking with a therapist or substance abuse counselor, if you need help quitting. °· Working with a diet and nutrition specialist (dietitian) or a personal trainer to maintain a healthy weight. °· Joining a support group. ° °Where to find more information: °Learn more about preventing preterm birth from: °· Centers for Disease Control and Prevention: cdc.gov/reproductivehealth/maternalinfanthealth/pretermbirth.htm °· March of Dimes: marchofdimes.org/complications/premature-babies.aspx °· American Pregnancy Association: americanpregnancy.org/labor-and-birth/premature-labor ° °Contact a health care provider if: °· You have any of the following signs of preterm labor before 37 weeks: °? A change or increase in vaginal discharge. °? Fluid leaking from your vagina. °? Pressure or cramps in your lower abdomen. °? A backache that does not   go away or gets worse. °? Regular tightening (contractions) in your lower abdomen. °Summary °· Preterm birth means having your baby during weeks 20-37 of pregnancy. °· Preterm birth may put your baby at risk  for physical and mental problems. °· Getting good prenatal care can help prevent preterm birth. °· You can lower your risk of preterm birth by making certain lifestyle changes, such as not smoking and not using alcohol. °This information is not intended to replace advice given to you by your health care provider. Make sure you discuss any questions you have with your health care provider. °Document Released: 10/28/2015 Document Revised: 05/22/2016 Document Reviewed: 05/22/2016 °Elsevier Interactive Patient Education © 2018 Elsevier Inc. ° °

## 2017-03-24 NOTE — MAU Note (Signed)
Pt reports to MAU c/o ctxs and pain that started around 5pm. Pt denies LOF and bleeding. Pt states baby is a little less active than normal but has still been feeling movement.

## 2017-03-24 NOTE — MAU Note (Signed)
Urine in lab 

## 2017-03-24 NOTE — MAU Provider Note (Signed)
Patient Mary Cannon is a 31 y.o. G3P1102 at [redacted]w[redacted]d here with complaints of contractions that started this aftenroon at 4 pm. She had intercourse at 12 pm today.   Her history is significant for abruption and stat-c section with her first child, her second child was a term c-section.  She denies bleeding, leaking of fluid, abnormal discharge.   She reports feeling more anxious in the past two days and feeling like she needed to "get everything done". When her contractions started, she decided to come in for evaluation.  History     CSN: [id number]  Arrival date and time: 03/24/17 1900   First Provider Initiated Contact with Patient 03/24/17 1950      Chief Complaint  Patient presents with  . Contractions   Abdominal Pain  This is a new problem. The current episode started today. The pain is located in the epigastric region and RLQ. Pain scale: contractions are a 6/10 and pressure is a 7/10. The quality of the pain is cramping. The abdominal pain radiates to the RLQ. Pertinent negatives include no diarrhea, dysuria, nausea or vomiting. Nothing aggravates the pain. The pain is relieved by urination.    OB History    Gravida Para Term Preterm AB Living   3 2 1 1  0 2   SAB TAB Ectopic Multiple Live Births   0 0 0   2      Obstetric Comments   1st pregnancy delivered at 27 weeks, partial abruption, baby died within 24 hours of delivery.      Past Medical History:  Diagnosis Date  . Abdominal wall mass   . Anemia   . Endometriosis   . Headache    Migraines  . Ovarian cyst   . Seasonal allergies   . Shortness of breath dyspnea    with pregnancy    Past Surgical History:  Procedure Laterality Date  . CESAREAN SECTION  07/17/2011   Procedure: CESAREAN SECTION;  Surgeon: Luz Lex, MD;  Location: [facility];  Service: Gynecology;  Laterality: N/A;  . CESAREAN SECTION    . CESAREAN SECTION N/A 07/21/2015   Procedure: CESAREAN SECTION;  Surgeon: Louretta Shorten, MD;  Location: [facility];  Service: Obstetrics;  Laterality: N/A;   EDC 07/26/15  [name] RNFA confirmed 04/21/15  . UMBILICAL HERNIA REPAIR N/A 03/05/2014   Procedure: open abdominal wall exploration with removal of mass possible mesh repair ;  Surgeon: [name], MD;  Location: [facility];  Service: General;  Laterality: N/A;    Family History  Problem Relation Age of Onset  . Miscarriages / [place] Mother   . Arthritis Maternal Grandmother   . COPD Maternal Grandmother   . Heart disease Maternal Grandmother   . Hyperlipidemia Maternal Grandmother   . Hypertension Maternal Grandmother   . Diabetes Paternal Grandmother     Social History  Substance Use Topics  . Smoking status: Never Smoker  . Smokeless tobacco: Never Used  . Alcohol use Yes     Comment: social- not with preg    Allergies:  Allergies  Allergen Reactions  . Azo [Phenazopyridine] Shortness Of Breath and Nausea And Vomiting    Elevated heart rate  . Sulfa Antibiotics Other (See Comments)    Gets bad yeast infection    Prescriptions Prior to Admission  Medication Sig Dispense Refill Last Dose  . prenatal vitamin w/FE, FA (PRENATAL 1 + 1) 27-1 MG TABS Take 1 tablet by mouth daily.  03/23/2017 at Unknown time    Review of Systems  Respiratory: Negative.   Cardiovascular: Negative.   Gastrointestinal: Positive for abdominal pain. Negative for diarrhea, nausea and vomiting.  Genitourinary: Negative for dysuria.  Musculoskeletal: Negative.   Neurological: Negative.   Psychiatric/Behavioral: Negative.    Physical Exam   Blood pressure 112/66, pulse 97, temperature 98.9 F (37.2 C), temperature source Oral, resp. rate 17, height 5\' 4"  (1.626 m), weight 179 lb (81.2 kg), unknown if currently breastfeeding.  Physical Exam  Constitutional: She is oriented to person, place, and time. She appears well-developed.  HENT:  Head: Normocephalic.  Neck: Normal range of motion.  Cardiovascular: Normal rate.   Respiratory:  Effort normal.  GI: Soft. Bowel sounds are normal.  Genitourinary:  Genitourinary Comments: NEFG; cervix is closed, long and thick  Musculoskeletal: Normal range of motion.  Neurological: She is alert and oriented to person, place, and time.  Skin: Skin is warm and dry.  Psychiatric: She has a normal mood and affect.    MAU Course  Procedures  MDM -NST: 140 no decels, present acels, mod variability, uterine irratability -Procardia 10 mg, helped only slightly -UA normal Discussed patient's case with Dr. Helane Rima, who agrees that patient is stable for discharge.   Assessment and Plan   1. Preterm contractions    2. PAtient stable for discharge with recommendations for pelvic rest until her appointment early next week.  3. Reviewed warning signs and when to return to the MAU.  4. Patient verbalized understanding.   Mervyn Skeeters Rene Sizelove 03/24/2017, 7:56 PM

## 2017-04-14 ENCOUNTER — Other Ambulatory Visit (HOSPITAL_COMMUNITY): Payer: Self-pay | Admitting: Obstetrics and Gynecology

## 2017-04-14 DIAGNOSIS — R1011 Right upper quadrant pain: Secondary | ICD-10-CM

## 2017-04-15 ENCOUNTER — Ambulatory Visit (HOSPITAL_COMMUNITY): Payer: Medicaid Other

## 2017-04-25 ENCOUNTER — Ambulatory Visit (HOSPITAL_COMMUNITY): Payer: Medicaid Other

## 2017-04-27 ENCOUNTER — Encounter (HOSPITAL_COMMUNITY): Payer: Self-pay

## 2017-04-27 ENCOUNTER — Ambulatory Visit (HOSPITAL_COMMUNITY): Payer: Medicaid Other

## 2017-05-18 ENCOUNTER — Encounter (HOSPITAL_COMMUNITY): Payer: Self-pay

## 2017-05-18 ENCOUNTER — Inpatient Hospital Stay (HOSPITAL_COMMUNITY)
Admission: AD | Admit: 2017-05-18 | Discharge: 2017-05-18 | Disposition: A | Discharge status: Home / Self Care | Payer: Medicaid Other | Source: Ambulatory Visit | Attending: Obstetrics and Gynecology | Admitting: Obstetrics and Gynecology

## 2017-05-18 ENCOUNTER — Inpatient Hospital Stay (HOSPITAL_COMMUNITY): Payer: Medicaid Other

## 2017-05-18 DIAGNOSIS — R109 Unspecified abdominal pain: Secondary | ICD-10-CM | POA: Insufficient documentation

## 2017-05-18 DIAGNOSIS — O368190 Decreased fetal movements, unspecified trimester, not applicable or unspecified: Secondary | ICD-10-CM

## 2017-05-18 DIAGNOSIS — R1030 Lower abdominal pain, unspecified: Secondary | ICD-10-CM

## 2017-05-18 DIAGNOSIS — O368130 Decreased fetal movements, third trimester, not applicable or unspecified: Secondary | ICD-10-CM | POA: Insufficient documentation

## 2017-05-18 DIAGNOSIS — Z3A34 34 weeks gestation of pregnancy: Secondary | ICD-10-CM | POA: Diagnosis not present

## 2017-05-18 DIAGNOSIS — O26893 Other specified pregnancy related conditions, third trimester: Secondary | ICD-10-CM | POA: Diagnosis not present

## 2017-05-18 LAB — URINALYSIS, ROUTINE W REFLEX MICROSCOPIC
Bilirubin Urine: NEGATIVE
Glucose, UA: NEGATIVE mg/dL
Hgb urine dipstick: NEGATIVE
KETONES UR: 5 mg/dL — AB
Nitrite: NEGATIVE
PH: 6 (ref 5.0–8.0)
Protein, ur: NEGATIVE mg/dL
Specific Gravity, Urine: 1.014 (ref 1.005–1.030)

## 2017-05-18 NOTE — MAU Note (Signed)
Pt reports she was sitting after dinner she felt a sharp pain and "pop". Increased pelvic pressure and back pain and stomach feels tight. Fetal movement is less than usual.

## 2017-05-18 NOTE — MAU Provider Note (Signed)
Chief Complaint:  Abdominal Pain   First Provider Initiated Contact with Patient 05/18/17 1958     HPI: Mary Cannon is a 31 y.o. G3P1102 at 69w3dwho presents to maternity admissions reporting feeling a pop in lower abdomen followed by sharp pain and back pain. Pain is over her C/S scar. Also feels decreased fetal movement.. She denies LOF, vaginal bleeding, vaginal itching/burning, urinary symptoms, h/a, dizziness, n/v, diarrhea, constipation or fever/chills.  She denies headache, visual changes or RUQ abdominal pain.  Hx remarkable for PPROm with abruption and neonatal death in first pregnancy  Hx umbilical hernia repair  Abdominal Pain  This is a new problem. The current episode started today. The onset quality is sudden. The problem occurs constantly. The problem has been unchanged. The pain is located in the suprapubic region. The pain is moderate. The quality of the pain is sharp. The abdominal pain does not radiate. Pertinent negatives include no fever, myalgias, nausea or vomiting. The pain is aggravated by palpation. The pain is relieved by nothing. She has tried nothing for the symptoms. Her past medical history is significant for abdominal surgery.   RN Note: Pt reports she was sitting after dinner she felt a sharp pain and "pop". Increased pelvic pressure and back pain and stomach feels tight. Fetal movement is less than usual.   Past Medical History: Past Medical History:  Diagnosis Date  . Abdominal wall mass   . Anemia   . Endometriosis   . Headache    Migraines  . Ovarian cyst   . Seasonal allergies   . Shortness of breath dyspnea    with pregnancy    Past obstetric history: OB History  Gravida Para Term Preterm AB Living  3 2 1 1  0 2  SAB TAB Ectopic Multiple Live Births  0 0 0   2    # Outcome Date GA Lbr Len/2nd Weight Sex Delivery Anes PTL Lv  3 Current           2 Term 07/21/15 [redacted]w[redacted]d  7 lb 9.3 oz (3.44 kg) F CS-LVertical Spinal  LIV  1 Preterm 07/17/11  [redacted]w[redacted]d  2 lb 0.1 oz (0.91 kg) M CS-LTranv Spinal  LIV    Obstetric Comments  1st pregnancy delivered at 27 weeks, partial abruption, baby died within 24 hours of delivery.    Past Surgical History: Past Surgical History:  Procedure Laterality Date  . CESAREAN SECTION  07/17/2011   Procedure: CESAREAN SECTION;  Surgeon: Luz Lex, MD;  Location: [facility];  Service: Gynecology;  Laterality: N/A;  . CESAREAN SECTION    . CESAREAN SECTION N/A 07/21/2015   Procedure: CESAREAN SECTION;  Surgeon: Louretta Shorten, MD;  Location: [facility];  Service: Obstetrics;  Laterality: N/A;   EDC 07/26/15  [name] RNFA confirmed 04/21/15  . UMBILICAL HERNIA REPAIR N/A 03/05/2014   Procedure: open abdominal wall exploration with removal of mass possible mesh repair ;  Surgeon: [name], MD;  Location: [facility];  Service: General;  Laterality: N/A;    Family History: Family History  Problem Relation Age of Onset  . Miscarriages / [place] Mother   . Arthritis Maternal Grandmother   . COPD Maternal Grandmother   . Heart disease Maternal Grandmother   . Hyperlipidemia Maternal Grandmother   . Hypertension Maternal Grandmother   . Diabetes Paternal Grandmother     Social History: Social History  Substance Use Topics  . Smoking status: Never Smoker  . Smokeless tobacco: Never Used  .  Alcohol use Yes     Comment: social- not with preg    Allergies:  Allergies  Allergen Reactions  . Azo [Phenazopyridine] Shortness Of Breath and Nausea And Vomiting    Elevated heart rate  . Sulfa Antibiotics Other (See Comments)    Gets bad yeast infection    Meds:  Prescriptions Prior to Admission  Medication Sig Dispense Refill Last Dose  . prenatal vitamin w/FE, FA (PRENATAL 1 + 1) 27-1 MG TABS Take 1 tablet by mouth daily.     03/23/2017 at Unknown time    I have reviewed patient's Past Medical Hx, Surgical Hx, Family Hx, Social Hx, medications and allergies.   ROS:  Review of Systems  Constitutional:  Negative for fever.  Gastrointestinal: Positive for abdominal pain. Negative for nausea and vomiting.  Musculoskeletal: Negative for myalgias.   Other systems negative  Physical Exam  Patient Vitals for the past 24 hrs:  BP Temp Pulse Resp Height Weight  05/18/17 1934 (!) 142/86 98.6 F (37 C) (!) 101 18 5\' 4"  (1.626 m) 188 lb 1.9 oz (85.3 kg)   Constitutional: Well-developed, well-nourished female in no acute distress.  Cardiovascular: normal rate and rhythm Respiratory: normal effort, clear to auscultation bilaterally GI: Abd soft, non-tender, gravid appropriate for gestational age.   No rebound or guarding. MS: Extremities nontender, no edema, normal ROM Neurologic: Alert and oriented x 4.  GU: Neg CVAT.  PELVIC EXAM:  Dilation: Closed Effacement (%): Thick Cervical Position: Posterior Station: Ballotable Exam by:: Hansel Feinstein, CNM  FHT:  Baseline 135 , moderate variability, accelerations present, no decelerations Contractions:   Rare   Labs: No results found for this or any previous visit (from the past 24 hour(s)).    Imaging:  Will get BPP due to anxiety, pain and history.  Also has decreased fetal movement.  BPP 8/8 in 5 min AFI subjectively high normal at 24.1 Position transverse, head to right  MAU Course/MDM: I have ordered labs and reviewed results.  NST reviewed and found to be reassuring. Consult Dr Corinna Capra with presentation, exam findings and test results.  Treatments in MAU included EFM.    Assessment: [place] IUP at [redacted]w[redacted]d Pain near incision, likely scar tissue Reassuring fetal heart rate tracing Reassuring BPP  Plan: Discharge home Labor precautions and fetal kick counts Follow up in Office for prenatal visits and recheck of status Encouraged to return here or to other Urgent Care/ED if she develops worsening of symptoms, increase in pain, fever, or other concerning symptoms.   Pt stable at time of discharge.  Hansel Feinstein CNM,  MSN Certified Nurse-Midwife 05/18/2017 8:03 PM

## 2017-05-18 NOTE — Discharge Instructions (Signed)
Third Trimester of Pregnancy The third trimester is from week 28 through week 40 (months 7 through 9). The third trimester is a time when the unborn baby (fetus) is growing rapidly. At the end of the ninth month, the fetus is about 20 inches in length and weighs 6-10 pounds. Body changes during your third trimester Your body will continue to go through many changes during pregnancy. The changes vary from woman to woman. During the third trimester:  Your weight will continue to increase. You can expect to gain 25-35 pounds (11-16 kg) by the end of the pregnancy.  You may begin to get stretch marks on your hips, abdomen, and breasts.  You may urinate more often because the fetus is moving lower into your pelvis and pressing on your bladder.  You may develop or continue to have heartburn. This is caused by increased hormones that slow down muscles in the digestive tract.  You may develop or continue to have constipation because increased hormones slow digestion and cause the muscles that push waste through your intestines to relax.  You may develop hemorrhoids. These are swollen veins (varicose veins) in the rectum that can itch or be painful.  You may develop swollen, bulging veins (varicose veins) in your legs.  You may have increased body aches in the pelvis, back, or thighs. This is due to weight gain and increased hormones that are relaxing your joints.  You may have changes in your hair. These can include thickening of your hair, rapid growth, and changes in texture. Some women also have hair loss during or after pregnancy, or hair that feels dry or thin. Your hair will most likely return to normal after your baby is born.  Your breasts will continue to grow and they will continue to become tender. A yellow fluid (colostrum) may leak from your breasts. This is the first milk you are producing for your baby.  Your belly button may stick out.  You may notice more swelling in your hands,  face, or ankles.  You may have increased tingling or numbness in your hands, arms, and legs. The skin on your belly may also feel numb.  You may feel short of breath because of your expanding uterus.  You may have more problems sleeping. This can be caused by the size of your belly, increased need to urinate, and an increase in your body's metabolism.  You may notice the fetus "dropping," or moving lower in your abdomen (lightening).  You may have increased vaginal discharge.  You may notice your joints feel loose and you may have pain around your pelvic bone.  What to expect at prenatal visits You will have prenatal exams every 2 weeks until week 36. Then you will have weekly prenatal exams. During a routine prenatal visit:  You will be weighed to make sure you and the baby are growing normally.  Your blood pressure will be taken.  Your abdomen will be measured to track your baby's growth.  The fetal heartbeat will be listened to.  Any test results from the previous visit will be discussed.  You may have a cervical check near your due date to see if your cervix has softened or thinned (effaced).  You will be tested for Group B streptococcus. This happens between 35 and 37 weeks.  Your health care provider may ask you:  What your birth plan is.  How you are feeling.  If you are feeling the baby move.  If you have had   any abnormal symptoms, such as leaking fluid, bleeding, severe headaches, or abdominal cramping.  If you are using any tobacco products, including cigarettes, chewing tobacco, and electronic cigarettes.  If you have any questions.  Other tests or screenings that may be performed during your third trimester include:  Blood tests that check for low iron levels (anemia).  Fetal testing to check the health, activity level, and growth of the fetus. Testing is done if you have certain medical conditions or if there are problems during the  pregnancy.  Nonstress test (NST). This test checks the health of your baby to make sure there are no signs of problems, such as the baby not getting enough oxygen. During this test, a belt is placed around your belly. The baby is made to move, and its heart rate is monitored during movement.  What is false labor? False labor is a condition in which you feel small, irregular tightenings of the muscles in the womb (contractions) that usually go away with rest, changing position, or drinking water. These are called Braxton Hicks contractions. Contractions may last for hours, days, or even weeks before true labor sets in. If contractions come at regular intervals, become more frequent, increase in intensity, or become painful, you should see your health care provider. What are the signs of labor?  Abdominal cramps.  Regular contractions that start at 10 minutes apart and become stronger and more frequent with time.  Contractions that start on the top of the uterus and spread down to the lower abdomen and back.  Increased pelvic pressure and dull back pain.  A watery or bloody mucus discharge that comes from the vagina.  Leaking of amniotic fluid. This is also known as your "water breaking." It could be a slow trickle or a gush. Let your health care provider know if it has a color or strange odor. If you have any of these signs, call your health care provider right away, even if it is before your due date. Follow these instructions at home: Medicines  Follow your health care provider's instructions regarding medicine use. Specific medicines may be either safe or unsafe to take during pregnancy.  Take a prenatal vitamin that contains at least 600 micrograms (mcg) of folic acid.  If you develop constipation, try taking a stool softener if your health care provider approves. Eating and drinking  Eat a balanced diet that includes fresh fruits and vegetables, whole grains, good sources of protein  such as meat, eggs, or tofu, and low-fat dairy. Your health care provider will help you determine the amount of weight gain that is right for you.  Avoid raw meat and uncooked cheese. These carry germs that can cause birth defects in the baby.  If you have low calcium intake from food, talk to your health care provider about whether you should take a daily calcium supplement.  Eat four or five small meals rather than three large meals a day.  Limit foods that are high in fat and processed sugars, such as fried and sweet foods.  To prevent constipation: ? Drink enough fluid to keep your urine clear or pale yellow. ? Eat foods that are high in fiber, such as fresh fruits and vegetables, whole grains, and beans. Activity  Exercise only as directed by your health care provider. Most women can continue their usual exercise routine during pregnancy. Try to exercise for 30 minutes at least 5 days a week. Stop exercising if you experience uterine contractions.  Avoid heavy   lifting.  Do not exercise in extreme heat or humidity, or at high altitudes.  Wear low-heel, comfortable shoes.  Practice good posture.  You may continue to have sex unless your health care provider tells you otherwise. Relieving pain and discomfort  Take frequent breaks and rest with your legs elevated if you have leg cramps or low back pain.  Take warm sitz baths to soothe any pain or discomfort caused by hemorrhoids. Use hemorrhoid cream if your health care provider approves.  Wear a good support bra to prevent discomfort from breast tenderness.  If you develop varicose veins: ? Wear support pantyhose or compression stockings as told by your healthcare provider. ? Elevate your feet for 15 minutes, 3-4 times a day. Prenatal care  Write down your questions. Take them to your prenatal visits.  Keep all your prenatal visits as told by your health care provider. This is important. Safety  Wear your seat belt at  all times when driving.  Make a list of emergency phone numbers, including numbers for family, friends, the hospital, and police and fire departments. General instructions  Avoid cat litter boxes and soil used by cats. These carry germs that can cause birth defects in the baby. If you have a cat, ask someone to clean the litter box for you.  Do not travel far distances unless it is absolutely necessary and only with the approval of your health care provider.  Do not use hot tubs, steam rooms, or saunas.  Do not drink alcohol.  Do not use any products that contain nicotine or tobacco, such as cigarettes and e-cigarettes. If you need help quitting, ask your health care provider.  Do not use any medicinal herbs or unprescribed drugs. These chemicals affect the formation and growth of the baby.  Do not douche or use tampons or scented sanitary pads.  Do not cross your legs for long periods of time.  To prepare for the arrival of your baby: ? Take prenatal classes to understand, practice, and ask questions about labor and delivery. ? Make a trial run to the hospital. ? Visit the hospital and tour the maternity area. ? Arrange for maternity or paternity leave through employers. ? Arrange for family and friends to take care of pets while you are in the hospital. ? Purchase a rear-facing car seat and make sure you know how to install it in your car. ? Pack your hospital bag. ? Prepare the baby's nursery. Make sure to remove all pillows and stuffed animals from the baby's crib to prevent suffocation.  Visit your dentist if you have not gone during your pregnancy. Use a soft toothbrush to brush your teeth and be gentle when you floss. Contact a health care provider if:  You are unsure if you are in labor or if your water has broken.  You become dizzy.  You have mild pelvic cramps, pelvic pressure, or nagging pain in your abdominal area.  You have lower back pain.  You have persistent  nausea, vomiting, or diarrhea.  You have an unusual or bad smelling vaginal discharge.  You have pain when you urinate. Get help right away if:  Your water breaks before 37 weeks.  You have regular contractions less than 5 minutes apart before 37 weeks.  You have a fever.  You are leaking fluid from your vagina.  You have spotting or bleeding from your vagina.  You have severe abdominal pain or cramping.  You have rapid weight loss or weight gain.    You have shortness of breath with chest pain.  You notice sudden or extreme swelling of your face, hands, ankles, feet, or legs.  Your baby makes fewer than 10 movements in 2 hours.  You have severe headaches that do not go away when you take medicine.  You have vision changes. Summary  The third trimester is from week 28 through week 40, months 7 through 9. The third trimester is a time when the unborn baby (fetus) is growing rapidly.  During the third trimester, your discomfort may increase as you and your baby continue to gain weight. You may have abdominal, leg, and back pain, sleeping problems, and an increased need to urinate.  During the third trimester your breasts will keep growing and they will continue to become tender. A yellow fluid (colostrum) may leak from your breasts. This is the first milk you are producing for your baby.  False labor is a condition in which you feel small, irregular tightenings of the muscles in the womb (contractions) that eventually go away. These are called Braxton Hicks contractions. Contractions may last for hours, days, or even weeks before true labor sets in.  Signs of labor can include: abdominal cramps; regular contractions that start at 10 minutes apart and become stronger and more frequent with time; watery or bloody mucus discharge that comes from the vagina; increased pelvic pressure and dull back pain; and leaking of amniotic fluid. This information is not intended to replace advice  given to you by your health care provider. Make sure you discuss any questions you have with your health care provider. Document Released: 09/07/2001 Document Revised: 02/19/2016 Document Reviewed: 11/14/2012 Elsevier Interactive Patient Education  2017 [employer]  

## 2017-06-04 NOTE — H&P (Addendum)
Mary Cannon is a 31 y.o. female presenting for repeat c/s.  Pregnancy uncomplicated.. OB History    Gravida Para Term Preterm AB Living   3 2 1 1  0 2   SAB TAB Ectopic Multiple Live Births   0 0 0   2      Obstetric Comments   1st pregnancy delivered at 27 weeks, partial abruption, baby died within 24 hours of delivery.     Past Medical History:  Diagnosis Date  . Abdominal wall mass   . Anemia   . Endometriosis   . Headache    Migraines  . Ovarian cyst   . Seasonal allergies   . Shortness of breath dyspnea    with pregnancy   Past Surgical History:  Procedure Laterality Date  . CESAREAN SECTION  07/17/2011   Procedure: CESAREAN SECTION;  Surgeon: Luz Lex, MD;  Location: [facility];  Service: Gynecology;  Laterality: N/A;  . CESAREAN SECTION    . CESAREAN SECTION N/A 07/21/2015   Procedure: CESAREAN SECTION;  Surgeon: Louretta Shorten, MD;  Location: [facility];  Service: Obstetrics;  Laterality: N/A;   EDC 07/26/15  [name] RNFA confirmed 04/21/15  . UMBILICAL HERNIA REPAIR N/A 03/05/2014   Procedure: open abdominal wall exploration with removal of mass possible mesh repair ;  Surgeon: [name], MD;  Location: [facility];  Service: General;  Laterality: N/A;   Family History: family history includes Arthritis in her maternal grandmother; COPD in her maternal grandmother; Diabetes in her paternal grandmother; Heart disease in her maternal grandmother; Hyperlipidemia in her maternal grandmother; Hypertension in her maternal grandmother; Miscarriages / [place] in her mother. Social History:  reports that she has never smoked. She has never used smokeless tobacco. She reports that she drinks alcohol. She reports that she does not use drugs.     Maternal Diabetes: No Genetic Screening: Normal Maternal Ultrasounds/Referrals: Normal Fetal Ultrasounds or other Referrals:  None Maternal Substance Abuse:  No Significant Maternal Medications:  None Significant Maternal Lab  Results:  None Other Comments:  None  ROS History   unknown if currently breastfeeding. Exam Physical Exam  Prenatal labs: ABO, Rh:   Antibody:   Rubella:   RPR:    HBsAg:    HIV:    GBS:     Assessment/Plan: IUP at 39 weeks Prev c/s x 2 for repeat Risks and benefits of C/S were discussed.  All questions were answered and informed consent was obtained.  Plan to proceed with low segment transverse Cesarean Section.   Mary Cannon C 06/04/2017, 11:44 AM This patient has been seen and examined.   All of her questions were answered.  Labs and vital signs reviewed.  Informed consent has been obtained.  The History and Physical is current .06/20/17 0715 DL

## 2017-06-10 ENCOUNTER — Encounter (HOSPITAL_COMMUNITY): Payer: Self-pay

## 2017-06-13 ENCOUNTER — Encounter (HOSPITAL_COMMUNITY): Payer: Self-pay

## 2017-06-17 ENCOUNTER — Encounter (HOSPITAL_COMMUNITY)
Admission: RE | Admit: 2017-06-17 | Discharge: 2017-06-17 | Disposition: A | Discharge status: Home / Self Care | Payer: Medicaid Other | Source: Ambulatory Visit | Attending: Obstetrics and Gynecology | Admitting: Obstetrics and Gynecology

## 2017-06-17 DIAGNOSIS — Z01812 Encounter for preprocedural laboratory examination: Secondary | ICD-10-CM | POA: Diagnosis not present

## 2017-06-17 LAB — CBC
HEMATOCRIT: 35.1 % — AB (ref 36.0–46.0)
HEMOGLOBIN: 11.7 g/dL — AB (ref 12.0–15.0)
MCH: 28.6 pg (ref 26.0–34.0)
MCHC: 33.3 g/dL (ref 30.0–36.0)
MCV: 85.8 fL (ref 78.0–100.0)
Platelets: 174 10*3/uL (ref 150–400)
RBC: 4.09 MIL/uL (ref 3.87–5.11)
RDW: 14 % (ref 11.5–15.5)
WBC: 10.7 10*3/uL — ABNORMAL HIGH (ref 4.0–10.5)

## 2017-06-17 LAB — TYPE AND SCREEN
ABO/RH(D): O POS
Antibody Screen: NEGATIVE

## 2017-06-17 NOTE — Patient Instructions (Signed)
Mary Cannon  06/17/2017   Your procedure is scheduled on:  06/20/2017  Enter through the Main Entrance of [facility] up the phone at the desk and dial [phone].   Call this number if you have problems the morning of surgery: [phone]   Remember:   Do not eat food:After Midnight.  Do not drink clear liquids: After Midnight.  Take these medicines the morning of surgery with A SIP OF WATER: none   Do not wear jewelry, make-up or nail polish.  Do not wear lotions, powders, or perfumes. Do not wear deodorant.  Do not shave 48 hours prior to surgery.  Do not bring valuables to the hospital.  [facility] is not   responsible for any belongings or valuables brought to the hospital.  Contacts, dentures or bridgework may not be worn into surgery.  Leave suitcase in the car. After surgery it may be brought to your room.  For patients admitted to the hospital, checkout time is 11:00 AM the day of              discharge.   Patients discharged the day of surgery will not be allowed to drive             home.  Name and phone number of your driver: na  Special Instructions:   N/A   Please read over the following fact sheets that you were given:   Surgical Site Infection Prevention

## 2017-06-18 LAB — RPR: RPR: NONREACTIVE

## 2017-06-20 ENCOUNTER — Encounter (HOSPITAL_COMMUNITY): Payer: Self-pay | Admitting: *Deleted

## 2017-06-20 ENCOUNTER — Inpatient Hospital Stay (HOSPITAL_COMMUNITY): Payer: Medicaid Other | Admitting: Anesthesiology

## 2017-06-20 ENCOUNTER — Encounter (HOSPITAL_COMMUNITY): Payer: Self-pay

## 2017-06-20 ENCOUNTER — Encounter (HOSPITAL_COMMUNITY)
Admission: AD | Disposition: A | Discharge status: Home / Self Care | Payer: Self-pay | Source: Ambulatory Visit | Attending: Obstetrics and Gynecology

## 2017-06-20 ENCOUNTER — Inpatient Hospital Stay (HOSPITAL_COMMUNITY)
Admission: AD | Admit: 2017-06-20 | Discharge: 2017-06-22 | DRG: 766 | Disposition: A | Discharge status: Home / Self Care | Payer: Medicaid Other | Source: Ambulatory Visit | Attending: Obstetrics and Gynecology | Admitting: Obstetrics and Gynecology

## 2017-06-20 DIAGNOSIS — Z6832 Body mass index (BMI) 32.0-32.9, adult: Secondary | ICD-10-CM | POA: Diagnosis not present

## 2017-06-20 DIAGNOSIS — O99214 Obesity complicating childbirth: Secondary | ICD-10-CM | POA: Diagnosis present

## 2017-06-20 DIAGNOSIS — E669 Obesity, unspecified: Secondary | ICD-10-CM | POA: Diagnosis present

## 2017-06-20 DIAGNOSIS — D649 Anemia, unspecified: Secondary | ICD-10-CM | POA: Diagnosis present

## 2017-06-20 DIAGNOSIS — O9902 Anemia complicating childbirth: Secondary | ICD-10-CM | POA: Diagnosis present

## 2017-06-20 DIAGNOSIS — O34211 Maternal care for low transverse scar from previous cesarean delivery: Principal | ICD-10-CM | POA: Diagnosis present

## 2017-06-20 DIAGNOSIS — Z3A39 39 weeks gestation of pregnancy: Secondary | ICD-10-CM

## 2017-06-20 DIAGNOSIS — Z23 Encounter for immunization: Secondary | ICD-10-CM

## 2017-06-20 SURGERY — Surgical Case
Anesthesia: Spinal

## 2017-06-20 MED ORDER — MORPHINE SULFATE (PF) 0.5 MG/ML IJ SOLN
INTRAMUSCULAR | Status: AC
  Filled 2017-06-20: qty 10

## 2017-06-20 MED ORDER — SIMETHICONE 80 MG PO CHEW
80.0000 mg | CHEWABLE_TABLET | ORAL | Status: DC | PRN
  Administered 2017-06-21 (×2): 80 mg via ORAL

## 2017-06-20 MED ORDER — COCONUT OIL OIL: 1.0000 "application " | TOPICAL_OIL | Status: DC | PRN

## 2017-06-20 MED ORDER — ONDANSETRON HCL 4 MG/2ML IJ SOLN
INTRAMUSCULAR | Status: DC | PRN
  Administered 2017-06-20: 4 mg via INTRAVENOUS

## 2017-06-20 MED ORDER — MEPERIDINE HCL 25 MG/ML IJ SOLN: 6.2500 mg | INTRAMUSCULAR | Status: DC | PRN

## 2017-06-20 MED ORDER — SOD CITRATE-CITRIC ACID 500-334 MG/5ML PO SOLN
30.0000 mL | Freq: Once | ORAL | Status: AC
  Administered 2017-06-20: 30 mL via ORAL

## 2017-06-20 MED ORDER — NALOXONE HCL 0.4 MG/ML IJ SOLN: 0.4000 mg | INTRAMUSCULAR | Status: DC | PRN

## 2017-06-20 MED ORDER — PHENYLEPHRINE 8 MG IN D5W 100 ML (0.08MG/ML) PREMIX OPTIME
INJECTION | INTRAVENOUS | Status: AC
  Filled 2017-06-20: qty 100

## 2017-06-20 MED ORDER — IBUPROFEN 600 MG PO TABS
600.0000 mg | ORAL_TABLET | Freq: Four times a day (QID) | ORAL | Status: DC
  Administered 2017-06-20 – 2017-06-22 (×7): 600 mg via ORAL
  Filled 2017-06-20 (×7): qty 1

## 2017-06-20 MED ORDER — PHENYLEPHRINE HCL 10 MG/ML IJ SOLN
INTRAMUSCULAR | Status: DC | PRN
  Administered 2017-06-20: 40 ug via INTRAVENOUS
  Administered 2017-06-20: 80 ug via INTRAVENOUS

## 2017-06-20 MED ORDER — NALBUPHINE HCL 10 MG/ML IJ SOLN: 5.0000 mg | INTRAMUSCULAR | Status: DC | PRN

## 2017-06-20 MED ORDER — PRENATAL MULTIVITAMIN CH
1.0000 | ORAL_TABLET | Freq: Every day | ORAL | Status: DC
  Administered 2017-06-21: 1 via ORAL
  Filled 2017-06-20: qty 1

## 2017-06-20 MED ORDER — OXYCODONE HCL 5 MG PO TABS: 10.0000 mg | ORAL_TABLET | ORAL | Status: DC | PRN

## 2017-06-20 MED ORDER — BUPIVACAINE IN DEXTROSE 0.75-8.25 % IT SOLN
INTRATHECAL | Status: AC
  Filled 2017-06-20: qty 2

## 2017-06-20 MED ORDER — MORPHINE SULFATE (PF) 0.5 MG/ML IJ SOLN
INTRAMUSCULAR | Status: DC | PRN
  Administered 2017-06-20: .2 mg via INTRATHECAL

## 2017-06-20 MED ORDER — PHENYLEPHRINE 8 MG IN D5W 100 ML (0.08MG/ML) PREMIX OPTIME
INJECTION | INTRAVENOUS | Status: DC | PRN
  Administered 2017-06-20: 60 ug/min via INTRAVENOUS

## 2017-06-20 MED ORDER — LACTATED RINGERS IV SOLN: INTRAVENOUS | Status: DC

## 2017-06-20 MED ORDER — DIPHENHYDRAMINE HCL 50 MG/ML IJ SOLN: 12.5000 mg | INTRAMUSCULAR | Status: DC | PRN

## 2017-06-20 MED ORDER — MENTHOL 3 MG MT LOZG: 1.0000 | LOZENGE | OROMUCOSAL | Status: DC | PRN

## 2017-06-20 MED ORDER — SODIUM CHLORIDE 0.9% FLUSH: 3.0000 mL | INTRAVENOUS | Status: DC | PRN

## 2017-06-20 MED ORDER — LACTATED RINGERS IV SOLN
INTRAVENOUS | Status: DC | PRN
  Administered 2017-06-20 (×2): via INTRAVENOUS

## 2017-06-20 MED ORDER — ONDANSETRON HCL 4 MG/2ML IJ SOLN
INTRAMUSCULAR | Status: AC
  Filled 2017-06-20: qty 2

## 2017-06-20 MED ORDER — OXYCODONE HCL 5 MG PO TABS
5.0000 mg | ORAL_TABLET | ORAL | Status: DC | PRN
  Administered 2017-06-21: 5 mg via ORAL
  Filled 2017-06-20: qty 1

## 2017-06-20 MED ORDER — TETANUS-DIPHTH-ACELL PERTUSSIS 5-2.5-18.5 LF-MCG/0.5 IM SUSP: 0.5000 mL | Freq: Once | INTRAMUSCULAR | Status: DC

## 2017-06-20 MED ORDER — DIPHENHYDRAMINE HCL 25 MG PO CAPS: 25.0000 mg | ORAL_CAPSULE | ORAL | Status: DC | PRN

## 2017-06-20 MED ORDER — NALBUPHINE HCL 10 MG/ML IJ SOLN: 5.0000 mg | Freq: Once | INTRAMUSCULAR | Status: DC | PRN

## 2017-06-20 MED ORDER — ACETAMINOPHEN 325 MG PO TABS: 650.0000 mg | ORAL_TABLET | ORAL | Status: DC | PRN

## 2017-06-20 MED ORDER — ONDANSETRON HCL 4 MG/2ML IJ SOLN: 4.0000 mg | Freq: Three times a day (TID) | INTRAMUSCULAR | Status: DC | PRN

## 2017-06-20 MED ORDER — OXYTOCIN 10 UNIT/ML IJ SOLN
INTRAMUSCULAR | Status: AC
  Filled 2017-06-20: qty 4

## 2017-06-20 MED ORDER — PHENYLEPHRINE 40 MCG/ML (10ML) SYRINGE FOR IV PUSH (FOR BLOOD PRESSURE SUPPORT)
PREFILLED_SYRINGE | INTRAVENOUS | Status: AC
  Filled 2017-06-20: qty 10

## 2017-06-20 MED ORDER — CEFOTETAN DISODIUM-DEXTROSE 2-2.08 GM-% IV SOLR
2.0000 g | INTRAVENOUS | Status: AC
  Administered 2017-06-20: 2 g via INTRAVENOUS
  Filled 2017-06-20: qty 50

## 2017-06-20 MED ORDER — KETOROLAC TROMETHAMINE 30 MG/ML IJ SOLN
30.0000 mg | Freq: Four times a day (QID) | INTRAMUSCULAR | Status: AC | PRN
  Administered 2017-06-20: 30 mg via INTRAMUSCULAR

## 2017-06-20 MED ORDER — WITCH HAZEL-GLYCERIN EX PADS: 1.0000 "application " | MEDICATED_PAD | CUTANEOUS | Status: DC | PRN

## 2017-06-20 MED ORDER — FENTANYL CITRATE (PF) 100 MCG/2ML IJ SOLN: 25.0000 ug | INTRAMUSCULAR | Status: DC | PRN

## 2017-06-20 MED ORDER — PROMETHAZINE HCL 25 MG/ML IJ SOLN: 6.2500 mg | INTRAMUSCULAR | Status: DC | PRN

## 2017-06-20 MED ORDER — KETOROLAC TROMETHAMINE 30 MG/ML IJ SOLN: 30.0000 mg | Freq: Four times a day (QID) | INTRAMUSCULAR | Status: AC | PRN

## 2017-06-20 MED ORDER — SCOPOLAMINE 1 MG/3DAYS TD PT72
1.0000 | MEDICATED_PATCH | Freq: Once | TRANSDERMAL | Status: DC
  Administered 2017-06-20: 1.5 mg via TRANSDERMAL
  Filled 2017-06-20: qty 1

## 2017-06-20 MED ORDER — NALOXONE HCL 2 MG/2ML IJ SOSY
1.0000 ug/kg/h | PREFILLED_SYRINGE | INTRAMUSCULAR | Status: DC | PRN
  Filled 2017-06-20: qty 2

## 2017-06-20 MED ORDER — OXYTOCIN 10 UNIT/ML IJ SOLN
INTRAVENOUS | Status: DC | PRN
  Administered 2017-06-20: 40 [IU] via INTRAVENOUS

## 2017-06-20 MED ORDER — SIMETHICONE 80 MG PO CHEW
80.0000 mg | CHEWABLE_TABLET | ORAL | Status: DC
  Administered 2017-06-20 – 2017-06-21 (×2): 80 mg via ORAL
  Filled 2017-06-20 (×2): qty 1

## 2017-06-20 MED ORDER — BUPIVACAINE IN DEXTROSE 0.75-8.25 % IT SOLN
INTRATHECAL | Status: DC | PRN
  Administered 2017-06-20: 1.6 mL via INTRATHECAL

## 2017-06-20 MED ORDER — FAMOTIDINE 20 MG PO TABS
20.0000 mg | ORAL_TABLET | Freq: Once | ORAL | Status: AC
  Administered 2017-06-20: 20 mg via ORAL
  Filled 2017-06-20: qty 1

## 2017-06-20 MED ORDER — OXYTOCIN 40 UNITS IN LACTATED RINGERS INFUSION - SIMPLE MED: 2.5000 [IU]/h | INTRAVENOUS | Status: AC

## 2017-06-20 MED ORDER — DEXAMETHASONE SODIUM PHOSPHATE 4 MG/ML IJ SOLN
INTRAMUSCULAR | Status: AC
  Filled 2017-06-20: qty 1

## 2017-06-20 MED ORDER — SOD CITRATE-CITRIC ACID 500-334 MG/5ML PO SOLN
ORAL | Status: AC
  Filled 2017-06-20: qty 15

## 2017-06-20 MED ORDER — ZOLPIDEM TARTRATE 5 MG PO TABS
5.0000 mg | ORAL_TABLET | Freq: Every evening | ORAL | Status: DC | PRN
Start: 2017-06-20 — End: 2017-06-22

## 2017-06-20 MED ORDER — SIMETHICONE 80 MG PO CHEW
80.0000 mg | CHEWABLE_TABLET | Freq: Three times a day (TID) | ORAL | Status: DC
  Administered 2017-06-20 – 2017-06-21 (×2): 80 mg via ORAL
  Filled 2017-06-20 (×3): qty 1

## 2017-06-20 MED ORDER — DIBUCAINE 1 % RE OINT: 1.0000 "application " | TOPICAL_OINTMENT | RECTAL | Status: DC | PRN

## 2017-06-20 MED ORDER — DEXAMETHASONE SODIUM PHOSPHATE 4 MG/ML IJ SOLN
INTRAMUSCULAR | Status: DC | PRN
  Administered 2017-06-20: 4 mg via INTRAVENOUS

## 2017-06-20 MED ORDER — DIPHENHYDRAMINE HCL 25 MG PO CAPS: 25.0000 mg | ORAL_CAPSULE | Freq: Four times a day (QID) | ORAL | Status: DC | PRN

## 2017-06-20 MED ORDER — FENTANYL CITRATE (PF) 100 MCG/2ML IJ SOLN
INTRAMUSCULAR | Status: AC
  Filled 2017-06-20: qty 2

## 2017-06-20 MED ORDER — FENTANYL CITRATE (PF) 100 MCG/2ML IJ SOLN
INTRAMUSCULAR | Status: DC | PRN
  Administered 2017-06-20: 10 ug via INTRATHECAL

## 2017-06-20 MED ORDER — ACETAMINOPHEN 500 MG PO TABS
1000.0000 mg | ORAL_TABLET | Freq: Four times a day (QID) | ORAL | Status: AC
  Administered 2017-06-20 – 2017-06-21 (×4): 1000 mg via ORAL
  Filled 2017-06-20 (×4): qty 2

## 2017-06-20 MED ORDER — SODIUM CHLORIDE 0.9 % IR SOLN
Status: DC | PRN
  Administered 2017-06-20: 1

## 2017-06-20 MED ORDER — KETOROLAC TROMETHAMINE 30 MG/ML IJ SOLN
INTRAMUSCULAR | Status: AC
  Administered 2017-06-20: 30 mg via INTRAMUSCULAR
  Filled 2017-06-20: qty 1

## 2017-06-20 MED ORDER — SENNOSIDES-DOCUSATE SODIUM 8.6-50 MG PO TABS
2.0000 | ORAL_TABLET | ORAL | Status: DC
  Administered 2017-06-20 – 2017-06-21 (×2): 2 via ORAL
  Filled 2017-06-20 (×2): qty 2

## 2017-06-20 MED ORDER — MEASLES, MUMPS & RUBELLA VAC ~~LOC~~ INJ: 0.5000 mL | INJECTION | Freq: Once | SUBCUTANEOUS | Status: DC

## 2017-06-20 SURGICAL SUPPLY — 30 items
BENZOIN TINCTURE PRP APPL 2/3 (GAUZE/BANDAGES/DRESSINGS) ×3 IMPLANT
CHLORAPREP W/TINT 26ML (MISCELLANEOUS) ×3 IMPLANT
CLAMP CORD UMBIL (MISCELLANEOUS) IMPLANT
CLOSURE STERI STRIP 1/2 X4 (GAUZE/BANDAGES/DRESSINGS) ×3 IMPLANT
CLOTH BEACON ORANGE TIMEOUT ST (SAFETY) ×3 IMPLANT
DRSG OPSITE POSTOP 4X10 (GAUZE/BANDAGES/DRESSINGS) ×3 IMPLANT
ELECT REM PT RETURN 9FT ADLT (ELECTROSURGICAL) ×3
ELECTRODE REM PT RTRN 9FT ADLT (ELECTROSURGICAL) ×1 IMPLANT
EXTRACTOR VACUUM M CUP 4 TUBE (SUCTIONS) IMPLANT
EXTRACTOR VACUUM M CUP 4' TUBE (SUCTIONS)
GLOVE BIOGEL PI IND STRL 7.0 (GLOVE) ×1 IMPLANT
GLOVE BIOGEL PI INDICATOR 7.0 (GLOVE) ×2
GLOVE SURG ORTHO 8.0 STRL STRW (GLOVE) ×3 IMPLANT
GOWN STRL REUS W/TWL LRG LVL3 (GOWN DISPOSABLE) ×6 IMPLANT
KIT ABG SYR 3ML LUER SLIP (SYRINGE) ×3 IMPLANT
NEEDLE HYPO 25X5/8 SAFETYGLIDE (NEEDLE) ×3 IMPLANT
NS IRRIG 1000ML POUR BTL (IV SOLUTION) ×3 IMPLANT
PACK C SECTION WH (CUSTOM PROCEDURE TRAY) ×3 IMPLANT
PAD OB MATERNITY 4.3X12.25 (PERSONAL CARE ITEMS) ×3 IMPLANT
PENCIL SMOKE EVAC W/HOLSTER (ELECTROSURGICAL) ×3 IMPLANT
SPONGE LAP 18X18 X RAY DECT (DISPOSABLE) ×3 IMPLANT
SUT MNCRL 0 VIOLET CTX 36 (SUTURE) ×3 IMPLANT
SUT MON AB 4-0 PS1 27 (SUTURE) ×3 IMPLANT
SUT MONOCRYL 0 CTX 36 (SUTURE) ×6
SUT PDS AB 1 CT  36 (SUTURE)
SUT PDS AB 1 CT 36 (SUTURE) IMPLANT
SUT VIC AB 1 CTX 36 (SUTURE)
SUT VIC AB 1 CTX36XBRD ANBCTRL (SUTURE) IMPLANT
TOWEL OR 17X24 6PK STRL BLUE (TOWEL DISPOSABLE) ×3 IMPLANT
TRAY FOLEY BAG SILVER LF 14FR (SET/KITS/TRAYS/PACK) ×3 IMPLANT

## 2017-06-20 NOTE — Op Note (Signed)
Cesarean Section Procedure Note  Pre-operative Diagnosis: IUP at 39 weeks, prev C/S x 2 for repeat  Post-operative Diagnosis: same  Surgeon: Luz Lex   Assistants: Nira Conn, CFNA  Anesthesia: spinal  Procedure:  Low Segment Transverse cesarean section  Procedure Details  The patient was seen in the Holding Room. The risks, benefits, complications, treatment options, and expected outcomes were discussed with the patient.  The patient concurred with the proposed plan, giving informed consent.  The site of surgery properly noted/marked.. A Time Out was held and the above information confirmed.  After induction of anesthesia, the patient was draped and prepped in the usual sterile manner. A Pfannenstiel incision was made and carried down through the subcutaneous tissue to the fascia. Fascial incision was made and extended transversely. The fascia was separated from the underlying rectus tissue superiorly and inferiorly. The peritoneum was identified and entered. Peritoneal incision was extended longitudinally. The utero-vesical peritoneal reflection was incised transversely and the bladder flap was bluntly freed from the lower uterine segment. A low transverse uterine incision was made. Delivered from vertex presentation was a baby with Apgar scores of 10 at one minute and 10 at five minutes. After the umbilical cord was clamped and cut cord blood was obtained for evaluation. The placenta was removed intact and appeared normal. The uterine outline, tubes and ovaries appeared normal. The uterine incision was closed with running locked sutures of 0 monocryl and imbricated with 0 monocryl. Hemostasis was observed. Lavage was carried out until clear. The peritoneum was then closed with 0 monocryl and rectus muscles plicated in the midline.  After hemostasis was assured, the fascia was then reapproximated with running sutures of 0 Vicryl. Irrigation was applied and after adequate hemostasis was assured,  the skin was reapproximated with subcutaneous sutures using 4-0 monocryl.  Instrument, sponge, and needle counts were correct prior the abdominal closure and at the conclusion of the case. The patient received 2 grams cefotetan preoperatively.  Findings: Viable female  Estimated Blood Loss:  500cc         Specimens: Placenta was sent to labor and delivery         Complications:  None

## 2017-06-20 NOTE — Addendum Note (Signed)
Addendum  created 06/20/17 1608 by Raenette Rover, CRNA   Sign clinical note

## 2017-06-20 NOTE — Anesthesia Postprocedure Evaluation (Signed)
Anesthesia Post Note  Patient: Mary Cannon  Procedure(s) Performed: Procedure(s) (LRB): REPEAT CESAREAN SECTION (N/A)     Patient location during evaluation: Mother Baby Anesthesia Type: Spinal Level of consciousness: awake, awake and alert, oriented and patient cooperative Pain management: pain level controlled Vital Signs Assessment: post-procedure vital signs reviewed and stable Respiratory status: spontaneous breathing, nonlabored ventilation and respiratory function stable Cardiovascular status: stable Postop Assessment: no headache, no backache, no apparent nausea or vomiting and patient able to bend at knees Anesthetic complications: no    Last Vitals:  Vitals:   06/20/17 1340 06/20/17 1422  BP: 102/61   Pulse: (!) 58 66  Resp: 18 18  Temp: 36.6 C 36.8 C  SpO2: 99%     Last Pain:  Vitals:   06/20/17 1448  TempSrc:   PainSc: 0-No pain   Pain Goal: Patients Stated Pain Goal: 2 (06/20/17 1118)               Chevon Fomby L

## 2017-06-20 NOTE — Lactation Note (Addendum)
This note was copied from a baby's chart. Lactation Consultation Note  Patient Name: Mary Cannon Date: 06/20/2017 Reason for consult: Initial assessment (LC enc mom to call w/  feeding cues/ baby holding baby ) Randel Books is 63 hours old, has attempted at the breast x 1 .  LC encouraged  Mom to call  For feeding cues , also if it has been awhile since the baby  has been  STS , it is recommended to enhance milk coming in.  Mother informed of post-discharge support and given phone number to the lactation department, including services for phone call assistance; out-patient appointments; and breastfeeding support group. List of other breastfeeding resources in the community given in the handout. Encouraged mother to call for problems or concerns related to breastfeeding.  Maternal Data    Feeding Feeding Type:  (LC enc to call with feeding cues )  LATCH Score ( Latch score by the [facility] RN )  Latch: Too sleepy or reluctant, no latch achieved, no sucking elicited. (spitting up mucous frequently)  Audible Swallowing: None  Type of Nipple: Everted at rest and after stimulation  Comfort (Breast/Nipple): Soft / non-tender  Hold (Positioning): Full assist, staff holds infant at breast  LATCH Score: 4  Interventions    Lactation Tools Discussed/Used     Consult Status Consult Status: Follow-up Date: 06/20/17 Follow-up type: In-patient    [place] 06/20/2017, 11:58 AM

## 2017-06-20 NOTE — Anesthesia Preprocedure Evaluation (Addendum)
Anesthesia Evaluation  Patient identified by MRN, date of birth, ID band Patient awake    Reviewed: Allergy & Precautions, NPO status , Patient's Chart, lab work & pertinent test results  Airway Mallampati: II  TM Distance: >3 FB Neck ROM: Full    Dental  (+) Teeth Intact, Dental Advisory Given   Pulmonary neg pulmonary ROS,    Pulmonary exam normal breath sounds clear to auscultation       Cardiovascular negative cardio ROS Normal cardiovascular exam Rhythm:Regular Rate:Normal     Neuro/Psych  Headaches, negative psych ROS   GI/Hepatic negative GI ROS, Neg liver ROS,   Endo/Other  Obesity   Renal/GU negative Renal ROS     Musculoskeletal negative musculoskeletal ROS (+)   Abdominal   Peds  Hematology negative hematology ROS (+) Blood dyscrasia, anemia , Plt 174k   Anesthesia Other Findings Day of surgery medications reviewed with the patient.  Reproductive/Obstetrics (+) Pregnancy H/o previous C-section x2                            Anesthesia Physical Anesthesia Plan  ASA: II  Anesthesia Plan: Spinal   Post-op Pain Management:    Induction:   PONV Risk Score and Plan: 2 and Ondansetron, Dexamethasone, Scopolamine patch - Pre-op and Treatment may vary due to age or medical condition  Airway Management Planned:   Additional Equipment:   Intra-op Plan:   Post-operative Plan:   Informed Consent: I have reviewed the patients History and Physical, chart, labs and discussed the procedure including the risks, benefits and alternatives for the proposed anesthesia with the patient or authorized representative who has indicated his/her understanding and acceptance.   Dental advisory given  Plan Discussed with: CRNA, Anesthesiologist and Surgeon  Anesthesia Plan Comments: (Discussed risks and benefits of and differences between spinal and general. Discussed risks of spinal  including headache, backache, failure, bleeding, infection, and nerve damage. Patient consents to spinal. Questions answered. Coagulation studies and platelet count acceptable.)       Anesthesia Quick Evaluation

## 2017-06-20 NOTE — Anesthesia Postprocedure Evaluation (Signed)
Anesthesia Post Note  Patient: Mary Cannon  Procedure(s) Performed: Procedure(s) (LRB): REPEAT CESAREAN SECTION (N/A)     Patient location during evaluation: PACU Anesthesia Type: Spinal Level of consciousness: oriented and awake and alert Pain management: pain level controlled Vital Signs Assessment: post-procedure vital signs reviewed and stable Respiratory status: spontaneous breathing, respiratory function stable and nonlabored ventilation Cardiovascular status: blood pressure returned to baseline and stable Postop Assessment: no headache, no backache, no apparent nausea or vomiting, spinal receding and patient able to bend at knees Anesthetic complications: no    Last Vitals:  Vitals:   06/20/17 1118 06/20/17 1228  BP: 99/62 (!) 101/58  Pulse: 67 61  Resp: 18 18  Temp: 36.7 C 36.9 C  SpO2: 99% 98%    Last Pain:  Vitals:   06/20/17 1228  TempSrc: Oral  PainSc:    Pain Goal: Patients Stated Pain Goal: 2 (06/20/17 1118)               Catalina Gravel

## 2017-06-20 NOTE — Transfer of Care (Signed)
Immediate Anesthesia Transfer of Care Note  Patient: Mary Cannon  Procedure(s) Performed: Procedure(s) with comments: REPEAT CESAREAN SECTION (N/A) - REPEAT EDC 06/26/17 ALLERGY TO AZO Jill Side, RNFA  Patient Location: PACU  Anesthesia Type:Spinal  Level of Consciousness: awake, alert  and oriented  Airway & Oxygen Therapy: Patient Spontanous Breathing  Post-op Assessment: Report given to RN and Post -op Vital signs reviewed and stable  Post vital signs: Reviewed and stable BP 106/65, SaO2 97%, HR 90, RR 15  Last Vitals:  Vitals:   06/20/17 0606  BP: 92/77  Pulse: 85  Temp: 36.9 C    Last Pain:  Vitals:   06/20/17 0606  TempSrc: Oral         Complications: No apparent anesthesia complications

## 2017-06-20 NOTE — Progress Notes (Signed)
Assessed pt's honeycomb dressing. Dressing is halfway saturated.  Notified Dr. Corinna Capra, change honeycomb dressing.

## 2017-06-20 NOTE — Plan of Care (Signed)
Problem: Role Relationship: Goal: Ability to demonstrate positive interaction with newborn will improve Outcome: Completed/Met Date Met: 06/20/17 Patient is bonding well with newborn.

## 2017-06-20 NOTE — Anesthesia Procedure Notes (Signed)
Spinal  Patient location during procedure: OR Start time: 06/20/2017 7:35 AM End time: 06/20/2017 7:37 AM Staffing Anesthesiologist: Catalina Gravel Performed: anesthesiologist  Preanesthetic Checklist Completed: patient identified, surgical consent, pre-op evaluation, timeout performed, IV checked, risks and benefits discussed and monitors and equipment checked Spinal Block Patient position: sitting Prep: site prepped and draped and DuraPrep Patient monitoring: continuous pulse ox and blood pressure Approach: midline Location: L3-4 Injection technique: single-shot Needle Needle type: Pencan  Needle gauge: 24 G Assessment Sensory level: T4 Additional Notes Functioning IV was confirmed and monitors were applied. Sterile prep and drape, including hand hygiene, mask and sterile gloves were used. The patient was positioned and the spine was prepped. The skin was anesthetized with lidocaine.  Free flow of clear CSF was obtained prior to injecting local anesthetic into the CSF.  The spinal needle aspirated freely following injection.  The needle was carefully withdrawn.  The patient tolerated the procedure well. Consent was obtained prior to procedure with all questions answered and concerns addressed. Risks including but not limited to bleeding, infection, nerve damage, paralysis, failed block, inadequate analgesia, allergic reaction, high spinal, itching and headache were discussed and the patient wished to proceed.   Hoy Morn, MD

## 2017-06-21 LAB — CBC
HEMATOCRIT: 29.2 % — AB (ref 36.0–46.0)
Hemoglobin: 9.9 g/dL — ABNORMAL LOW (ref 12.0–15.0)
MCH: 29.2 pg (ref 26.0–34.0)
MCHC: 33.9 g/dL (ref 30.0–36.0)
MCV: 86.1 fL (ref 78.0–100.0)
PLATELETS: 146 10*3/uL — AB (ref 150–400)
RBC: 3.39 MIL/uL — ABNORMAL LOW (ref 3.87–5.11)
RDW: 14.1 % (ref 11.5–15.5)
WBC: 12.5 10*3/uL — AB (ref 4.0–10.5)

## 2017-06-21 LAB — BIRTH TISSUE RECOVERY COLLECTION (PLACENTA DONATION)

## 2017-06-21 MED ORDER — INFLUENZA VAC SPLIT QUAD 0.5 ML IM SUSY
0.5000 mL | PREFILLED_SYRINGE | INTRAMUSCULAR | Status: AC
  Administered 2017-06-21: 0.5 mL via INTRAMUSCULAR
  Filled 2017-06-21: qty 0.5

## 2017-06-21 NOTE — Progress Notes (Signed)
Subjective: Postpartum Day 1: Cesarean Delivery Patient reports tolerating PO.    Objective: Vital signs in last 24 hours: Temp:  [97.7 F (36.5 C)-98.4 F (36.9 C)] 98.3 F (36.8 C) (09/25 0550) Pulse Rate:  [58-85] 62 (09/25 0550) Resp:  [13-20] 18 (09/25 0550) BP: (89-108)/(48-71) 89/48 (09/25 0550) SpO2:  [96 %-100 %] 97 % (09/25 0550)  Physical Exam:  General: alert Lochia: appropriate Uterine Fundus: firm Incision: healing well DVT Evaluation: No evidence of DVT seen on physical exam.   Recent Labs  06/21/17 0531  HGB 9.9*  HCT 29.2*    Assessment/Plan: Status post Cesarean section. Doing well postoperatively.  Continue current care.  Hattie Pine M 06/21/2017, 8:09 AM

## 2017-06-21 NOTE — Plan of Care (Signed)
Problem: Activity: Goal: Ability to tolerate increased activity will improve Outcome: Completed/Met Date Met: 06/21/17 Activity level increased.

## 2017-06-21 NOTE — Plan of Care (Signed)
Problem: Activity: Goal: Will verbalize the importance of balancing activity with adequate rest periods Outcome: Completed/Met Date Met: 06/21/17 Patient stated that she was up walking a lot today. Rest periods encouraged.

## 2017-06-21 NOTE — Progress Notes (Signed)
Patient states she is not passing gas. Audible bowel sounds heard. Dr. Roselie Awkward notified of her low BP and stated IV can come out. Patient is up and ambulating and is not dizzy at all . Patient told to ambulate halls and use incentive Spirometer. Plan of care and Pain regimen told.

## 2017-06-21 NOTE — Lactation Note (Signed)
This note was copied from a baby's chart. Lactation Consultation Note  Patient Name: Mary Cannon BEEFE'O Date: 06/21/2017 Reason for consult: Follow-up assessment Baby at 33 hr of life. Upon entry there were multiple visitors present and one of them was offering a bottle of formula. Mom stated bf is going "good". Instructed mom to call for lactation at the next bf.   Maternal Data    Feeding Feeding Type: Formula  LATCH Score                   Interventions    Lactation Tools Discussed/Used     Consult Status Consult Status: Follow-up Date: 06/22/17 Follow-up type: In-patient    Denzil Hughes 06/21/2017, 5:55 PM

## 2017-06-21 NOTE — Plan of Care (Signed)
Problem: Urinary Elimination: Goal: Ability to reestablish a normal urinary elimination pattern will improve Outcome: Completed/Met Date Met: 06/21/17 Foley catheter removed. Patient is voiding with no issues.

## 2017-06-22 MED ORDER — IBUPROFEN 600 MG PO TABS: 600.0000 mg | ORAL_TABLET | Freq: Four times a day (QID) | ORAL | 0 refills | Status: AC | PRN

## 2017-06-22 MED ORDER — OXYCODONE HCL 5 MG PO TABS
5.0000 mg | ORAL_TABLET | ORAL | 0 refills | Status: DC | PRN
Start: 2017-06-22 — End: 2020-12-30

## 2017-06-22 MED ORDER — DOCUSATE SODIUM 100 MG PO CAPS: 100.0000 mg | ORAL_CAPSULE | Freq: Two times a day (BID) | ORAL | 2 refills | Status: DC

## 2017-06-22 MED ORDER — FERROUS SULFATE 325 (65 FE) MG PO TBEC: 325.0000 mg | DELAYED_RELEASE_TABLET | Freq: Two times a day (BID) | ORAL | 2 refills | Status: DC

## 2017-06-22 NOTE — Progress Notes (Signed)
Patient states she is passing gas.

## 2017-06-22 NOTE — Discharge Summary (Signed)
Obstetric Discharge Summary Reason for Admission: cesarean section Prenatal Procedures: none Intrapartum Procedures: cesarean: low cervical, transverse Postpartum Procedures: none Complications-Operative and Postpartum: none Hemoglobin  Date Value Ref Range Status  06/21/2017 9.9 (L) 12.0 - 15.0 g/dL Final   HCT  Date Value Ref Range Status  06/21/2017 29.2 (L) 36.0 - 46.0 % Final    Physical Exam:  General: alert, cooperative and appears stated age 31: appropriate Uterine Fundus: firm Incision: healing well, no significant drainage, no dehiscence, no significant erythema DVT Evaluation: No evidence of DVT seen on physical exam. Negative Homan's sign. No cords or calf tenderness. No significant calf/ankle edema.  Discharge Diagnoses: Term Pregnancy-delivered  Discharge Information: Date: 06/22/2017 Activity: pelvic rest Diet: routine Medications: Ibuprofen, Colace, Iron and oxycodone Condition: stable Instructions: refer to practice specific booklet Discharge to: home   Newborn Data: Live born female  Birth Weight: 7 lb 12.7 oz (3535 g) APGAR: 10, 10  Home with mother.  Mary Cannon Mary Cannon 06/22/2017, 8:14 AM

## 2017-06-22 NOTE — Lactation Note (Signed)
This note was copied from a baby's chart. Lactation Consultation Note  Patient Name: Boy Sarabeth Benton VFIEP'P Date: 06/22/2017 Reason for consult: Follow-up assessment;Infant weight loss (6% weight loss )  Baby is 50 hours old , and feeding preference - breast / formula, lots of bottles Mom denies soreness, sore nipples and engorgement prevention and tx reviewed.  Per mom   Has a DEBP ( Medela) at home  Mother informed of post-discharge support and given phone number to the lactation department, including services for phone call assistance; out-patient appointments; and breastfeeding support group. List of other breastfeeding resources in the community given in the handout. Encouraged mother to call for problems or concerns related to breastfeeding.   Maternal Data Has patient been taught Hand Expression?: Yes (per mom ) Does the patient have breastfeeding experience prior to this delivery?: Yes  Feeding Feeding Type: Bottle Fed - Formula Nipple Type: Slow - flow  LATCH Score                   Interventions Interventions: Breast feeding basics reviewed  Lactation Tools Discussed/Used WIC Program: No Pump Review: Milk Storage Initiated by:: MAI  Date initiated:: 06/22/17   Consult Status Consult Status: Complete Date: 06/22/17    Jerlyn Ly Shem Plemmons 06/22/2017, 10:14 AM

## 2017-09-14 ENCOUNTER — Encounter (HOSPITAL_COMMUNITY): Payer: Self-pay | Admitting: Obstetrics and Gynecology

## 2020-12-11 ENCOUNTER — Ambulatory Visit: Payer: BC Managed Care – PPO | Admitting: Neurology

## 2020-12-30 ENCOUNTER — Ambulatory Visit: Payer: BC Managed Care – PPO | Admitting: Neurology

## 2020-12-30 ENCOUNTER — Encounter: Payer: Self-pay | Admitting: Neurology

## 2020-12-30 VITALS — BP 111/80 | HR 87 | Ht 64.0 in | Wt 180.0 lb

## 2020-12-30 DIAGNOSIS — H539 Unspecified visual disturbance: Secondary | ICD-10-CM

## 2020-12-30 NOTE — Progress Notes (Signed)
[facility]  PATIENT: Mary Cannon DOB: January 22, 1986  REFERRING DOCTOR OR PCP: Macarthur Critchley, OD SOURCE: Patient, notes from Dr. Nicki Reaper  _________________________________   HISTORICAL  CHIEF COMPLAINT:  Chief Complaint  Patient presents with  . New Patient (Initial Visit)    RM 13, alone. Paper referral from [facility] for floaters/visual disturbances. Pt reports vision glares/"sparks of light", wavy words. Right eye mostly affected.    HISTORY OF PRESENT ILLNESS:  I had the pleasure of seeing your patient, Mary Cannon, at [facility] for neurologic consultation regarding her visual disturbances.  She is a 35 year old woman who noted difficulties with vision that started suddenly in 2013.  Onset had no pain.  At that time, she had a black spot in her left off center to the left.   That symptoms has persisted in a stable mannet (did not improve or worsen over time).   For years, only th left eye bothered her.    However, she notes several different visual symptoms  OD over the last 18 months.  Initially, she noted floaters and then had increased visual glare (like sun hitting the TV).     Colors are duller OD.   She notes light scintillations peripherally out of both eyes.   When she looks up she has more visual glare.   She had some right eye pain 18 months ago and still gets occasional pain now in either eye.     She has occasional migraine headaches.  She had pregnancies 2012, 2016, 2018.   She felt she had more visual disturbances during the 2nd and 3rd pregnancies and then improved.  She saw Dr. Nicki Reaper, OD.   Her optometric exam was normal.      She denies weakness.  She has periods of time when the legs feel heavy. She feels a bit off balanced but no falls.  She feels she needs to hold the bannister going downtairs at times.  No difficulties with bladder function.   REVIEW OF SYSTEMS: Constitutional: No fevers, chills,  sweats, or change in appetite Eyes: Visual disturbance as above Ear, nose and throat: No hearing loss, ear pain, nasal congestion, sore throat Cardiovascular: No chest pain, palpitations Respiratory: No shortness of breath at rest or with exertion.   No wheezes GastrointestinaI: No nausea, vomiting, diarrhea, abdominal pain, fecal incontinence Genitourinary: No dysuria, urinary retention or frequency.  No nocturia. Musculoskeletal: No neck pain, back pain Integumentary: No rash, pruritus, skin lesions Neurological: as above Psychiatric: No depression at this time.  No anxiety Endocrine: No palpitations, diaphoresis, change in appetite, change in weigh or increased thirst Hematologic/Lymphatic: No anemia, purpura, petechiae. Allergic/Immunologic: No itchy/runny eyes, nasal congestion, recent allergic reactions, rashes  ALLERGIES: Allergies  Allergen Reactions  . Azo [Phenazopyridine] Shortness Of Breath and Nausea And Vomiting    Elevated heart rate. Possible seizure.  . Sulfa Antibiotics Other (See Comments)    Gets bad yeast infection    HOME MEDICATIONS:  Current Outpatient Medications:  .  ibuprofen (ADVIL,MOTRIN) 600 MG tablet, Take 1 tablet (600 mg total) by mouth every 6 (six) hours as needed., Disp: 30 tablet, Rfl: 0 .  Multiple Vitamin (MULTIVITAMIN) tablet, Take 1 tablet by mouth daily., Disp: , Rfl:   PAST MEDICAL HISTORY: Past Medical History:  Diagnosis Date  . Abdominal wall mass   . Anemia   . Endometriosis   . Headache    Migraines  . Ovarian cyst   . Seasonal  allergies   . Shortness of breath dyspnea    with pregnancy    PAST SURGICAL HISTORY: Past Surgical History:  Procedure Laterality Date  . CESAREAN SECTION  07/17/2011   Procedure: CESAREAN SECTION;  Surgeon: Luz Lex, MD;  Location: [facility];  Service: Gynecology;  Laterality: N/A;  . CESAREAN SECTION N/A 07/21/2015   Procedure: CESAREAN SECTION;  Surgeon: Louretta Shorten, MD;  Location: [facility];  Service: Obstetrics;  Laterality: N/A;   EDC 07/26/15  [name] RNFA confirmed 04/21/15  . CESAREAN SECTION N/A 06/20/2017   Procedure: REPEAT CESAREAN SECTION;  Surgeon: Louretta Shorten, MD;  Location: [facility];  Service: Obstetrics;  Laterality: N/A;  REPEAT EDC 06/26/17 ALLERGY TO AZO Jill Side, RNFA  . excision of endometriosis    . UMBILICAL HERNIA REPAIR N/A 03/05/2014   Procedure: open abdominal wall exploration with removal of mass possible mesh repair ;  Surgeon: [name], MD;  Location: [facility];  Service: General;  Laterality: N/A;    FAMILY HISTORY: Family History  Problem Relation Age of Onset  . Miscarriages / [place] Mother   . High Cholesterol Mother   . Arthritis Maternal Grandmother   . COPD Maternal Grandmother   . Heart disease Maternal Grandmother   . Hyperlipidemia Maternal Grandmother   . Hypertension Maternal Grandmother   . Diabetes Paternal Grandmother   . Cancer Paternal Grandmother   . Cancer Maternal Grandfather   . Thyroid disease Maternal Grandfather     SOCIAL HISTORY:  Social History   Socioeconomic History  . Marital status: Married    Spouse name: Doren Custard "Josh" Hallenbeck  . Number of children: 2  . Years of education: Bachelors   . Highest education level: Not on file  Occupational History  . Not on file  Tobacco Use  . Smoking status: Never Smoker  . Smokeless tobacco: Never Used  Substance and Sexual Activity  . Alcohol use: Yes    Comment: several times per month  . Drug use: No  . Sexual activity: Not Currently  Other Topics Concern  . Not on file  Social History Narrative   Lives w/ husband and 2 kids   Right handed    Caffeine use: coffee/tea daily   Social Determinants of Health   Financial Resource Strain: Not on file  Food Insecurity: Not on file  Transportation Needs: Not on file  Physical Activity: Not on file  Stress: Not on file  Social Connections: Not on file  Intimate Partner Violence: Not  on file     PHYSICAL EXAM  Vitals:   12/30/20 1113  BP: 111/80  Pulse: 87  Weight: 180 lb (81.6 kg)  Height: 5\' 4"  (1.626 m)    Body mass index is 30.9 kg/m.   Hearing Screening   125Hz  250Hz  500Hz  1000Hz  2000Hz  3000Hz  4000Hz  6000Hz  8000Hz   Right ear:           Left ear:             Visual Acuity Screening   Right eye Left eye Both eyes  Without correction: 20/40 20/30 20/30   With correction:       General: The patient is well-developed and well-nourished and in no acute distress  HEENT:  Head is [place]/AT.  Sclera are anicteric.  Funduscopic exam shows normal optic discs and retinal vessels.  Neck: No carotid bruits are noted.  The neck is nontender.  Cardiovascular: The heart has a regular rate and rhythm with a normal  S1 and S2. There were no murmurs, gallops or rubs.    Skin: Extremities are without rash or  edema.  Musculoskeletal:  Back is nontender  Neurologic Exam  Mental status: The patient is alert and oriented x 3 at the time of the examination. The patient has apparent normal recent and remote memory, with an apparently normal attention span and concentration ability.   Speech is normal.  Cranial nerves: Extraocular movements are full. Pupils are equal, round, and reactive to light and accomodation.  Visual fields are full.  Facial symmetry is present. There is good facial sensation to soft touch bilaterally.Facial strength is normal.  Trapezius and sternocleidomastoid strength is normal. No dysarthria is noted.  The tongue is midline, and the patient has symmetric elevation of the soft palate. No obvious hearing deficits are noted.  Motor:  Muscle bulk is normal.   Tone is normal. Strength is  5 / 5 in all 4 extremities.   Sensory: Sensory testing is intact to pinprick, soft touch and vibration sensation in all 4 extremities.  Coordination: Cerebellar testing reveals good finger-nose-finger and heel-to-shin bilaterally.  Gait and station: Station is  normal.   Gait is normal. Tandem gait is normal. Romberg is negative.   Reflexes: Deep tendon reflexes are symmetric and normal bilaterally.   Plantar responses are flexor.      ASSESSMENT AND PLAN  Vision disturbance - Plan: MR BRAIN W WO CONTRAST, Visual evoked potential test    In summary, Ms. Fittro is a 35 year old woman with visual disturbances.  Issues began in 2013 with the left eye but more recently she has had more difficulties with the right eye.  The etiology is uncertain.  Due to her age and that color vision is mildly affected, we need to be concerned about the possibility of optic neuritis.  We will check a visual evoked potential to determine if there is any slowing involving the visual pathways that could be consistent with a history of optic neuritis.  Additionally we will check an MRI of the brain to determine if there is any demyelination consistent with MS or other pathologic process.  She will return to see me as needed if results of both tests are normal or come in if she has evidence of past optic neuritis or MS changes on the brain.  She should also call us if she has new or worsening neurologic symptoms.  Thank you for asking me to see Ms. Behrle.  Please let me know if I can be of further assistance with her or other patients in the future.   Bently Morath A. Felecia Shelling, MD, [facility] 03/03/1244, 80:99 AM Certified in Neurology, Clinical Neurophysiology, Sleep Medicine and Neuroimaging  [facility] [address], [place],  [address] [phone]

## 2021-01-01 ENCOUNTER — Telehealth: Payer: Self-pay | Admitting: Neurology

## 2021-01-01 NOTE — Telephone Encounter (Signed)
no to the covid questions MR Brain w/wo contrast Dr. Felecia Shelling [facility] Josem Kaufmann: [id number] (exp. 12/31/20 to 06/29/21). Patient is scheduled at [facility] for 01/07/21.

## 2021-01-07 ENCOUNTER — Other Ambulatory Visit: Payer: Self-pay

## 2021-01-07 ENCOUNTER — Ambulatory Visit: Payer: BC Managed Care – PPO

## 2021-01-07 DIAGNOSIS — H539 Unspecified visual disturbance: Secondary | ICD-10-CM

## 2021-01-07 MED ORDER — GADOBENATE DIMEGLUMINE 529 MG/ML IV SOLN
15.0000 mL | Freq: Once | INTRAVENOUS | Status: AC | PRN
  Administered 2021-01-07: 15 mL via INTRAVENOUS

## 2021-01-12 ENCOUNTER — Telehealth: Payer: Self-pay | Admitting: *Deleted

## 2021-01-12 NOTE — Telephone Encounter (Signed)
-----   Message from Britt Bottom, MD sent at 01/09/2021  8:59 AM EDT ----- Please let her know the MRI looks good.   It is normal.

## 2021-01-12 NOTE — Telephone Encounter (Signed)
Called and spoke with pt about MRI results per Dr. Garth Bigness note. Pt verbalized understanding. She has visual evoked potential test tomorrow.

## 2021-01-13 ENCOUNTER — Telehealth: Payer: Self-pay | Admitting: Neurology

## 2021-01-13 NOTE — Telephone Encounter (Signed)
Noted  

## 2021-01-13 NOTE — Telephone Encounter (Signed)
Pt called cancelled appt due to having strep throat.

## 2022-04-15 ENCOUNTER — Encounter
Admit: 2022-04-15 | Discharge: 2022-04-15 | Payer: PRIVATE HEALTH INSURANCE | Attending: Hematology & Oncology | Primary: Hematology & Oncology

## 2022-05-04 NOTE — Progress Notes (Unsigned)
New Hematology/Oncology Consult   Requesting MD: Dr. 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  Punch biopsy of a right anterior thigh lesion on 04/15/2022 returned consistent with mastocytosis.  About 7 or 8 years ago she began to notice pruritic skin lesions on her upper legs that occurred with exercise or heat exposure.  More recently she has noticed similar lesions on her stomach and less so arms.  No fevers.  She notes that she sweats easily.  Multiple flushing episodes a day. She is intermittently fatigued.  At times feet feel heavy and she has deep pain like "bone pain" in her legs.  Some days she has dyspnea on exertion and chest heaviness.  To her the breathing seems labored.  Intermittent trouble focusing.  Stationary "black spots" in visual field.  She also has floaters.  No diplopia.  She has periodic itching around the lips, tongue and mouth.  More recently she has noticed these symptoms after eating caramelizedonions.  She has a good appetite.  Intentional weight loss of 25 pounds over the past year.  No bleeding except menstrual cycle though she did have a single nosebleed recently.  She reports a history of chronic constipation until about a month ago when she began to note that stools were looser than typical and some days has bowel movements soon after eating.  No urinary symptoms.  She is lightheaded and dizzy at times.  Left-sided headache with certain foods.  She has not noticed any enlarged lymph nodes.  No abdominal pain. No red or edematous joints.     Past Medical History:  Diagnosis Date   Abdominal wall mass    Anemia    Endometriosis    Headache    Migraines   Ovarian cyst    Seasonal allergies    Shortness of breath dyspnea    with pregnancy     Past Surgical History:  Procedure Laterality Date   CESAREAN  SECTION  07/17/2011   Procedure: CESAREAN SECTION;  Surgeon: Luz Lex, MD;  Location: [facility];  Service: Gynecology;  Laterality: N/A;   CESAREAN SECTION N/A 07/21/2015   Procedure: CESAREAN SECTION;  Surgeon: Louretta Shorten, MD;  Location: [facility];  Service: Obstetrics;  Laterality: N/A;   EDC 07/26/15  [name] RNFA confirmed 04/21/15   CESAREAN SECTION N/A 06/20/2017   Procedure: REPEAT CESAREAN SECTION;  Surgeon: Louretta Shorten, MD;  Location: [facility];  Service: Obstetrics;  Laterality: N/A;  REPEAT EDC 06/26/17 ALLERGY TO AZO Heather K, RNFA   excision of endometriosis     UMBILICAL HERNIA REPAIR N/A 03/05/2014   Procedure: open abdominal wall exploration with removal of mass possible mesh repair ;  Surgeon: [name], MD;  Location: [facility];  Service: General;  Laterality: N/A;     Current Outpatient Medications:    ibuprofen (ADVIL,MOTRIN) 600 MG tablet, Take 1 tablet (600 mg total) by mouth every 6 (six) hours as needed., Disp: 30 tablet, Rfl: 0   progesterone (PROMETRIUM) 200 MG capsule, Take 200 mg by mouth daily. Takes for 2 weeks every month then stops, Disp: , Rfl:    Multiple Vitamin (MULTIVITAMIN) tablet, Take 1 tablet by mouth daily. (Patient not taking: Reported on 05/05/2022), Disp: , Rfl: :     Allergies  Allergen Reactions   Azo [Phenazopyridine] Shortness Of Breath and Nausea And  Vomiting    Elevated heart rate. Possible seizure.   Other Anaphylaxis    "I had local anesthesia at the dentist that made my throat swell" Pt reports swelling, pain, blacking-out. Pt reports that she has no reaction to lidocaine   Sulfa Antibiotics Other (See Comments)    Gets bad yeast infection    FH: Mother and father overall in good health.  Sister has psoriasis and either psoriatic arthritis or rheumatoid arthritis, receiving a "biologic" treatment.  Brother in good health.  SOCIAL HISTORY: She lives in [place].  She is married.  3 pregnancies, 2 living children.  First  pregnancy she delivered at 27 weeks, partial abruption, died within 24 hours of delivery.  4-year-old son is currently undergoing evaluation for "autoimmune" condition.  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  No tobacco use.  Social EtOH intake.  Review of Systems: See HPI.   Physical Exam:  Blood pressure 101/75, pulse 70, temperature 98.2 F (36.8 C), temperature source Oral, resp. rate 18, height 5' 4" (1.626 m), weight 156 lb 12.8 oz (71.1 kg), SpO2 100 %, unknown if currently breastfeeding.  HEENT: No thrush or ulcers.  Mucous membranes are moist. Lungs: Distant breath sounds. Cardiac: Regular rate and rhythm. Abdomen: Abdomen soft and nontender.  No hepatosplenomegaly.  No mass. Vascular: No leg edema. Lymph nodes: Shotty left scalene lymph node.  No palpable cervical, supraclavicular, axillary or inguinal lymph nodes. Neurologic: Alert and oriented. Skin: Multiple light brown flat skin lesions bilateral upper legs, less so at the lower legs, abdomen and arms.  Suture at biopsy site right upper outer leg.  LABS:  No results for input(s): "WBC", "HGB", "HCT", "PLT" in the last 72 hours.  No results for input(s): "NA", "K", "CL", "CO2", "GLUCOSE", "BUN", "CREATININE", "CALCIUM" in the last 72 hours.    RADIOLOGY:  No results found.  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  In addition to multiple skin lesions she has complaints suggestive of a systemic process.  We are obtaining baseline labs today to include a CBC, chemistry panel, LDH and tryptase.  We are making a referral to Dr. Vandeventer at [facility].  We scheduled follow-up here in 6 to 8 weeks but are available sooner as needed.  Patient seen with Dr. Sherrill.     Lisa Thomas,  NP 05/05/2022, 12:14 PM    This was a shared visit with Lisa Thomas.  Mary Cannon was interviewed and examined.  Biopsy of a skin lesion revealed mastocytosis.  She has symptoms suggestive of a systemic illness, potentially systemic mastocytosis.  We discussed the uncommon nature of systemic mastocytosis with Mary Cannon.  We will obtain additional diagnostic evaluation to include a tryptase level.  She understands the likely need for additional evaluation with imaging to look for evidence of splenomegaly/lymphadenopathy, and potentially a bone marrow biopsy.  We will make a referral to the hematopoietic malignancy service at [facility] to confirm a diagnosis and make treatment recommendations.  I was present for greater than 50% of today's visit.  I performed medical decision making.  Brad Sherrill, MD 

## 2022-05-05 ENCOUNTER — Inpatient Hospital Stay: Payer: BC Managed Care – PPO

## 2022-05-05 ENCOUNTER — Encounter: Payer: Self-pay | Admitting: Nurse Practitioner

## 2022-05-05 ENCOUNTER — Inpatient Hospital Stay: Payer: BC Managed Care – PPO | Attending: Nurse Practitioner | Admitting: Nurse Practitioner

## 2022-05-05 VITALS — BP 101/75 | HR 70 | Temp 98.2°F | Resp 18 | Ht 64.0 in | Wt 156.8 lb

## 2022-05-05 DIAGNOSIS — R0600 Dyspnea, unspecified: Secondary | ICD-10-CM | POA: Diagnosis not present

## 2022-05-05 DIAGNOSIS — R232 Flushing: Secondary | ICD-10-CM | POA: Insufficient documentation

## 2022-05-05 DIAGNOSIS — R5383 Other fatigue: Secondary | ICD-10-CM | POA: Insufficient documentation

## 2022-05-05 DIAGNOSIS — M79606 Pain in leg, unspecified: Secondary | ICD-10-CM | POA: Diagnosis not present

## 2022-05-05 DIAGNOSIS — R197 Diarrhea, unspecified: Secondary | ICD-10-CM | POA: Diagnosis not present

## 2022-05-05 DIAGNOSIS — D4701 Cutaneous mastocytosis: Secondary | ICD-10-CM

## 2022-05-05 DIAGNOSIS — R42 Dizziness and giddiness: Secondary | ICD-10-CM | POA: Diagnosis not present

## 2022-05-05 LAB — CBC WITH DIFFERENTIAL (CANCER CENTER ONLY)
Abs Immature Granulocytes: 0.02 10*3/uL (ref 0.00–0.07)
Basophils Absolute: 0 10*3/uL (ref 0.0–0.1)
Basophils Relative: 0 %
Eosinophils Absolute: 0.1 10*3/uL (ref 0.0–0.5)
Eosinophils Relative: 1 %
HCT: 41.1 % (ref 36.0–46.0)
Hemoglobin: 14 g/dL (ref 12.0–15.0)
Immature Granulocytes: 0 %
Lymphocytes Relative: 36 %
Lymphs Abs: 2.4 10*3/uL (ref 0.7–4.0)
MCH: 30.1 pg (ref 26.0–34.0)
MCHC: 34.1 g/dL (ref 30.0–36.0)
MCV: 88.4 fL (ref 80.0–100.0)
Monocytes Absolute: 0.4 10*3/uL (ref 0.1–1.0)
Monocytes Relative: 7 %
Neutro Abs: 3.7 10*3/uL (ref 1.7–7.7)
Neutrophils Relative %: 56 %
Platelet Count: 257 10*3/uL (ref 150–400)
RBC: 4.65 MIL/uL (ref 3.87–5.11)
RDW: 12.2 % (ref 11.5–15.5)
WBC Count: 6.6 10*3/uL (ref 4.0–10.5)
nRBC: 0 % (ref 0.0–0.2)

## 2022-05-05 LAB — CMP (CANCER CENTER ONLY)
ALT: 15 U/L (ref 0–44)
AST: 13 U/L — ABNORMAL LOW (ref 15–41)
Albumin: 4.9 g/dL (ref 3.5–5.0)
Alkaline Phosphatase: 53 U/L (ref 38–126)
Anion gap: 9 (ref 5–15)
BUN: 11 mg/dL (ref 6–20)
CO2: 26 mmol/L (ref 22–32)
Calcium: 9.8 mg/dL (ref 8.9–10.3)
Chloride: 104 mmol/L (ref 98–111)
Creatinine: 0.78 mg/dL (ref 0.44–1.00)
GFR, Estimated: >60 mL/min (ref >60)
Glucose, Bld: 89 mg/dL (ref 70–99)
Potassium: 4.2 mmol/L (ref 3.5–5.1)
Sodium: 139 mmol/L (ref 135–145)
Total Bilirubin: 0.6 mg/dL (ref 0.3–1.2)
Total Protein: 7.5 g/dL (ref 6.5–8.1)

## 2022-05-05 LAB — LACTATE DEHYDROGENASE: LDH: 120 U/L (ref 98–192)

## 2022-05-06 DIAGNOSIS — D4701 Cutaneous mastocytosis: Principal | ICD-10-CM

## 2022-05-07 LAB — TRYPTASE: Tryptase: 17.2 ug/L — ABNORMAL HIGH (ref 2.2–13.2)

## 2022-05-10 ENCOUNTER — Telehealth: Payer: Self-pay

## 2022-05-10 NOTE — Telephone Encounter (Signed)
-----   Message from Ladell Pier, MD sent at 05/07/2022  4:06 PM EDT ----- Please call patient, tryptase level is elevated suggesting a possible diagnosis of systemic mastocytosis, follow-up at [facility] and here as scheduled

## 2022-05-10 NOTE — Telephone Encounter (Signed)
Pt states she is going out of town after the appointment at [place] request to have appointment scheduled after July 13, 2022. Scheduling message sent.

## 2022-05-10 NOTE — Telephone Encounter (Signed)
Pt verbalized understanding. She was contacted by [facility] and has an appointment in October.

## 2022-05-10 NOTE — Telephone Encounter (Signed)
-----   Message from Ladell Pier, MD sent at 05/10/2022  1:40 PM EDT ----- No, okay to schedule the follow-up appointment here a few days after the [facility] appointment ----- Message ----- From: Kelli Hope, LPN Sent: 3/81/8299   9:09 AM EDT To: Ladell Pier, MD  Dr Benay Spice, She has an appointment with [facility] in October should she keep her appointment with you in September ? ----- Message ----- From: Ladell Pier, MD Sent: 05/07/2022   4:06 PM EDT To: [facility]  Please call patient, tryptase level is elevated suggesting a possible diagnosis of systemic mastocytosis, follow-up at [facility] and here as scheduled

## 2022-05-13 DIAGNOSIS — D4701 Cutaneous mastocytosis: Principal | ICD-10-CM

## 2022-06-16 ENCOUNTER — Ambulatory Visit: Payer: BC Managed Care – PPO | Admitting: Oncology

## 2022-06-29 ENCOUNTER — Ambulatory Visit
Admit: 2022-06-29 | Discharge: 2022-06-29 | Payer: PRIVATE HEALTH INSURANCE | Attending: Hematology & Oncology | Primary: Hematology & Oncology

## 2022-06-29 ENCOUNTER — Ambulatory Visit: Admit: 2022-06-29 | Discharge: 2022-06-29 | Payer: PRIVATE HEALTH INSURANCE

## 2022-06-29 DIAGNOSIS — D4701 Cutaneous mastocytosis: Principal | ICD-10-CM

## 2022-06-29 MED ORDER — PREDNISONE 50 MG TABLET
ORAL_TABLET | 0 refills | 0 days | Status: CP
Start: 2022-06-29 — End: ?

## 2022-06-29 MED ORDER — EPINEPHRINE 0.3 MG/0.3 ML INJECTION, AUTO-INJECTOR
Freq: Once | INTRAMUSCULAR | 2 refills | 1 days | Status: CP
Start: 2022-06-29 — End: 2022-06-29

## 2022-06-29 MED ORDER — AVAPRITINIB 25 MG TABLET
ORAL_TABLET | Freq: Every day | ORAL | 5 refills | 30 days | Status: CP
Start: 2022-06-29 — End: 2022-12-26
  Filled 2022-08-17: qty 30, 30d supply, fill #0

## 2022-06-30 DIAGNOSIS — D4701 Cutaneous mastocytosis: Principal | ICD-10-CM

## 2022-07-15 ENCOUNTER — Ambulatory Visit
Admit: 2022-07-15 | Discharge: 2022-07-15 | Payer: PRIVATE HEALTH INSURANCE | Attending: Adult Health | Primary: Adult Health

## 2022-07-15 ENCOUNTER — Other Ambulatory Visit: Admit: 2022-07-15 | Discharge: 2022-07-15 | Payer: PRIVATE HEALTH INSURANCE

## 2022-07-15 DIAGNOSIS — D500 Iron deficiency anemia secondary to blood loss (chronic): Principal | ICD-10-CM

## 2022-07-15 DIAGNOSIS — D4701 Cutaneous mastocytosis: Principal | ICD-10-CM

## 2022-07-15 DIAGNOSIS — D471 Chronic myeloproliferative disease: Principal | ICD-10-CM

## 2022-07-15 DIAGNOSIS — D4702 Systemic mastocytosis: Principal | ICD-10-CM

## 2022-07-15 MED ORDER — CYCLOBENZAPRINE 5 MG TABLET
ORAL_TABLET | Freq: Two times a day (BID) | ORAL | 0 refills | 7.00000 days | Status: CP | PRN
Start: 2022-07-15 — End: 2022-07-22

## 2022-07-16 DIAGNOSIS — D4701 Cutaneous mastocytosis: Principal | ICD-10-CM

## 2022-07-16 MED ORDER — CETIRIZINE 10 MG TABLET
ORAL_TABLET | Freq: Two times a day (BID) | ORAL | 11 refills | 30.00000 days | Status: CP
Start: 2022-07-16 — End: 2023-07-11

## 2022-07-16 MED ORDER — FAMOTIDINE 20 MG TABLET
ORAL_TABLET | Freq: Two times a day (BID) | ORAL | 11 refills | 30.00000 days | Status: CP
Start: 2022-07-16 — End: ?

## 2022-07-19 ENCOUNTER — Inpatient Hospital Stay: Payer: BC Managed Care – PPO | Attending: Nurse Practitioner | Admitting: Oncology

## 2022-07-19 DIAGNOSIS — D4701 Cutaneous mastocytosis: Secondary | ICD-10-CM

## 2022-07-19 NOTE — Progress Notes (Signed)
  [facility] OFFICE VISIT PROGRESS NOTE  I connected with Mary Cannon on 07/19/22 at 11:40 AM EDT by video and verified that I am speaking with the correct person using two identifiers.   I discussed the limitations, risks, security and privacy concerns of performing an evaluation and management service by telemedicine and the availability of in-person appointments. I also discussed with the patient that there may be a patient responsible charge related to this service. The patient expressed understanding and agreed to proceed.     Patient's location: Home Provider's location: Office    Diagnosis: Mastocytosis  INTERVAL HISTORY:   Mary Cannon is seen today for a video visit per her request.  The tryptase level was elevated when we saw her in August.  She saw Dr. 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  She began Pepcid and Zyrtec.  She reports partial improvement in abdominal bloating, nasal congestion, and floating.  She continues to have skin lesions, now on her chest.  She has intermittent diarrhea and pain in the legs. She underwent a bone marrow biopsy on 07/16/2022.  She reports sharp pain lasting for seconds at the right "hip "and leg with position change.  No leg numbness or weakness. She is scheduled for a telehealth visit with Dr. Garnette Scheuermann on 07/29/2022.  He discussed avapritinib therapy with her.    Lab Results:  Lab Results  Component Value Date   WBC 6.6 05/05/2022   HGB 14.0 05/05/2022   HCT 41.1 05/05/2022   MCV 88.4 05/05/2022   PLT 257 05/05/2022   NEUTROABS 3.7 05/05/2022    Imaging:  No results found.  Medications: I have reviewed the patient's current medications.  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  Results from a bone marrow biopsy 07/16/2022 are pending.  She will continue antihistamine therapy.  She is scheduled for telehealth visit at [facility] on 07/29/2022 to review the bone marrow result.  She will return for an office visit here in approximately 1 month.  We we will decide on continued follow-up here versus [facility] when she returns next month.  The right pelvic and leg discomfort is likely related to the recent bone marrow biopsy.  She will call if the pain does not resolve.   I discussed the assessment and treatment plan with the patient. The patient was provided an opportunity to ask questions and all were answered. The patient agreed with the plan and demonstrated an understanding of the instructions.   The patient was advised to call back or seek an in-person evaluation if the symptoms worsen or if the condition fails to improve as anticipated.  I provided 25 minutes of chart review, video, and documentation time minutes of  during this encounter, and > 50% was spent counseling as documented under my assessment & plan.  Betsy Coder ANP/GNP-BC   07/19/2022 11:59 AM

## 2022-07-29 ENCOUNTER — Telehealth
Admit: 2022-07-29 | Discharge: 2022-07-30 | Payer: PRIVATE HEALTH INSURANCE | Attending: Hematology & Oncology | Primary: Hematology & Oncology

## 2022-07-29 DIAGNOSIS — D4701 Cutaneous mastocytosis: Principal | ICD-10-CM

## 2022-08-09 NOTE — Unmapped (Signed)
[facility] Specialty Medication Onboarding    Specialty Medication: Ayvakit  Prior Authorization: Approved   Financial Assistance: No - copay  <$25  Final Copay/Day Supply: $4 / 30    Insurance Restrictions: None     Notes to Pharmacist:     The triage team has completed the benefits investigation and has determined that the patient is able to fill this medication at [facility]. Please contact the patient to complete the onboarding or follow up with the prescribing physician as needed.

## 2022-08-11 ENCOUNTER — Institutional Professional Consult (permissible substitution): Admit: 2022-08-11 | Payer: PRIVATE HEALTH INSURANCE

## 2022-08-11 NOTE — Unmapped (Signed)
Luteolin 500 mg daily brain fog    Left nostril runs  -zyrtec in AM and benadryl in PM (25 mg)     SM symptoms: Flushing, nasal drainage, brain fog, bone pain, itching (water irritating - well water seems better, but city water really irritated) w/ lesions (raised bumps raised/itchy (when gets warm), feels flu-like after a flare (fatigue, pain lower body), SOB w/ flare Outpatient Visit on 07/15/2022   Component Date Value Ref Range Status    Diagnosis 07/15/2022    Final                    Value:This result contains rich text formatting which cannot be displayed here.    Diagnosis Comment 07/15/2022    Final                    Value:This result contains rich text formatting which cannot be displayed here.    Clinical History 07/15/2022    Final                    Value:This result contains rich text formatting which cannot be displayed here.    Gross Description 07/15/2022    Final                    Value:This result contains rich text formatting which cannot be displayed here.    Microscopic Description 07/15/2022    Final                    Value:This result contains rich text formatting which cannot be displayed here.    Disclaimer 07/15/2022    Final                    Value:This result contains rich text formatting which cannot be displayed here.    Flow Cytometry Summary 07/15/2022    Final                    Value:Flow Cytometric Immunophenotyping Results:    Bone marrow aspirate  Heme WBC  17,500 cells/uL     Flow Differential (% of Total)  Viability:   N/A  Total of Markers Charged   15  Markers-1 Charged  14    Gated Population:  7% Lymphocytes    Description of Gated Cells (% of Gated)     B-Cell Markers:    CD19   19%  CD20   22%  Kappa   11%  Lambda        8%    T-Cell Markers:    CD2   78%  CD3   70%  CD3/CD4  48%  Total CD4  48%  CD5   68%  CD7   78%  CD3/CD8  18%  Total CD8  20%    Miscellaneous:    CD10    3%  CD38    75%  CD45    100%  CD57    8%  CD3-/CD16+56  11%  Total CD16+56  12%            Flow Cytometry Interpretation:  Flow cytometric analysis of the bone marrow aspirate reveals a cellular specimen (16109 cells/uL).    A population of 7% cells is gated on the lymphocyte region and is composed of 70% T-cells (CD4: CD8 ratio is 2.6:1) without an aberrant immunophenotype, 19% polytypic B-cells, and 11% NK-cells.    Flow cytometric analysis fails to reveal a  monotypic B-cell or                           overtly aberrant T-cell population.    This test, utilizing analyte-specific reagents (ASR) was developed and its performance characteristics determined by the [facility].  It has not been cleared or approved by the U.S. Food and Drug Administration (FDA).  The FDA has determined that such clearance or approval is not necessary.  This test is used for clinical purposes.  It should not be regarded as investigational or for research.       Cytogenetics Test, Other 07/15/2022 Collected   Final    Specimen Info 07/15/2022    Final                    Value:This result contains rich text formatting which cannot be displayed here.    Myeloid Mutation Panel - MDS & MPN 07/15/2022    Final                    Value:This result contains rich text formatting which cannot be displayed here.    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     The p.Asp816Val (D816V) mutation in the KIT gene is   identified in > 80% of systemic mastocytosis and less   commonly in cutaneous mastocytosis. It represents a minor   diagnostic criterion for systemic mastocytosis in the World   Health Organization [facility]) classification system.     Presence of the KIT D816V mutation confers resistance to   some tyrosine kinase inhibitors such as imatinib, nilotinib   and masitinib but has shown response to midostaurin (PMID:   16109604; [id number]; [id number]). Midostaurin has been   approved by the Food and Drug Administration (FDA) in adult   patients with advanced systemic mastocytosis irrespective   of D816V mutational status   (http://garrison-lawrence.org/  ugs/midostaurin).     -------------------ADDITIONAL INFORMATION-------------------  Method summary - A qualitative, allele-specific polymerase   chain reaction                            a(PCR) was performed on DNA. This was used   to evaluate for the point mutation causing KIT Asp816Val   (D816V). The analytic sensitivity of this assay is 0.1%.  This test was developed and its performance characteristics   determined by [facility] in a manner consistent with CLIA   requirements. This test has not been cleared or approved by   the U.S. Food and Drug Administration.    KIT Asp816Val Signing Pathologist 07/15/2022 Clydie Braun L. Franne Grip, M.D.   Final       Test Performed by:  [facility]  [address] [place], [place], [place] [address]  Lab Director: Paul Dykes M.D. Ph.D.; CLIA# [id number]    Smear - Bone Marrow Patient 07/15/2022 Prepared slide and instrument printout routed to hemepath   Final    Specimen Type 07/15/2022    Final                    Value:This result contains rich text formatting which cannot be displayed here.  RESULTS 07/15/2022    Final                    Value:This result contains rich text formatting which cannot be displayed here.    Interpretation 07/15/2022    Final                    Value:This result contains rich text formatting which cannot be displayed here.    Diagnosis Comment 07/15/2022    Final                    Value:This result contains rich text formatting which cannot be displayed here.    No. Cells 07/15/2022 20   Final    Culture(s) Examined 07/15/2022 2   Final    Cells Analyzed 07/15/2022 18   Final    Cells Karyotyped 07/15/2022 2   Final    Stain(s) Used 07/15/2022 G-Bands   Final    Indication for Study 07/15/2022    Final                    Value:This result contains rich text formatting which cannot be displayed here.       Allergies:   Allergies   Allergen Reactions    Azo Urinary Tract Defense [Methenamine-Sodium Salicylate] Anaphylaxis    Cranberry Other (See Comments) and Palpitations     Other reaction(s): Other (See Comments) Other Anaphylaxis     I had local anesthesia at the dentist that made my throat swell Pt reports swelling, pain, blacking-out. Pt reports that she has no reaction to lidocaine    Phenazopyridine Nausea And Vomiting and Shortness Of Breath     Elevated heart rate. Possible seizure.  Elevated heart rate. Possible seizure.  Elevated heart rate. Possible seizure.  Elevated heart rate. Possible seizure.  Elevated heart rate. Possible seizure.  Elevated heart rate. Possible seizure.  Elevated heart rate. Possible seizure.  Elevated heart rate. Possible seizure.      Anesthetics - Amide Type - Select Amino Amides      Local Anesthetics, causes swelling, pain, itching, rhinitis     Sulfa (Sulfonamide Antibiotics) Other (See Comments)     Other reaction(s): Other (See Comments)  Gets bad yeast infection  Gets bad yeast infection  Gets bad yeast infection    Other reaction(s): Other (See Comments)  Gets bad yeast infection       Drug Interactions: major CYP3A4 substrate, avoid 3A4 inh/ind      Current Medications:  Current Outpatient Medications   Medication Sig Dispense Refill    cetirizine (ZYRTEC) 10 MG tablet Take 1 tablet (10 mg total) by mouth two (2) times a day. 60 tablet 11    famotidine (PEPCID) 20 MG tablet Take 1 tablet (20 mg total) by mouth two (2) times a day. 60 tablet 11    fluticasone propionate (FLONASE) 50 mcg/actuation nasal spray 1 spray by Miscellaneous route daily.      ibuprofen 200 mg cap Take 600 mg by mouth.      progesterone (PROMETRIUM) 100 MG capsule TAKE 1 CAPSULE BY MOUTH ON DAYS 1-14 OF CYCLE 30 DAYS      avapritinib (AYVAKIT) 25 mg tablet Take 1 tablet (25 mg total) by mouth daily. Take on an empty stomach, at least 1 hour before or 2 hours after a meal. 30 tablet 5    EPINEPHrine (EPIPEN) 0.3 mg/0.3 mL injection Inject 0.3 mL (0.3 mg total) into the  muscle once for 1 dose. 0.3 mL 2     No current facility-administered medications for this visit.       Adherence: No barriers identified      Assessment: Ms.Helzer is a 36 y.o. 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 Her symptoms have not been controlled with current management so treatment with targeted therapy is indicated.    Plan:   - Once received (estimated 11/21) START avapritinib 25 mg daily  - Continue supportive care with BID pepcid and BID antihistamine. Will try to take benadryl at bedtime and zyrtec during the day to see if increased antihistamine effect can help with nasal secreations.   - Labs locally expected 12/4, with CPP call 12/5.  - RTC 12/20 with labs/MD    F/u:  Future Appointments   Date Time Provider Department Center   08/31/2022  2:30 PM ONCHEM LEUKEMIA PHARMACIST [id number]   09/15/2022  1:30 PM ADULT ONC LAB [facility]   09/15/2022  2:40 PM Halford Decamp, MD [id number]   11/01/2022 12:00 PM ADULT ONC LAB [facility]   11/01/2022  1:00 PM Sawchak, Adine Madura, AGNP [id number]       I spent 20 minutes with Ms.Sider in direct patient care.      Manfred Arch, PharmD, BCOP, CPP  Pager: [phone]

## 2022-08-12 NOTE — Unmapped (Signed)
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  I am speaking to the patient.    Was a Nurse, learning disability used for this call? No    Verified patient's date of birth / HIPAA.    Specialty medication(s) to be sent: Hematology/Oncology: Ayvakit (avapritinib)    Non-specialty medications/supplies to be sent: none    Medications not needed at this time: none     Ayvakit (avapritinib)    Medication & Administration   Dosage:   Cutaneous mastocytosis: Take 1 tablet (25 mg total) by mouth daily. Take on an empty stomach, at least 1 hour before or 2 hours after a meal.    Administration:   Take on an empty stomach (at least 1 hour before or 2 hours after a meal)    Adherence/Missed dose instructions:  Do not make up for a missed dose within 8 hours of the next scheduled dose.   If vomiting occurs, do not take an additional dose; resume dosing with the next scheduled daily dose.    Goals of Therapy     To prevent disease progression    Side Effects & Monitoring Parameters     Common side effects:  Nausea, vomiting, decreased appetite, abdominal pain, diarrhea or constipation  Weight loss  Swelling - (facial, peripheral or periorbital edema)   Feeling tired or weak or fatigued  Skin rash   Memory impairment; cognitive dysfunctions like confusion, mental status changes, amnesia, altered mental status - Reversible with dose reduction  Change in color of hair or hair loss   Increased tear production   Dizziness or headache  Sleep difficulty  Change in taste  Hair discoloration or loss    The following side effects should be reported to the provider:  Allergic reaction (rash; hives; itching; tightness in the chest or throat; trouble breathing, or swelling of the mouth, face, lips, tongue, or throat)  High blood pressure (very bad headache or dizziness, passing out, or change in eyesight)  Electrolyte problems (mood changes, confusion, muscle pain or weakness, abnormal heartbeat, seizures, not hungry, or very bad upset stomach or throwing up)  Weakness on 1 side of the body, trouble speaking or thinking, change in balance, drooping on one side of the face, or blurred eyesight  Not able to focus; memory problems or loss  Any swelling (facial, peripheral, or periorbital edema)  Any signs of an infection - fever, chills, sore throat, etc???  Unexplained bruising or bleeding  Anxiety, bad dreams, trouble sleeping, change in eyesight, dizziness, feeling confused, feeling nervous or agitated, feeling restless, hallucinations (seeing or hearing things that are not there), new or worse behavior or mood changes like depression or thoughts of suicide, seizures, or very bad headaches.    Monitoring parameters:  Assess PDGFRA exon 18 mutation  Pregnancy test prior to starting treatment  Liver Function  CBC (platelet count)    Contraindications, Warnings, & Precautions     Females of reproductive potential and males with female partners of reproductive potential should use effective contraception during therapy and for 6 weeks after the last dose.  Breastfeeding is not recommended by the manufacturer during treatment and for 2 weeks after the last dose.    Drug/Food Interactions     Medication list reviewed in Epic. The patient was instructed to inform the care team before taking any new medications or supplements. No drug  interactions identified.     Storage, Handling Precautions, & Disposal     Store at room temperature in a dry place.  Keep all drugs out of the reach of children and pets.    Current Medications (including OTC/herbals), Comorbidities and Allergies     Current Outpatient Medications   Medication Sig Dispense Refill    avapritinib (AYVAKIT) 25 mg tablet Take 1 tablet (25 mg total) by mouth daily. Take on an empty stomach, at least 1 hour before or 2 hours after a meal. 30 tablet 5 cetirizine (ZYRTEC) 10 MG tablet Take 1 tablet (10 mg total) by mouth two (2) times a day. 60 tablet 11    EPINEPHrine (EPIPEN) 0.3 mg/0.3 mL injection Inject 0.3 mL (0.3 mg total) into the muscle once for 1 dose. 0.3 mL 2    famotidine (PEPCID) 20 MG tablet Take 1 tablet (20 mg total) by mouth two (2) times a day. 60 tablet 11    fluticasone propionate (FLONASE) 50 mcg/actuation nasal spray 1 spray by Miscellaneous route daily.      ibuprofen 200 mg cap Take 600 mg by mouth.      progesterone (PROMETRIUM) 100 MG capsule TAKE 1 CAPSULE BY MOUTH ON DAYS 1-14 OF CYCLE 30 DAYS       No current facility-administered medications for this visit.       Allergies   Allergen Reactions    Azo Urinary Tract Defense [Methenamine-Sodium Salicylate] Anaphylaxis    Cranberry Other (See Comments) and Palpitations     Other reaction(s): Other (See Comments)    Other Anaphylaxis     I had local anesthesia at the dentist that made my throat swell Pt reports swelling, pain, blacking-out. Pt reports that she has no reaction to lidocaine    Phenazopyridine Nausea And Vomiting and Shortness Of Breath     Elevated heart rate. Possible seizure.  Elevated heart rate. Possible seizure.  Elevated heart rate. Possible seizure.  Elevated heart rate. Possible seizure.  Elevated heart rate. Possible seizure.  Elevated heart rate. Possible seizure.  Elevated heart rate. Possible seizure.  Elevated heart rate. Possible seizure.      Anesthetics - Amide Type - Select Amino Amides      Local Anesthetics, causes swelling, pain, itching, rhinitis     Sulfa (Sulfonamide Antibiotics) Other (See Comments)     Other reaction(s): Other (See Comments)  Gets bad yeast infection  Gets bad yeast infection  Gets bad yeast infection    Other reaction(s): Other (See Comments)  Gets bad yeast infection       There is no problem list on file for this patient.    Reviewed and up to date in Epic.    Appropriateness of Therapy     Acute infections noted within Epic: No active infections  Patient reported infection: None    Is medication and dose appropriate based on diagnosis and infection status? Yes    Prescription has been clinically reviewed: Yes    Baseline Quality of Life Assessment      How many days over the past month did your Cutaneous mastocytosis  keep you from your normal activities? For example, brushing your teeth or getting up in the morning. 0    Financial Information     Medication Assistance provided: Prior Authorization    Anticipated copay of $4 / 30 days reviewed with patient. Verified delivery address.    Delivery Information     Scheduled delivery date: 08/17/22    Expected  start date: ASAP    Medication will be delivered via UPS to the prescription address in [employer] [facility].  This shipment will not require a signature.      Explained the services we provide at [facility] and that each month we would call to set up refills.  Stressed importance of returning phone calls so that we could ensure they receive their medications in time each month.  Informed patient that we should be setting up refills 7-10 days prior to when they will run out of medication.  A pharmacist will reach out to perform a clinical assessment periodically.  Informed patient that a welcome packet, containing information about our pharmacy and other support services, a Notice of Privacy Practices, and a drug information handout will be sent.      The patient or caregiver noted above participated in the development of this care plan and knows that they can request review of or adjustments to the care plan at any time.      Patient or caregiver verbalized understanding of the above information as well as how to contact the pharmacy at [phone] option 4 with any questions/concerns.  The pharmacy is open Monday through Friday 8:30am-4:30pm.  A pharmacist is available 24/7 via pager to answer any clinical questions they may have.    Patient Specific Needs     Does the patient have any physical, cognitive, or cultural barriers? No    Does the patient have adequate living arrangements? (i.e. the ability to store and take their medication appropriately) Yes    Did you identify any home environmental safety or security hazards? No    Patient prefers to have medications discussed with  Patient     Is the patient or caregiver able to read and understand education materials at a high school level or above? Yes    Patient's primary language is  English     Is the patient high risk? No    SOCIAL DETERMINANTS OF HEALTH     At the [facility], we have learned that life circumstances - like trouble affording food, housing, utilities, or transportation can affect the health of many of our patients.   That is why we wanted to ask: are you currently experiencing any life circumstances that are negatively impacting your health and/or quality of life? Patient declined to answer    Social Determinants of Health     Financial Resource Strain: Not on file   Internet Connectivity: Not on file   Food Insecurity: Not on file   Tobacco Use: Not on file   Housing/Utilities: Not on file   Alcohol Use: Not on file   Transportation Needs: Not on file   Substance Use: Not on file   Health Literacy: Not on file   Physical Activity: Not on file   Interpersonal Safety: Not on file   Stress: Not on file   Intimate Partner Violence: Not on file   Depression: Not on file   Social Connections: Not on file     Would you be willing to receive help with any of the needs that you have identified today? Not applicable       Kermit Balo, [facility]  [facility] Specialty Pharmacist

## 2022-08-18 ENCOUNTER — Other Ambulatory Visit: Payer: Self-pay | Admitting: *Deleted

## 2022-08-18 DIAGNOSIS — D4709 Other mast cell neoplasms of uncertain behavior: Principal | ICD-10-CM

## 2022-08-18 DIAGNOSIS — D4701 Cutaneous mastocytosis: Secondary | ICD-10-CM

## 2022-08-18 NOTE — Progress Notes (Signed)
Placed lab orders per request of Dr. Rickard Patience at [facility].  Fax # [phone] and Phone [phone]

## 2022-08-25 ENCOUNTER — Inpatient Hospital Stay: Payer: BC Managed Care – PPO | Admitting: Nurse Practitioner

## 2022-08-26 ENCOUNTER — Inpatient Hospital Stay: Payer: BC Managed Care – PPO | Attending: Nurse Practitioner

## 2022-08-26 DIAGNOSIS — D4701 Cutaneous mastocytosis: Secondary | ICD-10-CM | POA: Diagnosis not present

## 2022-08-26 LAB — CBC WITH DIFFERENTIAL (CANCER CENTER ONLY)
Abs Immature Granulocytes: 0.01 10*3/uL (ref 0.00–0.07)
Basophils Absolute: 0 10*3/uL (ref 0.0–0.1)
Basophils Relative: 1 %
Eosinophils Absolute: 0.1 10*3/uL (ref 0.0–0.5)
Eosinophils Relative: 1 %
HCT: 38.9 % (ref 36.0–46.0)
Hemoglobin: 13.3 g/dL (ref 12.0–15.0)
Immature Granulocytes: 0 %
Lymphocytes Relative: 48 %
Lymphs Abs: 2.6 10*3/uL (ref 0.7–4.0)
MCH: 30 pg (ref 26.0–34.0)
MCHC: 34.2 g/dL (ref 30.0–36.0)
MCV: 87.8 fL (ref 80.0–100.0)
Monocytes Absolute: 0.4 10*3/uL (ref 0.1–1.0)
Monocytes Relative: 8 %
Neutro Abs: 2.3 10*3/uL (ref 1.7–7.7)
Neutrophils Relative %: 42 %
Platelet Count: 239 10*3/uL (ref 150–400)
RBC: 4.43 MIL/uL (ref 3.87–5.11)
RDW: 12.6 % (ref 11.5–15.5)
WBC Count: 5.5 10*3/uL (ref 4.0–10.5)
nRBC: 0 % (ref 0.0–0.2)

## 2022-08-26 LAB — CMP (CANCER CENTER ONLY)
ALT: 20 U/L (ref 0–44)
AST: 16 U/L (ref 15–41)
Albumin: 4.6 g/dL (ref 3.5–5.0)
Alkaline Phosphatase: 57 U/L (ref 38–126)
Anion gap: 6 (ref 5–15)
BUN: 11 mg/dL (ref 6–20)
CO2: 28 mmol/L (ref 22–32)
Calcium: 9.6 mg/dL (ref 8.9–10.3)
Chloride: 105 mmol/L (ref 98–111)
Creatinine: 0.83 mg/dL (ref 0.44–1.00)
GFR, Estimated: >60 mL/min (ref >60)
Glucose, Bld: 86 mg/dL (ref 70–99)
Potassium: 4 mmol/L (ref 3.5–5.1)
Sodium: 139 mmol/L (ref 135–145)
Total Bilirubin: 0.4 mg/dL (ref 0.3–1.2)
Total Protein: 7.1 g/dL (ref 6.5–8.1)

## 2022-08-26 LAB — LACTATE DEHYDROGENASE: LDH: 139 U/L (ref 98–192)

## 2022-08-29 LAB — TRYPTASE: Tryptase: 13.8 ug/L — ABNORMAL HIGH (ref 2.2–13.2)

## 2022-08-30 ENCOUNTER — Encounter: Payer: Self-pay | Admitting: *Deleted

## 2022-08-30 NOTE — Progress Notes (Signed)
Faxed labs from 08/26/22 to [facility] as requested [phone].

## 2022-08-31 ENCOUNTER — Institutional Professional Consult (permissible substitution): Admit: 2022-08-31 | Payer: PRIVATE HEALTH INSURANCE

## 2022-08-31 NOTE — Unmapped (Signed)
Eye watering with first dose, no increased GI upset, no cognitive symptoms/HA, no bleeding issues, noticed hands swollen one morning, no SOB, sleeping     Was itchy yesterday, no triggers, had more itching on face/chest/legs (not the usual spots) - other than yesterday it's been more manageable   No pain/flu like symptoms   Felt pretty good, energy/fatigue   Brain fog slightly improved   Symptoms tend to cycle with menstrual cycle (on progesterone for 14 days per cycle), starts sooner than day 14 sometime between D10-14; while on period symptoms    [facility]     2 missed doses in a row, but has been consistent otherwise Value Ref Range Status    Diagnosis 07/15/2022    Final                    Value:This result contains rich text formatting which cannot be displayed here.    Diagnosis Comment 07/15/2022    Final                    Value:This result contains rich text formatting which cannot be displayed here.    Clinical History 07/15/2022    Final                    Value:This result contains rich text formatting which cannot be displayed here.    Gross Description 07/15/2022    Final                    Value:This result contains rich text formatting which cannot be displayed here.    Microscopic Description 07/15/2022    Final                    Value:This result contains rich text formatting which cannot be displayed here.    Disclaimer 07/15/2022    Final                    Value:This result contains rich text formatting which cannot be displayed here.    Flow Cytometry Summary 07/15/2022    Final                    Value:Flow Cytometric Immunophenotyping Results:    Bone marrow aspirate  Heme WBC  17,500 cells/uL     Flow Differential (% of Total)  Viability:   N/A  Total of Markers Charged   15  Markers-1 Charged  14    Gated Population:  7% Lymphocytes    Description of Gated Cells (% of Gated)     B-Cell Markers:    CD19   19%  CD20   22%  Kappa   11%  Lambda        8%    T-Cell Markers:    CD2   78%  CD3   70%  CD3/CD4  48%  Total CD4  48%  CD5   68%  CD7   78%  CD3/CD8  18%  Total CD8  20%    Miscellaneous:    CD10    3%  CD38    75%  CD45    100%  CD57    8%  CD3-/CD16+56  11%  Total CD16+56  12%            Flow Cytometry Interpretation:  Flow cytometric analysis of the bone marrow aspirate reveals a cellular specimen (16109 cells/uL).    A population  of 7% cells is gated on the lymphocyte region and is composed of 70% T-cells (CD4: CD8 ratio is 2.6:1) without an aberrant immunophenotype, 19% polytypic B-cells, and 11% NK-cells.    Flow cytometric analysis fails to reveal a monotypic B-cell or overtly aberrant T-cell population.    This test, utilizing analyte-specific reagents (ASR) was developed and its performance characteristics determined by the [facility].  It has not been cleared or approved by the U.S. Food and Drug Administration (FDA).  The FDA has determined that such clearance or approval is not necessary.  This test is used for clinical purposes.  It should not be regarded as investigational or for research.       Cytogenetics Test, Other 07/15/2022 Collected   Final    Specimen Info 07/15/2022    Final                    Value:This result contains rich text formatting which cannot be displayed here.    Myeloid Mutation Panel - MDS & MPN 07/15/2022    Final                    Value:This result contains rich text formatting which cannot be displayed here.    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     The p.Asp816Val (D816V) mutation in the KIT gene is   identified in > 80% of systemic mastocytosis and less   commonly in cutaneous mastocytosis. It represents a minor   diagnostic criterion for systemic mastocytosis in the World   Health Organization [facility]) classification system.     Presence of the KIT D816V mutation confers resistance to   some tyrosine kinase inhibitors such as imatinib, nilotinib   and masitinib but has shown response to midostaurin (PMID:   16109604; [id number]; [id number]). Midostaurin has been   approved by the Food and Drug Administration (FDA) in adult   patients with advanced systemic mastocytosis irrespective   of D816V mutational status   (http://garrison-lawrence.org/  ugs/midostaurin).     -------------------ADDITIONAL INFORMATION-------------------  Method summary - A qualitative, allele-specific polymerase chain reaction                            a(PCR) was performed on DNA. This was used   to evaluate for the point mutation causing KIT Asp816Val   (D816V). The analytic sensitivity of this assay is 0.1%.  This test was developed and its performance characteristics   determined by [facility] in a manner consistent with CLIA   requirements. This test has not been cleared or approved by   the U.S. Food and Drug Administration.    KIT Asp816Val Signing Pathologist 07/15/2022 Clydie Braun L. Franne Grip, M.D.   Final       Test Performed by:  [facility]  [address] [place], [place], [place] [address]  Lab Director: Paul Dykes M.D. Ph.D.; CLIA# [id number]    Smear - Bone Marrow Patient 07/15/2022 Prepared slide and instrument printout routed to hemepath   Final    Specimen Type 07/15/2022    Final  Value:This result contains rich text formatting which cannot be displayed here.    RESULTS 07/15/2022    Final                    Value:This result contains rich text formatting which cannot be displayed here.    Interpretation 07/15/2022    Final                    Value:This result contains rich text formatting which cannot be displayed here.    Diagnosis Comment 07/15/2022    Final                    Value:This result contains rich text formatting which cannot be displayed here.    No. Cells 07/15/2022 20   Final    Culture(s) Examined 07/15/2022 2   Final    Cells Analyzed 07/15/2022 18   Final    Cells Karyotyped 07/15/2022 2   Final    Stain(s) Used 07/15/2022 G-Bands   Final    Indication for Study 07/15/2022    Final                    Value:This result contains rich text formatting which cannot be displayed here.       Allergies:   Allergies   Allergen Reactions    Azo Urinary Tract Defense [Methenamine-Sodium Salicylate] Anaphylaxis    Cranberry Other (See Comments) and Palpitations     Other reaction(s): Other (See Comments)    Other Anaphylaxis     I had local anesthesia at the dentist that made my throat swell Pt reports swelling, pain, blacking-out. Pt reports that she has no reaction to lidocaine    Phenazopyridine Nausea And Vomiting and Shortness Of Breath     Elevated heart rate. Possible seizure.  Elevated heart rate. Possible seizure.  Elevated heart rate. Possible seizure.  Elevated heart rate. Possible seizure.  Elevated heart rate. Possible seizure.  Elevated heart rate. Possible seizure.  Elevated heart rate. Possible seizure.  Elevated heart rate. Possible seizure.      Anesthetics - Amide Type - Select Amino Amides      Local Anesthetics, causes swelling, pain, itching, rhinitis     Sulfa (Sulfonamide Antibiotics) Other (See Comments)     Other reaction(s): Other (See Comments)  Gets bad yeast infection  Gets bad yeast infection  Gets bad yeast infection    Other reaction(s): Other (See Comments)  Gets bad yeast infection       Drug Interactions: major CYP3A4 substrate, avoid 3A4 inh/ind      Current Medications:  Current Outpatient Medications   Medication Sig Dispense Refill    avapritinib (AYVAKIT) 25 mg tablet Take 1 tablet (25 mg total) by mouth daily. Take on an empty stomach, at least 1 hour before or 2 hours after a meal. 30 tablet 5    cetirizine (ZYRTEC) 10 MG tablet Take 1 tablet (10 mg total) by mouth two (2) times a day. 60 tablet 11    EPINEPHrine (EPIPEN) 0.3 mg/0.3 mL injection Inject 0.3 mL (0.3 mg total) into the muscle once for 1 dose. 0.3 mL 2    famotidine (PEPCID) 20 MG tablet Take 1 tablet (20 mg total) by mouth two (2) times a day. 60 tablet 11    fluticasone propionate (FLONASE) 50 mcg/actuation nasal spray 1 spray by Miscellaneous route daily.      ibuprofen 200 mg cap Take 600 mg by  mouth.      progesterone (PROMETRIUM) 100 MG capsule TAKE 1 CAPSULE BY MOUTH ON DAYS 1-14 OF CYCLE 30 DAYS       No current facility-administered medications for this visit.       Adherence: No barriers identified; she did miss 2 doses. One she forgot, the other she had eaten late and then forgot to wake up and take it (attempting to take on empty stomach). [name] AUC is increased with a high fat meal, putting her at risk of toxicity. However we discussed this dose is very low, so if it's infrequent ok to take if food has been consumed in last 1-2 hours. (Of note studies with food were high fat/calorie > 900 cal); I would rather her not miss doses       Assessment: Beverly Wilson is a 36 y.o. female with  systemic mastocytosis  being treated with avapritinib. [name] so far has been well tolerated and     Plan:   - Continue avapritinib 25 mg daily   - Can continue usual supportive care, prn for existing symptoms and monitor for improvement   - RTC 12/20 for labs and visit with MD    F/u:  Future Appointments   Date Time Provider Department Center   09/15/2022  1:30 PM ADULT ONC LAB [facility]   09/15/2022  2:40 PM Halford Decamp, MD [id number]   11/01/2022 12:00 PM ADULT [facility]   11/01/2022  1:00 PM Sawchak, Adine Madura, AGNP [id number]       I spent 20 minutes with Beverly Wilson in direct patient care.      Manfred Arch, PharmD, BCOP, CPP  Pager: [phone]

## 2022-09-06 NOTE — Unmapped (Signed)
[facility] Clinical Assessment & Refill Coordination Note    Beverly Wilson, DOB: 24-Dec-1985  Phone: [phone] (home)     All above HIPAA information was verified with patient.     Was a Nurse, learning disability used for this call? No    Specialty Medication(s):   Hematology/Oncology: Ayvakit 25 mg     Current Outpatient Medications   Medication Sig Dispense Refill    avapritinib (AYVAKIT) 25 mg tablet Take 1 tablet (25 mg total) by mouth daily. Take on an empty stomach, at least 1 hour before or 2 hours after a meal. 30 tablet 5    cetirizine (ZYRTEC) 10 MG tablet Take 1 tablet (10 mg total) by mouth two (2) times a day. 60 tablet 11    EPINEPHrine (EPIPEN) 0.3 mg/0.3 mL injection Inject 0.3 mL (0.3 mg total) into the muscle once for 1 dose. 0.3 mL 2    famotidine (PEPCID) 20 MG tablet Take 1 tablet (20 mg total) by mouth two (2) times a day. 60 tablet 11    fluticasone propionate (FLONASE) 50 mcg/actuation nasal spray 1 spray by Miscellaneous route daily.      ibuprofen 200 mg cap Take 600 mg by mouth.      progesterone (PROMETRIUM) 100 MG capsule TAKE 1 CAPSULE BY MOUTH ON DAYS 1-14 OF CYCLE 30 DAYS       No current facility-administered medications for this visit.        Changes to medications: Nickey reports no changes at this time.    Allergies   Allergen Reactions    Azo Urinary Tract Defense [Methenamine-Sodium Salicylate] Anaphylaxis    Cranberry Other (See Comments) and Palpitations     Other reaction(s): Other (See Comments)    Other Anaphylaxis     I had local anesthesia at the dentist that made my throat swell Pt reports swelling, pain, blacking-out. Pt reports that she has no reaction to lidocaine    Phenazopyridine Nausea And Vomiting and Shortness Of Breath     Elevated heart rate. Possible seizure.  Elevated heart rate. Possible seizure.  Elevated heart rate. Possible seizure.  Elevated heart rate. Possible seizure.  Elevated heart rate. Possible seizure.  Elevated heart rate. Possible seizure.  Elevated heart rate. Possible seizure.  Elevated heart rate. Possible seizure.      Anesthetics - Amide Type - Select Amino Amides      Local Anesthetics, causes swelling, pain, itching, rhinitis     Sulfa (Sulfonamide Antibiotics) Other (See Comments)     Other reaction(s): Other (See Comments)  Gets bad yeast infection  Gets bad yeast infection  Gets bad yeast infection    Other reaction(s): Other (See Comments)  Gets bad yeast infection       Changes to allergies: No    SPECIALTY MEDICATION ADHERENCE     Ayvakit 25 mg: 12 days of medicine on hand     Medication Adherence    Patient reported X missed doses in the last month: 1-2  Specialty Medication: Ayvakit 25 mg once daily  Patient is on additional specialty medications: No  Informant: patient                  Confirmed plan for next specialty medication refill: delivery by pharmacy  Refills needed for supportive medications: not needed          Specialty medication(s) dose(s) confirmed: Regimen is correct and unchanged.     Are there any concerns with adherence? No  Adherence counseling provided? Not needed    CLINICAL MANAGEMENT AND INTERVENTION      Clinical Benefit Assessment:    Do you feel the medicine is effective or helping your condition? Yes    Clinical Benefit counseling provided? Not needed    Adverse Effects Assessment:    Are you experiencing any side effects? No    Are you experiencing difficulty administering your medicine? No    Quality of Life Assessment:    Quality of Life    Rheumatology  Oncology  1. What impact has your specialty medication had on the reduction of your daily pain or discomfort level?: None  2. On a scale of 1-10, how would you rate your ability to manage side effects associated with your specialty medication? 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 For example, brushing your teeth or getting up in the morning. 0    Have you discussed this with your provider? Not needed    Acute Infection Status:    Acute infections noted within Epic:  No active infections  Patient reported infection: None    Therapy Appropriateness:    Is therapy appropriate and patient progressing towards therapeutic goals? Yes, therapy is appropriate and should be continued    DISEASE/MEDICATION-SPECIFIC INFORMATION      N/A    Oncology: Is the patient receiving adequate infection prevention treatment? Not applicable  Does the patient have adequate nutritional support? Not applicable    PATIENT SPECIFIC NEEDS     Does the patient have any physical, cognitive, or cultural barriers? No    Is the patient high risk? No    Did the patient require a clinical intervention? No    Does the patient require physician intervention or other additional services (i.e., nutrition, smoking cessation, social work)? No    SOCIAL DETERMINANTS OF HEALTH     At the [facility], we have learned that life circumstances - like trouble affording food, housing, utilities, or transportation can affect the health of many of our patients.   That is why we wanted to ask: are you currently experiencing any life circumstances that are negatively impacting your health and/or quality of life? No    Social Determinants of Psychologist, prison and probation services Strain: Not on file   Internet Connectivity: Not on file   Food Insecurity: Not on file   Tobacco Use: Not on file   Housing/Utilities: Not on file   Alcohol Use: Not on file   Transportation Needs: Not on file   Substance Use: Not on file   Health Literacy: Not on file   Physical Activity: Not on file   Interpersonal Safety: Not on file   Stress: Not on file   Intimate Partner Violence: Not on file   Depression: Not on file   Social Connections: Not on file     Would you be willing to receive help with any of the needs that you have identified today? Not applicable     SHIPPING     Specialty Medication(s) to be Shipped:   Hematology/Oncology: Ayvakit 25 mg    Other medication(s) to be shipped: No additional medications requested for fill at this time     Changes to insurance: No    Delivery Scheduled: Yes, Expected  medication delivery date: 09/15/22.     Medication will be delivered via UPS to the confirmed prescription address in [facility] [place].    The patient will receive a drug information handout for each medication shipped and additional FDA Medication Guides as required.  Verified that patient has previously received a Conservation officer, historic buildings and a Surveyor, mining.    The patient or caregiver noted above participated in the development of this care plan and knows that they can request review of or adjustments to the care plan at any time.      All of the patient's questions and concerns have been addressed.    Kermit Balo, [facility]   [facility] Specialty Pharmacist

## 2022-09-14 MED FILL — AYVAKIT 25 MG TABLET: ORAL | 30 days supply | Qty: 30 | Fill #1

## 2022-09-28 NOTE — Unmapped (Signed)
Hi Dr.Van Jerilee Hoh,    Patient Beverly Wilson contacted the Communication Center to cancel their appointment for tomorrow.  The appointment has been cancelled.    Cancellation Reason: Patient will be out of town tomorrow. She will see Sawchak on 11/01/22.     Thank you,  Durward Fortes  [facility]   [phone]

## 2022-10-12 NOTE — Unmapped (Signed)
[facility] Refill Coordination Note    Specialty Medication(s) to be Shipped:   Hematology/Oncology: Ayvakit    Other medication(s) to be shipped: No additional medications requested for fill at this time     Beverly Wilson, DOB: 1986/06/18  Phone: [phone] (home)       All above HIPAA information was verified with patient.     Was a Nurse, learning disability used for this call? No    Completed refill call assessment today to schedule patient's medication shipment from the [facility] [phone]).  All relevant notes have been reviewed.     Specialty medication(s) and dose(s) confirmed: Regimen is correct and unchanged.   Changes to medications: [name] reports no changes at this time.  Changes to insurance: No  New side effects reported not previously addressed with a pharmacist or physician: None reported  Questions for the pharmacist: No    Confirmed patient received a Conservation officer, historic buildings and a Surveyor, mining with first shipment. The patient will receive a drug information handout for each medication shipped and additional FDA Medication Guides as required.       DISEASE/MEDICATION-SPECIFIC INFORMATION        N/A    SPECIALTY MEDICATION ADHERENCE     Medication Adherence    Patient reported X missed doses in the last month: 0  Specialty Medication: Ayvakit 25 mg  Patient is on additional specialty medications: No  Informant: patient                       Were doses missed due to medication being on hold? No    Ayvakit 25 mg: 7 days of medicine on hand       REFERRAL TO PHARMACIST     Referral to the pharmacist: Not needed      [facility]     Shipping address confirmed in Epic.     Delivery Scheduled: Yes, Expected medication delivery date: 10/15/22.     Medication will be delivered via UPS to the prescription address in Epic [place].    [name] [name]   [facility] Specialty Technician

## 2022-10-14 MED FILL — AYVAKIT 25 MG TABLET: ORAL | 30 days supply | Qty: 30 | Fill #2

## 2022-11-01 ENCOUNTER — Ambulatory Visit
Admit: 2022-11-01 | Discharge: 2022-11-01 | Payer: PRIVATE HEALTH INSURANCE | Attending: Adult Health | Primary: Adult Health

## 2022-11-01 ENCOUNTER — Other Ambulatory Visit: Admit: 2022-11-01 | Discharge: 2022-11-01 | Payer: PRIVATE HEALTH INSURANCE

## 2022-11-01 DIAGNOSIS — D4701 Cutaneous mastocytosis: Principal | ICD-10-CM

## 2022-11-01 DIAGNOSIS — R29898 Other symptoms and signs involving the musculoskeletal system: Principal | ICD-10-CM

## 2022-11-01 LAB — COMPREHENSIVE METABOLIC PANEL
ALBUMIN: 4.2 g/dL (ref 3.4–5.0)
ALKALINE PHOSPHATASE: 65 U/L (ref 46–116)
ALT (SGPT): 15 U/L (ref 10–49)
ANION GAP: 3 mmol/L — ABNORMAL LOW (ref 5–14)
AST (SGOT): 16 U/L (ref <=34)
BILIRUBIN TOTAL: 0.4 mg/dL (ref 0.3–1.2)
BLOOD UREA NITROGEN: 9 mg/dL (ref 9–23)
BUN / CREAT RATIO: 12
CALCIUM: 9 mg/dL (ref 8.7–10.4)
CHLORIDE: 110 mmol/L — ABNORMAL HIGH (ref 98–107)
CO2: 25 mmol/L (ref 20.0–31.0)
CREATININE: 0.75 mg/dL
EGFR CKD-EPI (2021) FEMALE: >90 mL/min/{1.73_m2} (ref >=60)
GLUCOSE RANDOM: 84 mg/dL (ref 70–179)
POTASSIUM: 3.6 mmol/L (ref 3.4–4.8)
PROTEIN TOTAL: 7.1 g/dL (ref 5.7–8.2)
SODIUM: 138 mmol/L (ref 135–145)

## 2022-11-01 LAB — CBC W/ AUTO DIFF
BASOPHILS ABSOLUTE COUNT: 0 10*9/L (ref 0.0–0.1)
BASOPHILS RELATIVE PERCENT: 0.4 %
EOSINOPHILS ABSOLUTE COUNT: 0 10*9/L (ref 0.0–0.5)
EOSINOPHILS RELATIVE PERCENT: 0.4 %
HEMATOCRIT: 38 % (ref 34.0–44.0)
HEMOGLOBIN: 13.1 g/dL (ref 11.3–14.9)
LYMPHOCYTES ABSOLUTE COUNT: 2.5 10*9/L (ref 1.1–3.6)
LYMPHOCYTES RELATIVE PERCENT: 39.1 %
MEAN CORPUSCULAR HEMOGLOBIN CONC: 34.4 g/dL (ref 32.0–36.0)
MEAN CORPUSCULAR HEMOGLOBIN: 30.3 pg (ref 25.9–32.4)
MEAN CORPUSCULAR VOLUME: 88.2 fL (ref 77.6–95.7)
MEAN PLATELET VOLUME: 9 fL (ref 6.8–10.7)
MONOCYTES ABSOLUTE COUNT: 0.3 10*9/L (ref 0.3–0.8)
MONOCYTES RELATIVE PERCENT: 5.5 %
NEUTROPHILS ABSOLUTE COUNT: 3.5 10*9/L (ref 1.8–7.8)
NEUTROPHILS RELATIVE PERCENT: 54.6 %
PLATELET COUNT: 212 10*9/L (ref 150–450)
RED BLOOD CELL COUNT: 4.31 10*12/L (ref 3.95–5.13)
RED CELL DISTRIBUTION WIDTH: 13.7 % (ref 12.2–15.2)
WBC ADJUSTED: 6.3 10*9/L (ref 3.6–11.2)

## 2022-11-01 LAB — LACTATE DEHYDROGENASE: LACTATE DEHYDROGENASE: 144 U/L (ref 120–246)

## 2022-11-01 NOTE — Unmapped (Signed)
Patient stated that she is having some pain in her right ear, left hand and arm. The ear really hurts. She can't hardly grab things or pick up. The arm and hand do not have as much feeling and sensation as it use to.

## 2022-11-01 NOTE — Unmapped (Signed)
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 with MP lesions with an increased number of mast cells in both Beverly Wilson skin and bone marrow.  Like many patients wit mast cell disorders, she does not fit one diagnosis perfectly.  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  80% of patients with MPCM have systemic mastocytosis (SM) or will develop it.  Beverly Wilson is very close to fulfilling all 3 of the 4 minor criteria.  (She would meet the criteria IF Beverly Wilson tryptase was > 20).  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     Since starting avapritinib, she has felt somewhat better.  Beverly Wilson energy has improved.  However, Beverly Wilson improvement has been somewhat limited by relatively new onset progressive weakness in Beverly Wilson left hand and arm, as well as Beverly Wilson left leg.  She feels well enough to start being active again, but has difficulty doing these things. She is unable to always open the door to the refrigerator because of Beverly Wilson grip strength and pull  strength.  On the treadmill, she has to stop after about 10 minutes.  On exam, the extension of Beverly Wilson left wrist seems to be weakest, as well as extension of Beverly Wilson elbow, and abduction of the elbow.  It may be that she has some radial nerve compression.  We do not think it is related to Beverly Wilson mast cell disease.  Discussed that a PM&R referral would be most appropriate to evaluate.    We are still awaiting tryptase level.  If still elevated, may consider increasing avapritinib, but for now will remain on 25mg .    PLAN:  1) Continue avapritinib 25mg  daily  2) Continue anti H1, H2 blockade at BID dosing. She can increase this to QID.   3) Retrial famotidine 20mg  BID  3) Trial sudafed for ear pressure; no signs of infection   4) Avoidance: Consider seeing an allergist for venoms (bees, wasps, fire ants) testing since she has a significant exposure risk.   5) Other treatments to consider:   Venlafaxine - this is helpful for flushing  Ketotifen: 1-2 mg BID to 2-4 mg QID - This may be helpful for CNS sx  Disodium Cromoglycate: 200 TID-QID - this is helpful for GI sx.  It can also be compounded into an emulsion for the skin and a nebulizer for respiratory sx  Xolair - This is an effective treatment for several of MC sx. However, it is parenterally administered.   6) RTC in 2 months, sooner if needed    Beverly Jarvis, RN, MSN, AGPCNP-C  Nurse Practitioner  Hematologic Malignancies  [facility]  11/01/2022    I personally spent 65 minutes face-to-face and non-face-to-face in the care of this patient, which includes all pre, intra, and post visit time on the date of service.  All documented time was specific to the E/M visit and does not include any procedures that may have been performed.    Surgery Recommendations   Prednisone 50 mg on the morning of and 50 mg if there are continued sx.   30 to 60 minutes pre-op  H1/H2 blockade (certerizine, famotidine)   Benzodiazepine (there is  data suggesting anxiety adds to mast cell activation)  Standard DVT prophylaxis  Operative  Avoid Atracuronium and rocuronium  Succinylcholine, cis-atracurium are safest  Avoid Morphine, codeine  Fentanyl is safer   Avoid cold temperatures    HEME HX:  2013: She developed skin lesions on Beverly Wilson thigh; these were noticeable when exposed to  in hot water or exercise.  With exercise, she noted getting flushed and having some wheezing    2022: Develops MP on Beverly Wilson trunk and arms. She notes multiple flushing episodes a day.   04/15/22: Presents to a dermatologist as Beverly Wilson skin lesions begin to proliferate.  She undergoes a skin biopsy showing:   An atypical mast cell infiltrate is present in the upper dermis  MC are positive for CD117 and MCT but negative for CD2 and CD30.   Overall, it is an atypical infiltrate that is compatible with mastocytosis.   No aberrant antigen expression is identified to confirm that the mast cells are neoplastic and clonal.   04/2022: She was seen by an outside hematologist.  They noted the following:   Intermittent fatigue  Deep bone pain  DOE and chest heaviness  Difficulties focusing  Periodic itching in Beverly Wilson lips, tongue, mouth   IBS sx  Tryptase of 17.2  06/29/22: Seen by [name]; BM Bx:   Tryptase: 13  60% cellular marrow with TLH  5% atypical mast cells by CD117 immunohistochemical stain (See Comment)  Scattered mast cells with atypical features (>50% spindling)   IHC fails to show aberrant CD2 or CD30 antigen expression.  MPN Panel: Negative  KITD816V: Positive  08/17/22: Started avapritinib 25mg  PO daily  11/01/22: tryptase pending    INTERVAL HX:   Reports a headaches for about 2 weeks, episodes where Beverly Wilson left arm becomes weak and she may possibly drop things.  Used to be intermittent until the last few months it is almost all the time.  Does report some possible loss of sensation.  Today Beverly Wilson right ear and face is hurting.  Some congestion.    [name] didn't seem to help much; took for a few weeks.  Reports increased energy since starting the avaykit, but with recent arm pain and weakness, she is unable to do those things.  No trauma that she has not  Left foot feels heavier, especially while on the treadmill.  Can be on the treadmill for about 10 minutes before Beverly Wilson leg starts to feel numb.     No notable side effects from the ayvakit.  DOes still report some itchiness when she gets hot.  However, the spots do not raise up as much as they used to.  Gets very cold after eating dinner.  Sometimes she'll feel congested after eating, like she ate something that's giving Beverly Wilson a reaction.  Feels like brain fog is better, but does experience some expressive aphasia.  Eliminating processed meats, which previously gave Beverly Wilson a headache. When she was on famotidine, she felt like she had more reflux.  Slightly improved when decreasing the dose, but then it did not help with the mast cell symptoms.        Modified AFIRMM questionnaire  Score each subsection separately  In the past 4 weeks ???.   # Handicap Grade description Grd Wt Pts  11/01/22   Cutaneous 1 Pruritus (itching) Not affected  Occasionally (3 or fewer times a week)  Frequently (4 or more times a week)  Frequently and disruptive to daily function  Frequently and disruptive to sleep  0  1  2  3  4 1  2  3  4  5  0  2  6  12  20 6  Mostly back   (Worsened by heat, exercise, feels like hives, spots raise up)   Worse after showers    6    2 Flushing Not affected  Occasionally (once or twice a day)  Frequently (3 or more times a day)   Frequently and disruptive to daily function  Frequently and disruptive to sleep   0  1  2  3  4 1  2  3  4  5  0  2  6  12  20 6 12    3  Cutaneous mast cell lesions  Not affected  Mild embarrassment to show one's body  Moderate embarrassment to show one's body  Severe embarrassment to show one's body  Body not shown any more 0  1  2  3  4 1  2  3  4  5  0  2  6  12  20  Now on chest   6 0    4 Anaphylactic shock, syncope, dizziness Not affected  I have had 1 pre-syncopal event   I have had 2 pre-syncopal events  I have had > 2 pre-syncopal events  I had an episode of anaphylaxis  0  1  2  3  4 1  2  3  4  5  0  2  6  12  20  Shock - first time 2014 AZO took fire dept came up - AZO again passed out, to hospital.   Feels like that with macrobid - discolors urine - Surgery - hydrocodone  No stings ([place] outside) 2   Did feel pre-syncopal after taking a MVI, but then drank some cold water and felt better. Took another zyrtec at that point.      5 Dizziness with palpitations Not affected  Occasionally (3 or fewer times a week)  Frequently (4 or more times a week)  Frequently and disruptive to daily function  Frequently and disruptive to sleep   0  1  2  3  4 1  2  3  4  5  0  2  6  12  20 2   It happens when warm   This summer was the worst  0   GI   6 Aerophagia, eructation  (Burping) Not affected  Occasionally (less than once a day)  Frequently (more than once a day)   Frequently and disruptive to social function  Frequently and disruptive to sleep   0  1  2  3  4 1  2  3  4  5  0  2  6  12  20  0 0    7 Nausea, vomiting Not affected  Occasionally (less than once a day)  Frequently (more than once a day)   Frequently and disruptive to social function  Frequently and disruptive to sleep   0  1  2  3  4 1  2  3  4  5  0  2  6  12  20  0 0    8 Epigastric pain Not affected  Occasionally (less than once a day)  Frequently (more than once a day)   Frequently and disruptive to social function  Frequently and disruptive to sleep   0  1  2  3  4 1  2  3  4  5  0  2  6  12  20  0 0    9 Diarrhea Not affected  1 - 2 in a day  3 - 4 in a day  5 - 8 in a day  9 or more in a day 0  1  2  3  4 1  2  3  4  5  0  2  6  12  20  Go through phases const/diarrhea  Decades  Difficult with defecation   2 to 3 weeks   No colonoscopy  0    10 Pseudo-occlusive syndrome (distended abdomen, sometimes painful, with need for flatulence without success) Not affected  Occasionally (less than once a day)  Frequently (more than once a day)   Frequently and disruptive to social function  Frequently and disruptive to sleep   0  1  2  3  4 1  2  3  4  5  0  2  6  12  20 2   Somedays it is worse  2   General Function 11 Asthenia (fatigue) Not affected  Mild   Moderate  Severe  Intolerable 0  1  2  3  4 1  2  3  4  5  0  2  6  12  20 12  - somedays everything is wrong - over the summer almost every day.   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  Exercise: Some cardio   20% norlmal   2 kids  12  Has a hard time with computer work - words move on the screen  Spots in Beverly Wilson eyes affect ability as well    13 Social interaction (irritability, resistance to stress) Not affected  Mild difficulties to interact with others   Moderate difficulties to interact with others   Severe difficulties to interact with others   Cannot interact with the others 0  1  2  3  4 1  2  3  4  5  0  2  6  12  20 2   Sometimes - brain fog - forget  2    14 Depression Not affected  Mild depression  Moderate depression  Severe depression  Severe depression with suicide attempt 0  1  2  3  4 1  2  3  4  5  0  2  6  12  20  0 2    15 Memory loss (ability to remember names or words) Not affected  Mild  Moderate  Severe  Intolerable 0  1  2  3  4 1  2  3  4  5  0  2  6  12  20 6 6  reports worsening;  STM, has a hard time recognizing places or how to get places   Respiratory 16 Cough Not affected  Occasionally (less than once a day)  Frequently (more than once a  day)   Frequently and disruptive to social function  Frequently and disruptive to sleep   0  1  2  3  4 1  2  3  4  5  0  2  6  12  20  0  Occasionally mucusy  0    17 Dyspnea, asthma Not affected  Occasionally (less than once a day)  Frequently (more than once a day)   Frequently and impairs function  Permanently disabling  0  1  2  3  4 1  2  3  4  5  0  2  6  12  20  0 2 - DOE, worse when using strong smelling cleaning products    18 Ear/nose/throat inflammation Not affected  Mild (Controlled with PRN medications)  Moderate (Controlled with 1 or 2 daily meds)  Severe (Controlled with > 2 daily meds)  Intolerable (Not controlled) 0  1  2  3  4 1  2  3  4  5  0  2  6  12  20 6   Allergy meds - 1 year   6     TOTAL     54 58      Food allergy  Not affected  I avoid < 2 types of food  I avoid 2 to 4 types of food  I avoid 5 to 8 types of food withdrawn  I avoid more than 8 types of food withdrawn 0  1  2  3  4 1  2  3  4  5  0  2  6  12  20 2     Drug allergy  Not affected  I avoid < 2 types of medications  I avoid 2 to 4 types of medications  I avoid 5 to 8 types of medications   I avoid more than 8 types of medications  0  1  2  3  4 1  2  3  4  5  0  2  6  12  20 2      Physical Exam:    PMHx:   Anemia  Endometriosis  Migraines  Ovarian cyst  Seasonal allergies    PSHx:  CESAREAN SECTION 07/17/2011  UMBILICAL HERNIA REPAIR N/A 03/05/2014    MEDs:   ibuprofen (ADVIL,MOTRIN) 600 MG tablet, Take 1 tablet (600 mg total) by mouth every 6 (six) hours as needed., Disp: 30 tablet, Rfl: 0  Progesterone (PROMETRIUM) 200 MG capsule, Take 200 mg by mouth daily. Takes for 2 weeks every month then stops, Disp: , Rfl:  Multiple Vitamin (MULTIVITAMIN) tablet, Take 1 tablet by mouth daily. (Patient not taking: Reported on 05/05/2022), Disp: , Rfl: :    ALLERGIES:   Azo [Phenazopyridine] Shortness Of Breath and Nausea And Vomiting  Elevated heart rate. Possible seizure.  Other Anaphylaxis  I had local anesthesia at the dentist that made my throat swell Pt reports swelling, pain, blacking-out. Pt reports that she has no reaction to lidocaine  Sulfa Antibiotics Other    FHx: Mother and father overall in good health. Sister has psoriasis and either psoriatic arthritis or rheumatoid arthritis, receiving a biologic treatment. Brother in good health.    GYN HX: 3 pregnancies, 2 living children  Subchorionic hem and placental abruption at 20 weeks; she delivered at 26 wks delivered. The child died within 24 hours     SHx: She is married. 50-year-old son is currently undergoing evaluation for autoimmune condition. 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 No tobacco  use. Social EtOH intake.     Systemic Mastocytosis: 1 Major OR 3 minor  Major Criteria: Multifocal dense infiltrates of tryptase- and/or CD117 positive mast cells (?15 mast cells in aggregates) detected in sections of bone marrow and/or other extracutaneous organ(s)  Staining must include tryptase and CD117 (so as not to miss [facility]) She does not meet this criteria  Minor:      (i) > 25% of MC show an abnormal morphology (atypical immature or spindle-shaped) in the bone marrow or extracutaneous organ(s) She meets this criterion     (ii) Any activating KIT mutation in the bone marrow, peripheral blood, or extracutaneous organ She meets this criterion     (iii) MC in the bone marrow or in another extracutaneous organ that express CD25, CD2 and/or CD30 She does not meet this criterion     (iv) Serum tryptase > 20 ng /mL; (this should be adjusted for hereditary alpha-tryptasemia She is close to this (17.2)  Must rule out AMN - Systemic Mastocytosis with an Associated Myeloid Neoplasm This has been ruled out  The identification of a tyrosine kinase fusion gene with eosinophilia rules on SM This has been ruled out    Subtypes of SM  Bone Marrow Mastocytosis: No cutaneous lesions or B findings; erum tryptase < 125.    Indolent: No B or C findings  Smoldering: 2 of 3 B findings and no C findings (clinical outcome may not be that different from indolent)  Aggressive: 1 or more C findings  Well-differentiated:   Mast Cell Leukemia     Prognosis for SM: Myeloid mutation testing  ASXL1 and SRSF2 have a poorer prognosis  3 or more mutations have a worse prognosis  TET2 acts in synergy with KIT mutations  http://www.nature.com/leu/journal/v29/n5/full/leu20154a.html?foxtrotcallback=true    Multi-lineage KIT mutation suggests a MDS/MPN; thus, PB testing may be more informative   The VAF for KIT is prognostic.  Reduction in KIT to a VAF < 20% is a good prognostic sign for prolonged remission after cytoreductive therapy.      Cutaneous Mastocytosis: 1 Major and 1 minor or 2 minor  Major: Typical skin lesion with Darier's sign  Minor:   (i) Increased number of mast cells on biopsy She meets this criteria   (ii) Activating KIT mutation She meets this criteria    Alternative Diagnostic Criteria:   Major:   (i) D816V mutation on paraffin block  (ii) > 27 MC/HPF   Minor   (i) > 12 MC/HPF   (ii) Interstitial MC   (iii) Clusters and basal pigmentation    Maculopapular cutaneous mastocytosis  Monomorphic  Smaller lesions that may or may not itch  Trunk, proximal extremities  80% KITD816V mutated  50% atopy including anaphylaxis  Usually adults  2-6 mm without sharp borders  Most have SM  Dermatographism is considered a poorer prognostic sign  Darier's may be negative if the patient is on anti-histamines  Polymorphic  Variable size, shape, color  Usu kids  Usu head, neck, and extremities    Mayo Alliance Prognostic System (MAPS, Pardanani et al 2018)   https://ashpublications.org/bloodadvances/article/11/17/[email or web address]/16221/Mayo-alliance-prognostic-system-for-mastocytosis    Advanced SM vs ISM/SSM (HR, 4.0; 2 points):    Age >60 years (HR, 2.2; 1 point):     Platelets <150 ?? 109/L (HR, 2.8; 1 point):     Serum ALP above normal range (HR, 2.1; 1 point):    Adverse mutations (HR, 2.6; 1 point):     Total    Median Survival in mths by  Points  Low:  < 2 pts: NR  Int 1:  3 pts:   85  Int 2: 4 pts:   30  High: > 5 pts  12 mtns     Prognosis for Indolent Systemic Mastocytosis Maryelizabeth Kaufmann et al, Blood 2019)  One point for each   B22m > 2.5 mcg/mL      BM KIT D816V VAF > 1%     ASXL1/RUNX1/DNMT3A > 30%     Progression Free Survival  0 pt: NR (100% at 20 yr)  1 pt:  NR (80% at 20 yr)  2 pt:  10 yrs  3 pt  2 yrs    Prognosis for Systemic Mastocytosis (Jawhar, 2016)  One point for each  Spleen (>450 ml):    Alk Phos > 150:     5 yr OS  0 pt: 100%  1 pt: 75-80%  2 pts: 10-15%    There are now 5 prognostic systems for SM: REMA, MAPS, IPSM, MARS, GPS: They use WHO defn of adv SM, age > 60, Anemia < 10 or 11; TP < 100 or 150; WBC > 16; inc tryptase, B2M, Alp Phos, KIT VAF, and additional somatic mutations.     Grading Symptoms  Grade Definition  0 = no symptoms:  Prophylaxis only, no specific therapy required  1 = mild, infrequent: Prophylaxis ?? as needed therapy  2 = moderate:  Requires therapy, can usually be kept under control  3 = severe:  suboptimal or unsatisfactory control w/ daily + combination therapy  4 = SAE:    Requires emergency therapy and hospitalization    Risk Factor Analysis of Anaphylactic Reactions in Patients With Systemic Mastocytosis  Theo G??len J Allergy Clin Immunol Pract . Sep-Oct 2017;5(5):1248-1255.  This was a relatively small study of about 150 pts that was not prospectively verified.   Anaphylaxis induced by   Wasp stings (62%)  Idiopathic (31%)  Food/drug: (7%)  General triggers not a problem (heat, exercise, stress, etc)  Model  Female: 1 pt  Lack of Cutaneous findings: 3 pts  Tryptase < 40: 2 pts Yes this right - lower tryptase is associated with higher risk    Atopy: 1 pt  IgE > 15: 3 pts  Score > 3 High prob; PPV 81%; NPV 54%; sens

## 2022-11-01 NOTE — Unmapped (Addendum)
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  Possibly the radial nerve is being compressed.  We need to send you for a nerve conduction study.  Neuro does this and the Physical Medicine and Rehab team does as well.  This will be a referral as well.  We do not think this is mast cell related.    Tryptase is pending; we will follow up on this.  If the tryptase is elevated still, we may change the dose of the ayvakit.    Trial famotidine again - if this continues to be bothersome can stop.  Continue zyrtec  Follow up in two months

## 2022-11-03 LAB — TRYPTASE: TRYPTASE LEVEL: 4.8 ng/mL

## 2022-11-16 NOTE — Unmapped (Signed)
The [facility] has made a third and final attempt to reach this patient to refill the following medication:Ayvakit.      We have left voicemails on the following phone numbers: [phone], have sent a text message to the following phone numbers: [phone], and have sent a Mychart questionnaire..    Dates contacted: 2/7,13,20  Last scheduled delivery: 10/14/22    The patient may be at risk of non-compliance with this medication. The patient should call the [facility] at [phone]  Option 4, then Option 1 (oncology) to refill medication.    [name] Hulda Humphrey   [facility] Specialty Technician

## 2022-12-14 MED FILL — AYVAKIT 25 MG TABLET: ORAL | 30 days supply | Qty: 30 | Fill #3

## 2022-12-14 NOTE — Unmapped (Signed)
[facility] Refill Coordination Note    Beverly Wilson, DOB: 04-04-1986  Phone: [phone] (home)       All above HIPAA information was verified with patient.         12/13/2022     9:10 PM   Specialty Rx Medication Refill Questionnaire   Which Medications would you like refilled and shipped? Ayvakit 25mg    Please list all current allergies: AZO   Have you missed any doses in the last 30 days? Yes   If Yes, please choose the number of missed doses below: 3-5   Have you had any changes to your medication(s) since your last refill? No   How many days remaining of each medication do you have at home? 1   Have you experienced any side effects in the last 30 days? No   Please enter the full address (street address, city, state, zip code) where you would like your medication(s) to be delivered to. [address]   Please specify on which day you would like your medication(s) to arrive. Note: if you need your medication(s) within 3 days, please call the pharmacy to schedule your order at [phone]  12/15/2022   Has your insurance changed since your last refill? No   Would you like a pharmacist to call you to discuss your medication(s)? No   Do you require a signature for your package? (Note: if we are billing Medicare Part B or your order contains a controlled substance, we will require a signature) No         Completed refill call assessment today to schedule patient's medication shipment from the [facility] [phone]).  All relevant notes have been reviewed.       Confirmed patient received a Conservation officer, historic buildings and a Surveyor, mining with first shipment. The patient will receive a drug information handout for each medication shipped and additional FDA Medication Guides as required.         REFERRAL TO PHARMACIST     Referral to the pharmacist: Not needed      [facility]     Shipping address confirmed in Epic.     Delivery Scheduled: Yes, Expected medication delivery date: 12/15/22.     Medication will be delivered via UPS to the prescription address in Epic [place].    Beverly Wilson Beverly Wilson   [facility] Specialty Technician

## 2023-01-11 NOTE — Unmapped (Signed)
[facility] Refill Coordination Note    Specialty Medication(s) to be Shipped:   Hematology/Oncology: Ayvakit    Other medication(s) to be shipped: No additional medications requested for fill at this time     Beverly Wilson, DOB: 12/13/1985  Phone: [phone] (home)       All above HIPAA information was verified with patient.     Was a Nurse, learning disability used for this call? No    Completed refill call assessment today to schedule patient's medication shipment from the [facility] [phone]).  All relevant notes have been reviewed.     Specialty medication(s) and dose(s) confirmed: Regimen is correct and unchanged.   Changes to medications: Sunita reports no changes at this time.  Changes to insurance: No  New side effects reported not previously addressed with a pharmacist or physician: None reported  Questions for the pharmacist: No    Confirmed patient received a Conservation officer, historic buildings and a Surveyor, mining with first shipment. The patient will receive a drug information handout for each medication shipped and additional FDA Medication Guides as required.       DISEASE/MEDICATION-SPECIFIC INFORMATION        N/A    SPECIALTY MEDICATION ADHERENCE     Medication Adherence    Patient reported X missed doses in the last month: 1  Specialty Medication: Ayvakit 25mg   Patient is on additional specialty medications: No  Informant: patient     Were doses missed due to medication being on hold? No    Ayvakit 25 mg: 4 days of medicine on hand       REFERRAL TO PHARMACIST     Referral to the pharmacist: Not needed      [facility]     Shipping address confirmed in Epic.     Delivery Scheduled: Yes, Expected medication delivery date: 01/13/23.     Medication will be delivered via UPS to the prescription address in Epic [place].    [name] [name]   [facility] Specialty Technician

## 2023-01-12 ENCOUNTER — Ambulatory Visit
Admit: 2023-01-12 | Discharge: 2023-01-12 | Payer: PRIVATE HEALTH INSURANCE | Attending: Hematology & Oncology | Primary: Hematology & Oncology

## 2023-01-12 ENCOUNTER — Other Ambulatory Visit: Admit: 2023-01-12 | Discharge: 2023-01-12 | Payer: PRIVATE HEALTH INSURANCE

## 2023-01-12 DIAGNOSIS — D4701 Cutaneous mastocytosis: Principal | ICD-10-CM

## 2023-01-12 LAB — CBC W/ AUTO DIFF
BASOPHILS ABSOLUTE COUNT: 0 10*9/L (ref 0.0–0.1)
BASOPHILS RELATIVE PERCENT: 0.8 %
EOSINOPHILS ABSOLUTE COUNT: 0.1 10*9/L (ref 0.0–0.5)
EOSINOPHILS RELATIVE PERCENT: 1.2 %
HEMATOCRIT: 39.1 % (ref 34.0–44.0)
HEMOGLOBIN: 13.5 g/dL (ref 11.3–14.9)
LYMPHOCYTES ABSOLUTE COUNT: 2.2 10*9/L (ref 1.1–3.6)
LYMPHOCYTES RELATIVE PERCENT: 40.3 %
MEAN CORPUSCULAR HEMOGLOBIN CONC: 34.5 g/dL (ref 32.0–36.0)
MEAN CORPUSCULAR HEMOGLOBIN: 30.6 pg (ref 25.9–32.4)
MEAN CORPUSCULAR VOLUME: 88.8 fL (ref 77.6–95.7)
MEAN PLATELET VOLUME: 8.8 fL (ref 6.8–10.7)
MONOCYTES ABSOLUTE COUNT: 0.5 10*9/L (ref 0.3–0.8)
MONOCYTES RELATIVE PERCENT: 8.4 %
NEUTROPHILS ABSOLUTE COUNT: 2.7 10*9/L (ref 1.8–7.8)
NEUTROPHILS RELATIVE PERCENT: 49.3 %
PLATELET COUNT: 234 10*9/L (ref 150–450)
RED BLOOD CELL COUNT: 4.41 10*12/L (ref 3.95–5.13)
RED CELL DISTRIBUTION WIDTH: 13 % (ref 12.2–15.2)
WBC ADJUSTED: 5.5 10*9/L (ref 3.6–11.2)

## 2023-01-12 LAB — COMPREHENSIVE METABOLIC PANEL
ALBUMIN: 4 g/dL (ref 3.4–5.0)
ALKALINE PHOSPHATASE: 74 U/L (ref 46–116)
ALT (SGPT): 14 U/L (ref 10–49)
ANION GAP: 5 mmol/L (ref 5–14)
AST (SGOT): 16 U/L (ref <=34)
BILIRUBIN TOTAL: 0.4 mg/dL (ref 0.3–1.2)
BLOOD UREA NITROGEN: 9 mg/dL (ref 9–23)
BUN / CREAT RATIO: 11
CALCIUM: 9.2 mg/dL (ref 8.7–10.4)
CHLORIDE: 108 mmol/L — ABNORMAL HIGH (ref 98–107)
CO2: 27 mmol/L (ref 20.0–31.0)
CREATININE: 0.8 mg/dL
EGFR CKD-EPI (2021) FEMALE: >90 mL/min/{1.73_m2} (ref >=60)
GLUCOSE RANDOM: 81 mg/dL (ref 70–179)
POTASSIUM: 3.4 mmol/L (ref 3.4–4.8)
PROTEIN TOTAL: 7 g/dL (ref 5.7–8.2)
SODIUM: 140 mmol/L (ref 135–145)

## 2023-01-12 LAB — LACTATE DEHYDROGENASE: LACTATE DEHYDROGENASE: 156 U/L (ref 120–246)

## 2023-01-12 MED ORDER — AVAPRITINIB 50 MG TABLET
ORAL_TABLET | Freq: Every day | ORAL | 5 refills | 30 days | Status: CP
Start: 2023-01-12 — End: 2023-07-11
  Filled 2023-01-27: qty 30, 30d supply, fill #0

## 2023-01-12 MED FILL — AYVAKIT 25 MG TABLET: ORAL | 30 days supply | Qty: 30 | Fill #4

## 2023-01-12 NOTE — Unmapped (Unsigned)
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 with MP lesions with an increased number of mast cells in both her skin and bone marrow.  Like many patients wit mast cell disorders, she does not fit one diagnosis perfectly.  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  80% of patients with MPCM have systemic mastocytosis (SM) or will develop it.  Ms Kimbrell is very close to fulfilling all 3 of the 4 minor criteria.  (She would meet the criteria IF her tryptase was > 20).  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     She has had improvement since starting avapritinib as recorded in her AFFIRM score.  In addition, there has been a decrease in her tryptase.  However, there continues to be a number of ongoing symptoms including: whole body itching, fibromyalgia-like pain, and exercise intolerance.  She also has weakness in her left hand (as described previously).  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    I would do the following:     PLAN:  1) Antihistamines: She should increase her anti-histamine therapy  She should take one of the following at least BID: Zyrtec, Xyzal, Allegra, or Claritin.    These can be increased to 4 x a day.    She should take Pepcid: 2 x day.  (20 to 40 mg)   2) Other anti-itch strategies:   Maintain hydration (e.g. [place]der[place] Smooth)  Avoid soaps except pits/pvts   Bleach Baths: 20 minutes in a tepid water with 1/2 cup of bleach.   3) Increase avapritinib to 50 mg.   4) Testing with his next visit  Vitamin D, B12, ferritin  TSH, TBG  Alpha-Gal  Tryptase  CMP, CBC/D  5) Other treatments to consider:   Venlafaxine - this is helpful for flushing  Ketotifen: 1-2 mg BID to 2-4 mg QID - This may be helpful for CNS sx  Disodium Cromoglycate: 200 TID-QID - this is helpful for GI sx.  It can also be compounded into an emulsion for the skin and a nebulizer for respiratory sx  Xolair - This is an effective treatment for several of MC sx. However, it is parenterally administered.   Quercetin: 500 mg BID to 1000 twice a day.   I agree with consideration of Cymbalta, gabapentin, baclofen.   6) RTC in 3 months    Surgery Recommendations   Prednisone 50 mg on the morning of and 50 mg if there are continued sx.   30 to 60 minutes pre-op  H1/H2 blockade (certerizine, famotidine)   Benzodiazepine (there is data suggesting anxiety adds to mast cell activation)  Standard DVT prophylaxis  Operative  Avoid Atracuronium and rocuronium  Succinylcholine, cis-atracurium are safest  Avoid Morphine, codeine  Fentanyl is safer   Avoid cold temperatures    HEME HX:  2013: She developed skin lesions on her thigh; these were noticeable when exposed to  in hot water or exercise.  With exercise, she noted getting flushed and having some wheezing    2022: Develops MP on her trunk and arms. She notes multiple flushing episodes a  day.   04/15/22: Presents to a dermatologist as her skin lesions begin to proliferate.  She undergoes a skin biopsy showing:   An atypical mast cell infiltrate is present in the upper dermis  MC are positive for CD117 and MCT but negative for CD2 and CD30.   Overall, it is an atypical infiltrate that is compatible with mastocytosis.   No aberrant antigen expression is identified to confirm that the mast cells are neoplastic and clonal.   04/2022: She was seen by an outside hematologist.  They noted the following:   Intermittent fatigue  Deep bone pain  DOE and chest heaviness  Difficulties focusing  Periodic itching in her lips, tongue, mouth   IBS sx  Tryptase of 17.2  06/29/22: Seen by [facility]; BM Bx:   Tryptase: 13  60% cellular marrow with TLH  5% atypical mast cells by CD117 immunohistochemical stain (See Comment)  Scattered mast cells with atypical features (>50% spindling)   IHC fails to show aberrant CD2 or CD30 antigen expression.  MPN Panel: Negative  KITD816V: Positive  08/17/22: Started avapritinib 25mg  PO daily  11/01/22: Tryptase: 4; began Singulair  01/13/23: Increase avapritinib to 50 mg every day     INEFFECTIVE TX  Lutelolin 400 mg     INTERVAL HX  Ms Mackins comes for FU of her mastocytosis.  We talked about the following:      Pruritus:  This is her most bothersome sx.    It tends to last for a day.   It occurs on more days than not   It effects her whole body  It is not associated with exposures.   The itching is more like a tingling or burning.    She was given solumedrol without significant benefit  [name]  She notes 100% adherence  She denies any significant side effects.   She does feel somewhat better  She notes reacting to pizza  Her lips get tingly.   She got dizzy and felt better with water and cook air  She took benadryl  She feels there has been no change in her spots; no hives  She is unable to exercise; this can cause light-headedness  She notes her wrists get sore and fatigued; she describes some bone pain      Modified AFIRMM questionnaire  Score each subsection separately  In the past 4 weeks ???.   # Handicap Grade description Grd Wt Pts  11/01/22 01/12/23   Cutaneous 1 Pruritus (itching) Not affected  Occasionally (3 or fewer times a week)  Frequently (4 or more times a week)  Frequently and disruptive to daily function  Frequently and disruptive to sleep   0  1  2  3  4 1  2  3  4  5  0  2  6  12  20 6  Mostly back   (Worsened by heat, exercise, feels like hives, spots raise up)   Worse after showers    6 6    2  Flushing Not affected  Occasionally (once or twice a day)  Frequently (3 or more times a day)   Frequently and disruptive to daily function  Frequently and disruptive to sleep   0  1  2  3  4 1  2  3  4  5  0  2  6  12  20 6 12 2    3  Cutaneous mast cell lesions  Not affected  Mild embarrassment to show one's body  Moderate embarrassment to show one's body  Severe embarrassment to show one's body  Body not shown any more 0  1  2  3  4 1  2  3  4  5  0  2  6  12  20  Now on chest   6 0 2    4 Anaphylactic shock, syncope, dizziness Not affected  I have had 1 pre-syncopal event   I have had 2 pre-syncopal events  I have had > 2 pre-syncopal events  I had an episode of anaphylaxis  0  1  2  3  4 1  2  3  4  5  0  2  6  12  20  Shock - first time 2014 [facility] took fire dept came up - [facility] again passed out, to hospital.   Feels like that with macrobid - discolors urine - Surgery - hydrocodone  No stings ([place] outside) 2    0    5 Dizziness with palpitations Not affected  Occasionally (3 or fewer times a week)  Frequently (4 or more times a week)  Frequently and disruptive to daily function  Frequently and disruptive to sleep   0  1  2  3  4 1  2  3  4  5  0  2  6  12  20 2   It happens when warm   This summer was the worst  0 2   GI   6 Aerophagia, eructation  (Burping) Not affected  Occasionally (less than once a day)  Frequently (more than once a day)   Frequently and disruptive to social function  Frequently and disruptive to sleep   0  1  2  3  4 1  2  3  4  5  0  2  6  12  20  0 0 0    7 Nausea, vomiting Not affected  Occasionally (less than once a day)  Frequently (more than once a day)   Frequently and disruptive to social function  Frequently and disruptive to sleep   0  1  2  3  4 1  2  3  4  5  0  2  6  12  20  0 0 0    8 Epigastric pain Not affected  Occasionally (less than once a day)  Frequently (more than once a day)   Frequently and disruptive to social function  Frequently and disruptive to sleep   0  1  2  3  4 1  2  3  4  5  0  2  6  12  20  0 0 0    9 Diarrhea Not affected  1 - 2 in a day  3 - 4 in a day  5 - 8 in a day  9 or more in a day 0  1  2  3  4 1  2  3  4  5  0  2  6  12  20  Go through phases const/diarrhea  Decades  Difficult with defecation   2 to 3 weeks   No colonoscopy  0 0  With itchign and hand     10 Pseudo-occlusive syndrome (distended abdomen, sometimes painful, with need for flatulence without success) Not affected  Occasionally (less than once a day)  Frequently (more than once a day)   Frequently and disruptive to social function  Frequently and disruptive to sleep   0  1  2  3  4 1  2  3  4  5  0  2  6  12  20 2   Somedays it is worse  2 0   General Function 11 Asthenia (fatigue) Not affected  Mild   Moderate  Severe  Intolerable 0  1  2  3  4 1  2  3  4  5  0  2  6  12  20 12  - somedays everything is wrong - over the summer almost every day.   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  Exercise: Some cardio   20% norlmal   2 kids  12   2    13  Social interaction (irritability, resistance to stress) Not affected  Mild difficulties to interact with others   Moderate difficulties to interact with others   Severe difficulties to interact with others   Cannot interact with the others 0  1  2  3  4 1  2  3  4  5  0  2  6  12  20 2   Sometimes - brain fog - forget  2 2    14  Depression Not affected  Mild depression  Moderate depression  Severe depression  Severe depression with suicide attempt 0  1  2  3  4 1  2  3  4  5  0  2  6  12  20  0 2 0    15 Memory loss (ability to remember names or words) Not affected  Mild  Moderate  Severe  Intolerable 0  1  2  3  4 1  2  3  4  5  0  2  6  12  20 6 6  2-6  Shouldn drive   Respiratory 16 Cough Not affected  Occasionally (less than once a day)  Frequently (more than once a day)   Frequently and disruptive to social function  Frequently and disruptive to sleep   0  1  2  3  4 1  2  3  4  5  0  2  6  12  20  0  Occasionally mucusy  0 0    17 Dyspnea, asthma Not affected  Occasionally (less than once a day)  Frequently (more than once a day)   Frequently and impairs function  Permanently disabling  0  1  2  3  4 1  2  3  4  5  0  2  6  12  20  0 2  0  DOE - when its warm in house   Can't cathc = constant in summer     18 Ear/nose/throat inflammation Not affected  Mild (Controlled with PRN medications)  Moderate (Controlled with 1 or 2 daily meds)  Severe (Controlled with > 2 daily meds)  Intolerable (Not controlled) 0  1  2  3  4 1   2  3  4  5  0  2  6  12  20 6   Allergy meds - 1 year   6 0     TOTAL     54 58 20 - 24      Food allergy  Not affected  I avoid < 2 types of food  I avoid 2 to 4 types of food  I avoid 5 to 8 types of food withdrawn  I avoid more than 8 types of food withdrawn 0  1  2  3  4 1  2  3  4  5  0  2  6  12  20 2     Drug allergy  Not affected  I avoid < 2 types of medications  I avoid 2 to 4 types of medications  I avoid 5 to 8 types of medications   I avoid more than 8 types of medications  0  1  2  3  4 1  2  3  4  5  0  2  6  12  20 2      PHYSICAL EXAM:  VS: As recorded in Epic  GENERAL: She appears well   HEENT: OP is clear  LYMPH NODES: No LAN  LUNGS: CTA  COR:RRR  ABD: NTND; no HSM  EXT:No edema  DERM: The number of MP have decreased.      PMHx:   Anemia  Endometriosis  Migraines  Ovarian cyst  Seasonal allergies    PSHx:  CESAREAN SECTION 07/17/2011  UMBILICAL HERNIA REPAIR N/A 03/05/2014    MEDs:   ibuprofen (ADVIL,MOTRIN) 600 MG tablet, Take 1 tablet (600 mg total) by mouth every 6 (six) hours as needed., Disp: 30 tablet, Rfl: 0  Progesterone (PROMETRIUM) 200 MG capsule, Take 200 mg by mouth daily. Takes for 2 weeks every month then stops, Disp: , Rfl:  Multiple Vitamin (MULTIVITAMIN) tablet, Take 1 tablet by mouth daily. (Patient not taking: Reported on 05/05/2022), Disp: , Rfl: :    ALLERGIES:   Azo [Phenazopyridine] Shortness Of Breath and Nausea And Vomiting  Elevated heart rate. Possible seizure.  Other Anaphylaxis  I had local anesthesia at the dentist that made my throat swell Pt reports swelling, pain, blacking-out. Pt reports that she has no reaction to lidocaine  Sulfa Antibiotics Other    FHx: Mother and father overall in good health. Sister has psoriasis and either psoriatic arthritis or rheumatoid arthritis, receiving a biologic treatment. Brother in good health.    GYN HX: 3 pregnancies, 2 living children  Subchorionic hem and placental abruption at 20 weeks; she delivered at 26 wks delivered. The child died within 24 hours     SHx: She is married. 13-year-old son is currently undergoing evaluation for autoimmune condition. 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 No tobacco use. Social EtOH intake.     Systemic Mastocytosis: 1 Major OR 3 minor  Major Criteria: Multifocal dense infiltrates of tryptase- and/or CD117 positive mast cells (?15 mast cells in aggregates) detected in sections of bone marrow and/or other extracutaneous organ(s)  Staining must include tryptase and CD117 (so as not to miss [facility]) She does not meet this criteria  Minor:      (i) > 25% of MC show an abnormal morphology (atypical immature or spindle-shaped) in the bone marrow or extracutaneous organ(s) She meets this criterion     (ii) Any activating KIT mutation in the bone marrow, peripheral blood, or extracutaneous organ She meets this criterion     (  iii) MC in the bone marrow or in another extracutaneous organ that express CD25, CD2 and/or CD30 She does not meet this criterion     (iv) Serum tryptase > 20 ng /mL; (this should be adjusted for hereditary alpha-tryptasemia She is close to this (17.2)  Must rule out AMN - Systemic Mastocytosis with an Associated Myeloid Neoplasm This has been ruled out  The identification of a tyrosine kinase fusion gene with eosinophilia rules on SM This has been ruled out    Subtypes of SM  Bone Marrow Mastocytosis: No cutaneous lesions or B findings; erum tryptase < 125.    Indolent: No B or C findings  Smoldering: 2 of 3 B findings and no C findings (clinical outcome may not be that different from indolent)  Aggressive: 1 or more C findings  Well-differentiated:   Mast Cell Leukemia     Prognosis for SM: Myeloid mutation testing  ASXL1 and SRSF2 have a poorer prognosis  3 or more mutations have a worse prognosis  TET2 acts in synergy with KIT mutations  http://www.nature.com/leu/journal/v29/n5/full/leu20154a.html?foxtrotcallback=true    Multi-lineage KIT mutation suggests a MDS/MPN; thus, PB testing may be more informative   The VAF for KIT is prognostic.  Reduction in KIT to a VAF < 20% is a good prognostic sign for prolonged remission after cytoreductive therapy.      Cutaneous Mastocytosis: 1 Major and 1 minor or 2 minor  Major: Typical skin lesion with Darier's sign  Minor:   (i) Increased number of mast cells on biopsy She meets this criteria   (ii) Activating KIT mutation She meets this criteria    Alternative Diagnostic Criteria:   Major:   (i) D816V mutation on paraffin block  (ii) > 27 MC/HPF   Minor   (i) > 12 MC/HPF   (ii) Interstitial MC   (iii) Clusters and basal pigmentation    Maculopapular cutaneous mastocytosis  Monomorphic  Smaller lesions that may or may not itch  Trunk, proximal extremities  80% KITD816V mutated  50% atopy including anaphylaxis  Usually adults  2-6 mm without sharp borders  Most have SM  Dermatographism is considered a poorer prognostic sign  Darier's may be negative if the patient is on anti-histamines  Polymorphic  Variable size, shape, color  Usu kids  Usu head, neck, and extremities    [employer] Prognostic System (MAPS, Pardanani et al 2018)   [email or web address]/article/[email or web address]/[facility]-[email or web address]-prognostic-system-for-mastocytosis    Advanced SM vs ISM/SSM (HR, 4.0; 2 points):    Age >60 years (HR, 2.2; 1 point):     Platelets <150 ?? 109/L (HR, 2.8; 1 point):     Serum ALP above normal range (HR, 2.1; 1 point):    Adverse mutations (HR, 2.6; 1 point):     Total    Median Survival in mths by Points  Low:  < 2 pts: NR  Int 1:  3 pts:   85  Int 2: 4 pts:   30  High: > 5 pts  12 mtns     Prognosis for Indolent Systemic Mastocytosis Maryelizabeth Kaufmann et al, Blood 2019)  One point for each   B6m > 2.5 mcg/mL      BM KIT D816V VAF > 1%     ASXL1/RUNX1/DNMT3A > 30%     Progression Free Survival  0 pt: NR (100% at 20 yr)  1 pt:  NR (80% at 20 yr)  2 pt:  10 yrs  3 pt  2 yrs    Prognosis for Systemic Mastocytosis (  Jawhar, 2016)  One point for each  Spleen (>450 ml):    Alk Phos > 150:     5 yr OS  0 pt: 100%  1 pt: 75-80%  2 pts: 10-15%    There are now 5 prognostic systems for SM: REMA, MAPS, IPSM, MARS, GPS: They use WHO defn of adv SM, age > 60, Anemia < 10 or 11; TP < 100 or 150; WBC > 16; inc tryptase, B2M, Alp Phos, KIT VAF, and additional somatic mutations.     Grading Symptoms  Grade Definition  0 = no symptoms:  Prophylaxis only, no specific therapy required  1 = mild, infrequent: Prophylaxis ?? as needed therapy  2 = moderate:  Requires therapy, can usually be kept under control  3 = severe:  suboptimal or unsatisfactory control w/ daily + combination therapy  4 = SAE:    Requires emergency therapy and hospitalization    Risk Factor Analysis of Anaphylactic Reactions in Patients With Systemic Mastocytosis  Theo G??len J Allergy Clin Immunol Pract . Sep-Oct 2017;5(5):1248-1255.  This was a relatively small study of about 150 pts that was not prospectively verified.   Anaphylaxis induced by   Wasp stings (62%)  Idiopathic (31%)  Food/drug: (7%)  General triggers not a problem (heat, exercise, stress, etc)  Model  Female: 1 pt  Lack of Cutaneous findings: 3 pts  Tryptase < 40: 2 pts Yes this right - lower tryptase is associated with higher risk    Atopy: 1 pt  IgE > 15: 3 pts  Score > 3 High prob; PPV 81%; NPV 54%; sens morphology (atypical immature or spindle-shaped) in the bone marrow or extracutaneous organ(s) She meets this criterion     (ii) Any activating KIT mutation in the bone marrow, peripheral blood, or extracutaneous organ She meets this criterion     (iii) MC in the bone marrow or in another extracutaneous organ that express CD25, CD2 and/or CD30 She does not meet this criterion     (iv) Serum tryptase > 20 ng /mL; (this should be adjusted for hereditary alpha-tryptasemia She is close to this (17.2)  Must rule out AMN - Systemic Mastocytosis with an Associated Myeloid Neoplasm This has been ruled out  The identification of a tyrosine kinase fusion gene with eosinophilia rules on SM This has been ruled out    Subtypes of SM  Bone Marrow Mastocytosis: No cutaneous lesions or B findings; erum tryptase < 125.    Indolent: No B or C findings  Smoldering: 2 of 3 B findings and no C findings (clinical outcome may not be that different from indolent)  Aggressive: 1 or more C findings  Well-differentiated:   Mast Cell Leukemia     Prognosis for SM: Myeloid mutation testing  ASXL1 and SRSF2 have a poorer prognosis  3 or more mutations have a worse prognosis  TET2 acts in synergy with KIT mutations  http://www.nature.com/leu/journal/v29/n5/full/leu20154a.html?foxtrotcallback=true    Multi-lineage KIT mutation suggests a MDS/MPN; thus, PB testing may be more informative   The VAF for KIT is prognostic.  Reduction in KIT to a VAF < 20% is a good prognostic sign for prolonged remission after cytoreductive therapy.      Cutaneous Mastocytosis: 1 Major and 1 minor or 2 minor  Major: Typical skin lesion with Darier's sign  Minor:   (i) Increased number of mast cells on biopsy She meets this criteria   (ii) Activating KIT mutation She meets this criteria    Alternative  Diagnostic Criteria:   Major:   (i) D816V mutation on paraffin block  (ii) > 27 MC/HPF   Minor   (i) > 12 MC/HPF   (ii) Interstitial MC   (iii) Clusters and basal pigmentation    Maculopapular cutaneous mastocytosis  Monomorphic  Smaller lesions that may or may not itch  Trunk, proximal extremities  80% KITD816V mutated  50% atopy including anaphylaxis  Usually adults  2-6 mm without sharp borders  Most have SM  Dermatographism is considered a poorer prognostic sign  Darier's may be negative if the patient is on anti-histamines  Polymorphic  Variable size, shape, color  Usu kids  Usu head, neck, and extremities    [employer] Prognostic System (MAPS, Pardanani et al 2018)   https://[email or web address]/bloodadvances/article/[email or web address]/[facility]-[email or web address]-prognostic-system-for-mastocytosis    Advanced SM vs ISM/SSM (HR, 4.0; 2 points):    Age >60 years (HR, 2.2; 1 point):     Platelets <150 ?? 109/L (HR, 2.8; 1 point):     Serum ALP above normal range (HR, 2.1; 1 point):    Adverse mutations (HR, 2.6; 1 point):     Total    Median Survival in mths by Points  Low:  < 2 pts: NR  Int 1:  3 pts:   85  Int 2: 4 pts:   30  High: > 5 pts  12 mtns     Prognosis for Indolent Systemic Mastocytosis Maryelizabeth Kaufmann et al, Blood 2019)  One point for each   B33m > 2.5 mcg/mL      BM KIT D816V VAF > 1%     ASXL1/RUNX1/DNMT3A > 30%     Progression Free Survival  0 pt: NR (100% at 20 yr)  1 pt:  NR (80% at 20 yr)  2 pt:  10 yrs  3 pt  2 yrs    Prognosis for Systemic Mastocytosis (Jawhar, 2016)  One point for each  Spleen (>450 ml):    Alk Phos > 150:     5 yr OS  0 pt: 100%  1 pt: 75-80%  2 pts: 10-15%    There are now 5 prognostic systems for SM: REMA, MAPS, IPSM, MARS, GPS: They use WHO defn of adv SM, age > 60, Anemia < 10 or 11; TP < 100 or 150; WBC > 16; inc tryptase, B2M, Alp Phos, KIT VAF, and additional somatic mutations.     Grading Symptoms  Grade Definition  0 = no symptoms:  Prophylaxis only, no specific therapy required  1 = mild, infrequent: Prophylaxis ?? as needed therapy  2 = moderate:  Requires therapy, can usually be kept under control  3 = severe:  suboptimal or unsatisfactory control w/ daily + combination therapy  4 = SAE:    Requires emergency therapy and hospitalization    Risk Factor Analysis of Anaphylactic Reactions in Patients With Systemic Mastocytosis  Theo G??len J Allergy Clin Immunol Pract . Sep-Oct 2017;5(5):1248-1255.  This was a relatively small study of about 150 pts that was not prospectively verified.   Anaphylaxis induced by   Wasp stings (62%)  Idiopathic (31%)  Food/drug: (7%)  General triggers not a problem (heat, exercise, stress, etc)  Model  Female: 1 pt  Lack of Cutaneous findings: 3 pts  Tryptase < 40: 2 pts Yes this right - lower tryptase is associated with higher risk    Atopy: 1 pt  IgE > 15: 3 pts  Score > 3 High prob; PPV 81%; NPV 54%; sens

## 2023-01-12 NOTE — Unmapped (Addendum)
PLAN:  1) Antihistamines:   - You should take at least 2 x a day one of the following: Zyrtec, Xyzal, Allegra, or Claritin.      - You can take these up to 4 x day.    - You should take Pepcid: 2 x day.  (20 to 40 mg)   2) There are other anti-itch    - Skin is well hydrated: Udderly Smooth   - Avoid soaps except pits/pvts    - Bleach Baths: 20 minutes in a tepid water with 1/2 cup of bleach.   3) I would increase the avapritinib 50 mg.   4) Quercetin: 500 mg BID to 1000 twice a day.  It is a flavanoid.   5) I will see you in 3 months.     Overall, there has been significant improvement. You have had some spells. I would like to see you exercise ...     Options:  1) To continue as is ... And we should see some more improvement.    - I would increase your anti-histamines.   2) Increase the avapritinib.  You are at 25 mg.  An increase to 50 mg a minor change.  This can make the cognitive issues worse.   3) Xolair:  This is an injection; it is given once every 2 to 4 weeks. There is a anaphylaxis risk - it is low. It tends to be worse with the first injection.  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     All lab results last 24 hours:    Recent Results (from the past 24 hour(s))   Comprehensive Metabolic Panel    Collection Time: 01/12/23  3:00 PM   Result Value Ref Range    Sodium 140 135 - 145 mmol/L    Potassium 3.4 3.4 - 4.8 mmol/L    Chloride 108 (H) 98 - 107 mmol/L    CO2 27.0 20.0 - 31.0 mmol/L    Anion Gap 5 5 - 14 mmol/L    BUN 9 9 - 23 mg/dL    Creatinine 1.61 0.96 - 1.02 mg/dL    BUN/Creatinine Ratio 11     eGFR CKD-EPI (2021) Female >90 >=60 mL/min/1.69m2    Glucose 81 70 - 179 mg/dL    Calcium 9.2 8.7 - 04.5 mg/dL    Albumin 4.0 3.4 - 5.0 g/dL    Total Protein 7.0 5.7 - 8.2 g/dL    Total Bilirubin 0.4 0.3 - 1.2 mg/dL    AST 16 <=40 U/L    ALT 14 10 - 49 U/L    Alkaline Phosphatase 74 46 - 116 U/L   Lactate dehydrogenase    Collection Time: 01/12/23  3:00 PM   Result Value Ref Range    LDH 156 120 - 246 U/L   CBC w/ Differential    Collection Time: 01/12/23  3:00 PM   Result Value Ref Range    WBC 5.5 3.6 - 11.2 10*9/L    RBC 4.41 3.95 - 5.13 10*12/L    HGB 13.5 11.3 - 14.9 g/dL    HCT 98.1 19.1 - 47.8 %    MCV 88.8 77.6 - 95.7 fL    MCH 30.6 25.9 - 32.4 pg    MCHC 34.5 32.0 - 36.0 g/dL    RDW 29.5 62.1 - 30.8 %    MPV 8.8 6.8 - 10.7 fL    Platelet 234 150 - 450 10*9/L    Neutrophils %  49.3 %    Lymphocytes % 40.3 %    Monocytes % 8.4 %    Eosinophils % 1.2 %    Basophils % 0.8 %    Absolute Neutrophils 2.7 1.8 - 7.8 10*9/L    Absolute Lymphocytes 2.2 1.1 - 3.6 10*9/L    Absolute Monocytes 0.5 0.3 - 0.8 10*9/L    Absolute Eosinophils 0.1 0.0 - 0.5 10*9/L    Absolute Basophils 0.0 0.0 - 0.1 10*9/L

## 2023-01-13 NOTE — Unmapped (Signed)
Clinical Assessment Needed For: Dose Change  Medication: Ayvakit  Last Fill Date/Day Supply: 4-17 / 30  Copay $4  Was previous dose already scheduled to fill: No    Notes to Pharmacist:

## 2023-01-17 LAB — TRYPTASE: TRYPTASE LEVEL: 2.3 ng/mL

## 2023-01-25 NOTE — Unmapped (Signed)
[facility] Refill Coordination Note    Specialty Medication(s) to be Shipped:   avapritinib 50 mg tablet (AYVAKIT)    Other medication(s) to be shipped: No additional medications requested for fill at this time     Beverly Wilson, DOB: May 05, 1986  Phone: [phone] (home)       All above HIPAA information was verified with patient.     Was a Nurse, learning disability used for this call? No    Completed refill call assessment today to schedule patient's medication shipment from the [facility] [phone]).  All relevant notes have been reviewed.     Specialty medication(s) and dose(s) confirmed: Patient reports changes to the regimen as follows: Doctor up the dose to 50mg     Changes to medications: Marcellina reports starting the following medications: 12/1622  Changes to insurance: No  New side effects reported not previously addressed with a pharmacist or physician: None reported  Questions for the pharmacist: No    Confirmed patient received a Conservation officer, historic buildings and a Surveyor, mining with first shipment. The patient will receive a drug information handout for each medication shipped and additional FDA Medication Guides as required.       DISEASE/MEDICATION-SPECIFIC INFORMATION        N/A    SPECIALTY MEDICATION ADHERENCE     Medication Adherence    Patient reported X missed doses in the last month: 0  Specialty Medication: avapritinib 50 mg tablet (AYVAKIT)  Patient is on additional specialty medications: No  Patient is on more than two specialty medications: No  Any gaps in refill history greater than 2 weeks in the last 3 months: no  Demonstrates understanding of importance of adherence: yes              Were doses missed due to medication being on hold? No    avapritinib 50 mg tablet (AYVAKIT): 7 days of medicine on hand       REFERRAL TO PHARMACIST     Referral to the pharmacist: Not needed      [facility]     Shipping address confirmed in Epic.       Delivery Scheduled: Yes, Expected medication delivery date: 01/28/23.     Medication will be delivered via UPS to the prescription address in Epic WAM.    Nesta Kimple Artelia Laroche   [facility] Specialty Technician

## 2023-01-28 MED ORDER — FAMOTIDINE 20 MG TABLET
ORAL_TABLET | Freq: Two times a day (BID) | ORAL | 4 refills | 90 days | Status: CP
Start: 2023-01-28 — End: ?

## 2023-01-28 NOTE — Unmapped (Signed)
Please refill if appropriate.     Most recent clinic visit: 01/12/2023  Next clinic visit: 04/13/2023

## 2023-02-18 NOTE — Unmapped (Signed)
[facility] Clinical Assessment & Refill Coordination Note    JULIE-ANNE POPESCU, DOB: 12/02/1985  Phone: [phone] (home)     All above HIPAA information was verified with patient.     Was a Nurse, learning disability used for this call? No    Specialty Medication(s):   Hematology/Oncology: Ayvakit     Current Outpatient Medications   Medication Sig Dispense Refill    avapritinib (AYVAKIT) 50 mg tablet Take 1 tablet (50 mg total) by mouth daily. Take on an empty stomach, at least 1 hour before or 2 hours after a meal. 30 tablet 5    cetirizine (ZYRTEC) 10 MG tablet Take 1 tablet (10 mg total) by mouth two (2) times a day. 60 tablet 11    EPINEPHrine (EPIPEN) 0.3 mg/0.3 mL injection Inject 0.3 mL (0.3 mg total) into the muscle once for 1 dose. 0.3 mL 2    famotidine (PEPCID) 20 MG tablet TAKE 1 TABLET BY MOUTH TWO TIMES A DAY. 180 tablet 4    fluticasone propionate (FLONASE) 50 mcg/actuation nasal spray 1 spray by Miscellaneous route daily.      ibuprofen 200 mg cap Take 600 mg by mouth.      progesterone (PROMETRIUM) 100 MG capsule TAKE 1 CAPSULE BY MOUTH ON DAYS 1-14 OF CYCLE 30 DAYS       No current facility-administered medications for this visit.        Changes to medications: Lurlean reports no changes at this time.    Allergies   Allergen Reactions    Azo Urinary Tract Defense [Methenamine-Sodium Salicylate] Anaphylaxis    Cranberry Other (See Comments) and Palpitations     Other reaction(s): Other (See Comments)    Other Anaphylaxis     I had local anesthesia at the dentist that made my throat swell Pt reports swelling, pain, blacking-out. Pt reports that she has no reaction to lidocaine    Phenazopyridine Nausea And Vomiting and Shortness Of Breath     Elevated heart rate. Possible seizure.  Elevated heart rate. Possible seizure.  Elevated heart rate. Possible seizure.  Elevated heart rate. Possible seizure.  Elevated heart rate. Possible seizure.  Elevated heart rate. Possible seizure.  Elevated heart rate. Possible seizure.  Elevated heart rate. Possible seizure.      Anesthetics - Amide Type - Select Amino Amides      Local Anesthetics, causes swelling, pain, itching, rhinitis     Sulfa (Sulfonamide Antibiotics) Other (See Comments)     Other reaction(s): Other (See Comments)  Gets bad yeast infection  Gets bad yeast infection  Gets bad yeast infection    Other reaction(s): Other (See Comments)  Gets bad yeast infection       Changes to allergies: No    SPECIALTY MEDICATION ADHERENCE     Ayvakit 50 mg: unsure exactly how man days of medicine on hand has at least 1 week on hand    Are there any concerns with adherence? No    Adherence counseling provided? Not needed    Patient-Reported Symptoms Tracker for Cancer Patients on Oral Chemotherapy     Oral chemotherapy medication name(s): Ayvakit  Dose and frequency: 50 mg once daily  Oral Chemotherapy Start Date:    Baseline? No  Clinic(s) visited: Hematology    Symptom Grouping Question Patient Response   Digestion and Eating Have you felt sick to your stomach? Denies    Had diarrhea? Denies    Constipated? Denies    Not wanting to  eat? Denies    Comments      Sleep and Pain Felt very tired even after you rest? Denies    Pain due to cancer medication or cancer? Denies    Comments     Other Side Effects Numbness or tingling in hands and/or feet? Denies    Felt short of breath? Denies    Mouth or throat Sores? Denies    Rash? Denies    Palmar-plantar erythrodysesthesia syndrome?      Rash - acneiform?      Rash - maculo-papular?      How many days over the past month did your cancer medication or cancer keep you from your normal activities?  Write in number of days, 0-30:  0    Other side effects or things you would like to discuss? initially noticed difficulty sleeping at night when dose increased from 25 mg to 50 mg daily.  Is now taking med in the morning and is able to fall asleep at night    Comments?     Adherence  In the last 30 days, on how many days did you miss at least one dose of any of your [drug name]? Write in number of days, 0-30:  0    What reasons are you having trouble taking your medication [pharmacist: check all that apply]? Specify chemotherapy cycle:        No problems identified    Comments:        Comments       Optional Symptom Tracking Comments:      CLINICAL MANAGEMENT AND INTERVENTION      Clinical Benefit Assessment:    Do you feel the medicine is effective or helping your condition? Yes    Clinical Benefit counseling provided? Not needed    Acute Infection Status:    Acute infections noted within Epic:  No active infections    Patient reported infection: None    Therapy Appropriateness:    Is therapy appropriate and patient progressing towards therapeutic goals? Yes, therapy is appropriate and should be continued    DISEASE/MEDICATION-SPECIFIC INFORMATION      N/A    Is the patient receiving adequate infection prevention treatment? Not applicable    Does the patient have adequate nutritional support? Not applicable    PATIENT SPECIFIC NEEDS     Does the patient have any physical, cognitive, or cultural barriers? No    Is the patient high risk? No    Did the patient require a clinical intervention? No    Does the patient require physician intervention or other additional services (i.e., nutrition, smoking cessation, social work)? No    SOCIAL DETERMINANTS OF HEALTH     At the [facility], we have learned that life circumstances - like trouble affording food, housing, utilities, or transportation can affect the health of many of our patients.   That is why we wanted to ask: are you currently experiencing any life circumstances that are negatively impacting your health and/or quality of life? Patient declined to answer    Social Determinants of Health     Financial Resource Strain: Low Risk  (12/20/2022)    Received from [facility]    Overall Financial Resource Strain (CARDIA)     Difficulty of Paying Living Expenses: Not very hard   Internet Connectivity: Not on file   Food Insecurity: No Food Insecurity (12/20/2022)    Received from [facility]    Hunger Vital Sign  Worried About Programme researcher, broadcasting/film/video in the Last Year: Never true     Ran Out of Food in the Last Year: Never true   Tobacco Use: Low Risk  (12/20/2022)    Received from [facility]    Patient History     Smoking Tobacco Use: Never     Smokeless Tobacco Use: Never     Passive Exposure: Never   Housing/Utilities: Not on file   Alcohol Use: Not At Risk (12/20/2022)    Received from [facility]    AUDIT-C     Frequency of Alcohol Consumption: Monthly or less     Average Number of Drinks: 1 or 2     Frequency of Binge Drinking: Never   Transportation Needs: No Transportation Needs (12/20/2022)    Received from [facility] - Transportation     Lack of Transportation (Medical): No     Lack of Transportation (Non-Medical): No   Substance Use: Not on file   Health Literacy: Not on file   Physical Activity: Insufficiently Active (12/20/2022)    Received from [facility]    Exercise Vital Sign     Days of Exercise per Week: 5 days     Minutes of Exercise per Session: 10 min   Interpersonal Safety: Not on file   Stress: No Stress Concern Present (12/20/2022)    Received from [facility] - Occupational Stress Questionnaire     Feeling of Stress : Not at all   Intimate Partner Violence: Not At Risk (12/20/2022)    Received from [facility]    HITS     Over the last 12 months how often did your partner physically hurt you?: 1     Over the last 12 months how often did your partner insult you or talk down to you?: 1     Over the last 12 months how often did your partner threaten you with physical harm?: 1     Over the last 12 months how often did your partner scream or curse at you?: 1   Depression: Not at risk (12/20/2022)    Received from [facility]    Depression     PHQ-2 Total Score: 0   Social Connections: Socially Integrated (12/20/2022)    Received from [facility]    Social Network     How would you rate your social network (family, work, friends)?: Good participation with social networks     Would you be willing to receive help with any of the needs that you have identified today? Not applicable       SHIPPING     Specialty Medication(s) to be Shipped:   Hematology/Oncology: Ayvakit    Other medication(s) to be shipped: No additional medications requested for fill at this time     Changes to insurance: No    Delivery Scheduled: Yes, Expected medication delivery date: 02/25/23.     Medication will be delivered via UPS to the confirmed prescription address in [facility]    The patient will receive a drug information handout for each medication shipped and additional FDA Medication Guides as required.  Verified that patient has previously received a Conservation officer, historic buildings and a Surveyor, mining.    The patient or caregiver noted above participated in the development of this care plan and knows that they can request review of or adjustments to the care plan at any time.  All of the patient's questions and concerns have been addressed.    Kermit Balo, [facility]   [facility] Specialty Pharmacist

## 2023-02-24 MED FILL — AYVAKIT 50 MG TABLET: ORAL | 30 days supply | Qty: 30 | Fill #1

## 2023-04-06 NOTE — Unmapped (Addendum)
The [facility] has made a third and final attempt to reach this patient to refill the following medication:AYVAKIT 50 mg tablet (avapritinib).      We have left voicemails on the following phone numbers: [phone], have sent a text message to the following phone numbers: [phone] , and have sent a Mychart questionnaire..    Dates contacted: 03/17/23, 03/22/23, 04/05/23 and 07/24/4  Last scheduled delivery: 02/24/23     The patient may be at risk of non-compliance with this medication. The patient should call the [facility] at [phone]  Option 4, then Option 1: Oncology to refill medication.    Ricci Barker   [facility] Specialty Technician

## 2023-04-13 ENCOUNTER — Ambulatory Visit: Admit: 2023-04-13 | Payer: PRIVATE HEALTH INSURANCE | Attending: Hematology & Oncology | Primary: Hematology & Oncology

## 2023-04-13 ENCOUNTER — Ambulatory Visit: Admit: 2023-04-13 | Payer: PRIVATE HEALTH INSURANCE

## 2023-04-26 DIAGNOSIS — D471 Chronic myeloproliferative disease: Principal | ICD-10-CM

## 2023-04-26 DIAGNOSIS — D4702 Systemic mastocytosis: Principal | ICD-10-CM

## 2023-04-26 DIAGNOSIS — D4701 Cutaneous mastocytosis: Principal | ICD-10-CM

## 2023-04-26 NOTE — Unmapped (Signed)
hematology

## 2023-04-26 NOTE — Unmapped (Signed)
External Referral Request     Requestor:     Confirmation of Order Placed: Yes    Confirmation that Patient is Aware: Yes    Priority Request: Routine (within 72 hours)     Preferred Facility and Location: [facility] / [place], [place]    Preferred Timing Until Referral/Imaging/Testing Appointment: Appointment needed greater than 14 business days     Appointment Request: Referral to be arranged along with first appointment, patient to be notified of referral location and information    Thanks,  Vickey Huger, RN

## 2023-05-06 NOTE — Unmapped (Signed)
Specialty Medication(s): Ayvakit    [name] has been dis-enrolled from the [facility] specialty pharmacy services due to  patient moved to [place]  [facility] unable to deliver to [place] .    Additional information provided to the patient: NA    Kermit Balo, [facility]  [facility] Specialty Pharmacist
